# Patient Record
Sex: Male | Born: 1955
Health system: Southern US, Community
[De-identification: ages and names within clinical notes are randomized; demographics above are authoritative.]

## PROBLEM LIST (undated history)

## (undated) DIAGNOSIS — I2510 Atherosclerotic heart disease of native coronary artery without angina pectoris: Secondary | ICD-10-CM

## (undated) DIAGNOSIS — Z9289 Personal history of other medical treatment: Secondary | ICD-10-CM

## (undated) DIAGNOSIS — I255 Ischemic cardiomyopathy: Secondary | ICD-10-CM

## (undated) DIAGNOSIS — I2109 ST elevation (STEMI) myocardial infarction involving other coronary artery of anterior wall: Secondary | ICD-10-CM

## (undated) DIAGNOSIS — F32A Depression, unspecified: Secondary | ICD-10-CM

## (undated) DIAGNOSIS — K219 Gastro-esophageal reflux disease without esophagitis: Secondary | ICD-10-CM

## (undated) DIAGNOSIS — I10 Essential (primary) hypertension: Secondary | ICD-10-CM

## (undated) DIAGNOSIS — E669 Obesity, unspecified: Secondary | ICD-10-CM

## (undated) DIAGNOSIS — F329 Major depressive disorder, single episode, unspecified: Secondary | ICD-10-CM

## (undated) DIAGNOSIS — I5022 Chronic systolic (congestive) heart failure: Secondary | ICD-10-CM

## (undated) DIAGNOSIS — K859 Acute pancreatitis without necrosis or infection, unspecified: Secondary | ICD-10-CM

## (undated) DIAGNOSIS — E119 Type 2 diabetes mellitus without complications: Secondary | ICD-10-CM

## (undated) DIAGNOSIS — E785 Hyperlipidemia, unspecified: Secondary | ICD-10-CM

## (undated) DIAGNOSIS — I219 Acute myocardial infarction, unspecified: Secondary | ICD-10-CM

## (undated) HISTORY — PX: CORONARY ANGIOPLASTY WITH STENT PLACEMENT: SHX49

## (undated) HISTORY — DX: Acute pancreatitis without necrosis or infection, unspecified: K85.90

## (undated) HISTORY — PX: CARDIAC CATHETERIZATION: SHX172

## (undated) HISTORY — PX: CARDIAC VALVE REPLACEMENT: SHX585

## (undated) HISTORY — DX: Atherosclerotic heart disease of native coronary artery without angina pectoris: I25.10

## (undated) HISTORY — PX: ORCHIECTOMY: SHX2116

## (undated) HISTORY — DX: Obesity, unspecified: E66.9

## (undated) HISTORY — DX: Chronic systolic (congestive) heart failure: I50.22

## (undated) HISTORY — DX: Personal history of other medical treatment: Z92.89

## (undated) HISTORY — PX: LAPAROSCOPIC CHOLECYSTECTOMY: SUR755

## (undated) HISTORY — PX: COLONOSCOPY: SHX174

## (undated) HISTORY — DX: Gastro-esophageal reflux disease without esophagitis: K21.9

## (undated) HISTORY — DX: Hyperlipidemia, unspecified: E78.5

## (undated) HISTORY — DX: Ischemic cardiomyopathy: I25.5

---

## 2004-08-19 ENCOUNTER — Ambulatory Visit: Payer: Self-pay | Admitting: Internal Medicine

## 2004-08-26 ENCOUNTER — Ambulatory Visit: Payer: Self-pay | Admitting: Internal Medicine

## 2004-10-07 ENCOUNTER — Ambulatory Visit: Payer: Self-pay | Admitting: Internal Medicine

## 2004-11-07 ENCOUNTER — Ambulatory Visit: Payer: Self-pay | Admitting: Internal Medicine

## 2004-11-15 ENCOUNTER — Ambulatory Visit: Payer: Self-pay | Admitting: Internal Medicine

## 2008-07-31 DIAGNOSIS — K859 Acute pancreatitis without necrosis or infection, unspecified: Secondary | ICD-10-CM

## 2008-07-31 HISTORY — DX: Acute pancreatitis without necrosis or infection, unspecified: K85.90

## 2008-11-28 DIAGNOSIS — I2109 ST elevation (STEMI) myocardial infarction involving other coronary artery of anterior wall: Secondary | ICD-10-CM

## 2008-11-28 HISTORY — DX: ST elevation (STEMI) myocardial infarction involving other coronary artery of anterior wall: I21.09

## 2009-06-08 ENCOUNTER — Encounter: Admission: RE | Admit: 2009-06-08 | Discharge: 2009-06-08 | Payer: Self-pay | Admitting: Gastroenterology

## 2009-06-20 ENCOUNTER — Inpatient Hospital Stay (HOSPITAL_COMMUNITY): Admission: EM | Admit: 2009-06-20 | Discharge: 2009-06-23 | Payer: Self-pay | Admitting: Emergency Medicine

## 2009-07-13 ENCOUNTER — Ambulatory Visit (HOSPITAL_COMMUNITY): Admission: RE | Admit: 2009-07-13 | Discharge: 2009-07-13 | Payer: Self-pay | Admitting: Gastroenterology

## 2009-07-21 ENCOUNTER — Encounter (INDEPENDENT_AMBULATORY_CARE_PROVIDER_SITE_OTHER): Payer: Self-pay | Admitting: *Deleted

## 2009-07-22 ENCOUNTER — Ambulatory Visit (HOSPITAL_COMMUNITY): Admission: RE | Admit: 2009-07-22 | Discharge: 2009-07-23 | Payer: Self-pay | Admitting: Surgery

## 2009-07-22 ENCOUNTER — Encounter (INDEPENDENT_AMBULATORY_CARE_PROVIDER_SITE_OTHER): Payer: Self-pay | Admitting: Surgery

## 2009-07-23 ENCOUNTER — Emergency Department (HOSPITAL_COMMUNITY): Admission: EM | Admit: 2009-07-23 | Discharge: 2009-07-23 | Payer: Self-pay | Admitting: Emergency Medicine

## 2010-03-14 ENCOUNTER — Telehealth: Payer: Self-pay | Admitting: Internal Medicine

## 2010-08-30 NOTE — Progress Notes (Signed)
Summary: Schedule Colonoscopy  Phone Note Outgoing Call Call back at [facility] Phone [phone]   Call placed by: Harlow Mares CMA [facility]),  March 14, 2010 11:41 AM Call placed to: Patient Summary of Call: Patients number is disconnected, we will mail them a letter to remind them they are due for their procedure and they need to call back and schedule.   Initial call taken by: Harlow Mares CMA [facility]),  March 14, 2010 11:41 AM

## 2010-10-31 LAB — DIFFERENTIAL
Basophils Relative: 1 % (ref 0–1)
Eosinophils Absolute: 0 10*3/uL (ref 0.0–0.7)
Eosinophils Relative: 0 % (ref 0–5)
Monocytes Absolute: 1.1 10*3/uL — ABNORMAL HIGH (ref 0.1–1.0)
Monocytes Relative: 7 % (ref 3–12)
Neutrophils Relative %: 80 % — ABNORMAL HIGH (ref 43–77)

## 2010-10-31 LAB — COMPREHENSIVE METABOLIC PANEL
AST: 25 U/L (ref 0–37)
Albumin: 3.9 g/dL (ref 3.5–5.2)
Chloride: 105 mEq/L (ref 96–112)
Creatinine, Ser: 0.86 mg/dL (ref 0.4–1.5)
GFR calc Af Amer: >60 mL/min (ref >60)
Potassium: 4.2 mEq/L (ref 3.5–5.1)
Total Bilirubin: 0.7 mg/dL (ref 0.3–1.2)
Total Protein: 7.1 g/dL (ref 6.0–8.3)

## 2010-10-31 LAB — URINALYSIS, ROUTINE W REFLEX MICROSCOPIC
Glucose, UA: NEGATIVE mg/dL
Protein, ur: NEGATIVE mg/dL
pH: 6 (ref 5.0–8.0)

## 2010-10-31 LAB — CBC
HCT: 42.3 % (ref 39.0–52.0)
Hemoglobin: 14.1 g/dL (ref 13.0–17.0)
MCHC: 33.4 g/dL (ref 30.0–36.0)
MCV: 90.1 fL (ref 78.0–100.0)
RBC: 4.69 MIL/uL (ref 4.22–5.81)

## 2010-10-31 LAB — BASIC METABOLIC PANEL
Chloride: 107 mEq/L (ref 96–112)
GFR calc Af Amer: >60 mL/min (ref >60)
Potassium: 5 mEq/L (ref 3.5–5.1)

## 2010-11-01 LAB — COMPREHENSIVE METABOLIC PANEL
Albumin: 3.6 g/dL (ref 3.5–5.2)
Alkaline Phosphatase: 76 U/L (ref 39–117)
BUN: 12 mg/dL (ref 6–23)
CO2: 26 mEq/L (ref 19–32)
Chloride: 107 mEq/L (ref 96–112)
GFR calc Af Amer: >60 mL/min (ref >60)
GFR calc non Af Amer: >60 mL/min (ref >60)
Glucose, Bld: 123 mg/dL — ABNORMAL HIGH (ref 70–99)
Potassium: 3.7 mEq/L (ref 3.5–5.1)
Total Bilirubin: 0.8 mg/dL (ref 0.3–1.2)

## 2010-11-01 LAB — DIFFERENTIAL
Basophils Absolute: 0.1 10*3/uL (ref 0.0–0.1)
Basophils Relative: 1 % (ref 0–1)
Monocytes Absolute: 0.5 10*3/uL (ref 0.1–1.0)
Neutro Abs: 2.9 10*3/uL (ref 1.7–7.7)
Neutrophils Relative %: 53 % (ref 43–77)

## 2010-11-01 LAB — TYPE AND SCREEN: ABO/RH(D): O POS

## 2010-11-01 LAB — CBC
MCHC: 33.9 g/dL (ref 30.0–36.0)
MCV: 89.1 fL (ref 78.0–100.0)
Platelets: 195 10*3/uL (ref 150–400)
RDW: 13.5 % (ref 11.5–15.5)

## 2010-11-02 LAB — COMPREHENSIVE METABOLIC PANEL
ALT: 21 U/L (ref 0–53)
AST: 17 U/L (ref 0–37)
AST: 17 U/L (ref 0–37)
AST: 33 U/L (ref 0–37)
BUN: 4 mg/dL — ABNORMAL LOW (ref 6–23)
BUN: 4 mg/dL — ABNORMAL LOW (ref 6–23)
CO2: 24 mEq/L (ref 19–32)
CO2: 25 mEq/L (ref 19–32)
CO2: 26 mEq/L (ref 19–32)
CO2: 26 mEq/L (ref 19–32)
Calcium: 8 mg/dL — ABNORMAL LOW (ref 8.4–10.5)
Calcium: 8.1 mg/dL — ABNORMAL LOW (ref 8.4–10.5)
Calcium: 8.4 mg/dL (ref 8.4–10.5)
Calcium: 8.5 mg/dL (ref 8.4–10.5)
Chloride: 102 mEq/L (ref 96–112)
Chloride: 108 mEq/L (ref 96–112)
Chloride: 111 mEq/L (ref 96–112)
Creatinine, Ser: 0.73 mg/dL (ref 0.4–1.5)
Creatinine, Ser: 0.78 mg/dL (ref 0.4–1.5)
Creatinine, Ser: 0.81 mg/dL (ref 0.4–1.5)
Creatinine, Ser: 0.88 mg/dL (ref 0.4–1.5)
GFR calc Af Amer: >60 mL/min (ref >60)
GFR calc Af Amer: >60 mL/min (ref >60)
GFR calc Af Amer: >60 mL/min (ref >60)
GFR calc non Af Amer: >60 mL/min (ref >60)
GFR calc non Af Amer: >60 mL/min (ref >60)
GFR calc non Af Amer: >60 mL/min (ref >60)
GFR calc non Af Amer: >60 mL/min (ref >60)
Glucose, Bld: 104 mg/dL — ABNORMAL HIGH (ref 70–99)
Glucose, Bld: 135 mg/dL — ABNORMAL HIGH (ref 70–99)
Glucose, Bld: 91 mg/dL (ref 70–99)
Sodium: 141 mEq/L (ref 135–145)
Total Bilirubin: 0.9 mg/dL (ref 0.3–1.2)
Total Bilirubin: 1.2 mg/dL (ref 0.3–1.2)
Total Bilirubin: 1.2 mg/dL (ref 0.3–1.2)

## 2010-11-02 LAB — CBC
Hemoglobin: 12.6 g/dL — ABNORMAL LOW (ref 13.0–17.0)
MCHC: 33.7 g/dL (ref 30.0–36.0)
MCHC: 34.4 g/dL (ref 30.0–36.0)
MCV: 90.3 fL (ref 78.0–100.0)
MCV: 90.8 fL (ref 78.0–100.0)
RBC: 4.07 MIL/uL — ABNORMAL LOW (ref 4.22–5.81)
RBC: 4.75 MIL/uL (ref 4.22–5.81)
WBC: 8.9 10*3/uL (ref 4.0–10.5)

## 2010-11-02 LAB — LIPID PANEL
Cholesterol: 113 mg/dL (ref 0–200)
HDL: 38 mg/dL — ABNORMAL LOW (ref >39)
LDL Cholesterol: 66 mg/dL (ref 0–99)
Triglycerides: 47 mg/dL (ref <150)

## 2010-11-02 LAB — LIPASE, BLOOD
Lipase: 59 U/L (ref 11–59)
Lipase: 614 U/L — ABNORMAL HIGH (ref 11–59)

## 2010-11-02 LAB — DIFFERENTIAL
Lymphocytes Relative: 8 % — ABNORMAL LOW (ref 12–46)
Monocytes Absolute: 0.4 10*3/uL (ref 0.1–1.0)
Monocytes Relative: 3 % (ref 3–12)
Neutro Abs: 11 10*3/uL — ABNORMAL HIGH (ref 1.7–7.7)
Neutrophils Relative %: 88 % — ABNORMAL HIGH (ref 43–77)

## 2011-03-16 IMAGING — RF DG ERCP WO/W SPHINCTEROTOMY
2 series · 6 of 6 positions shown · non-contrast
Comparison: CT abdomen of 06/20/2009 and ultrasound of the abdomen
of 06/23/2009

CLINICAL DATA: Possible common bile duct calculi by CT

ERCP with sphincterotomy
Fluoroscopy time of 7.1 minutes
TECHNIQUE: Multiple spot images obtained with the fluoroscopic
device and submitted for interpretation post-procedure.  ERCP was
performed by Dr. Tho.

[Series 1: cont. · 2 of 2 slices shown (1 of 2)]
[im 1/2]
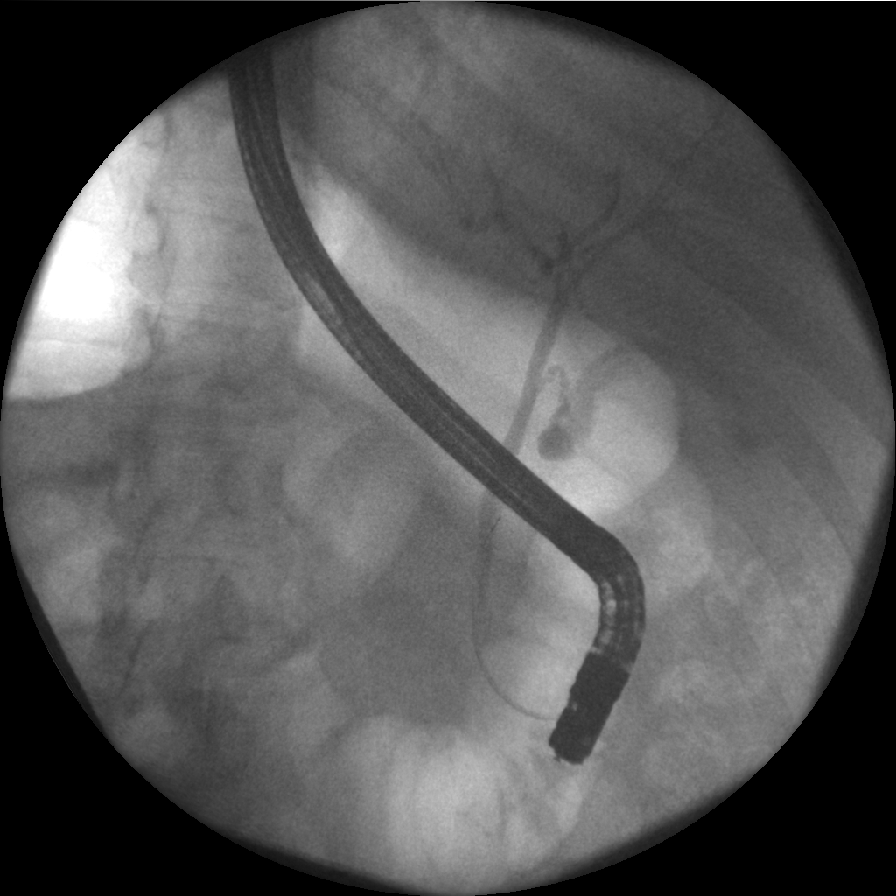
[im 2/2]
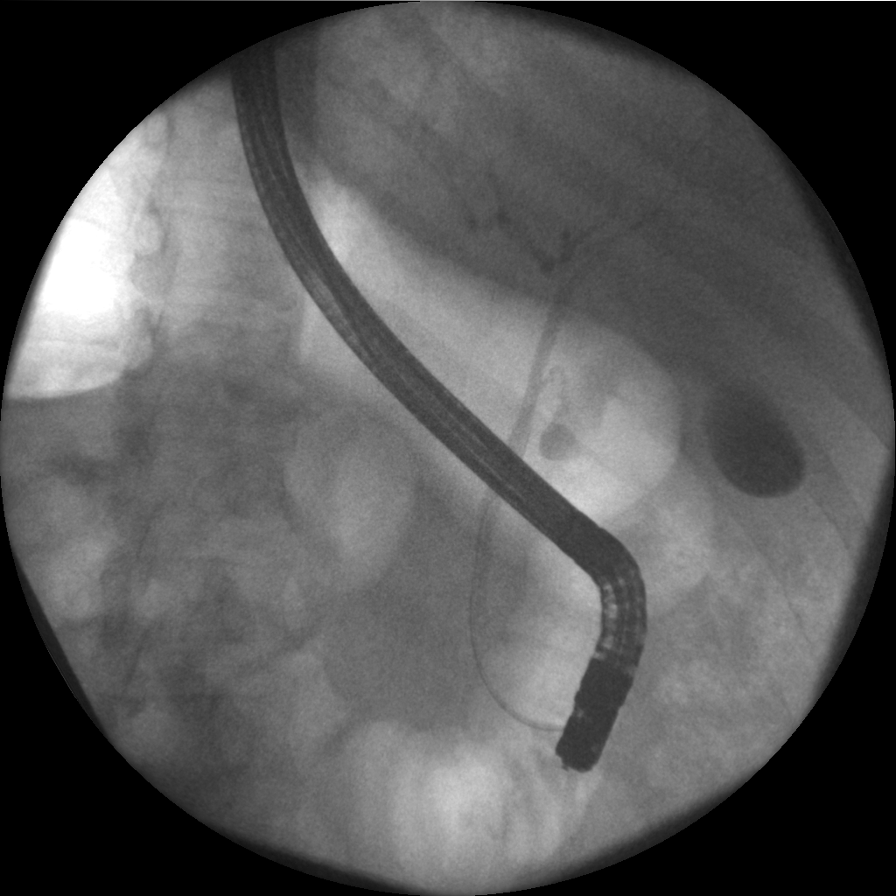

[Series 2: cont. · 4 of 4 slices shown (2 of 2)]
[im 1/4]
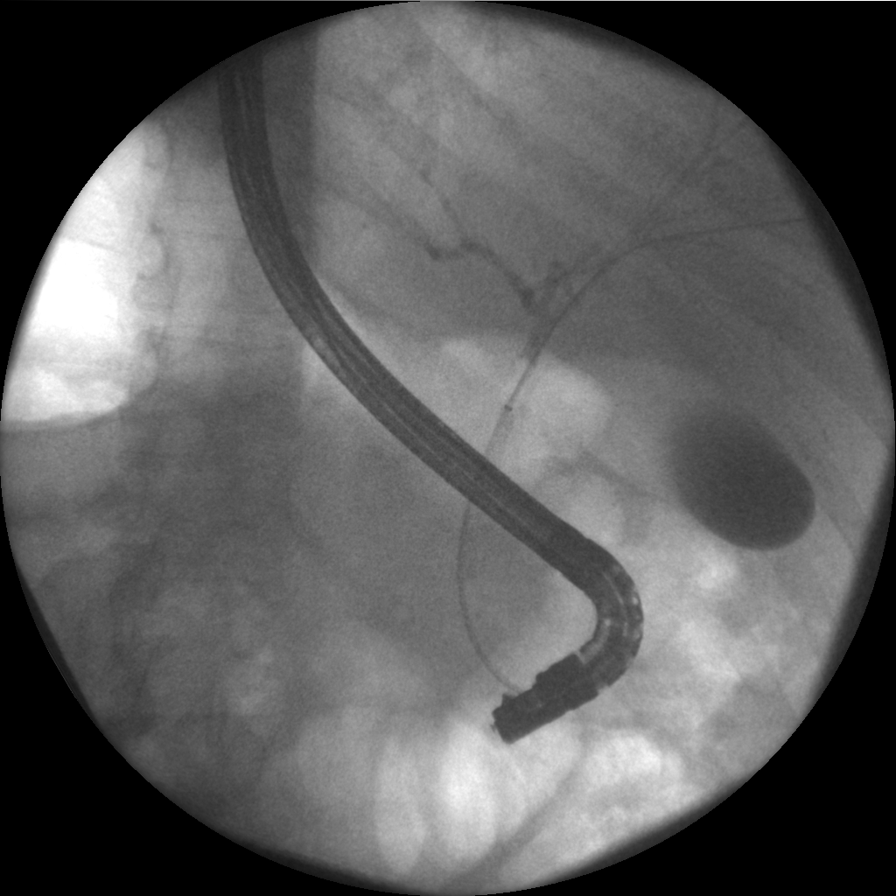
[im 2/4]
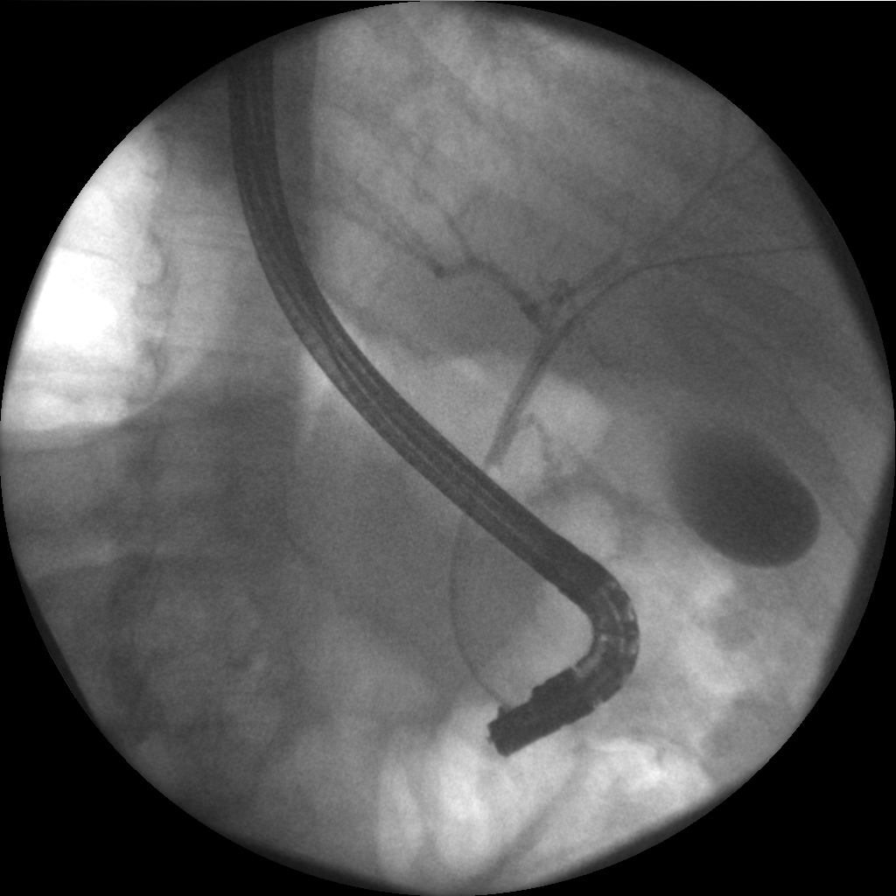
[im 3/4]
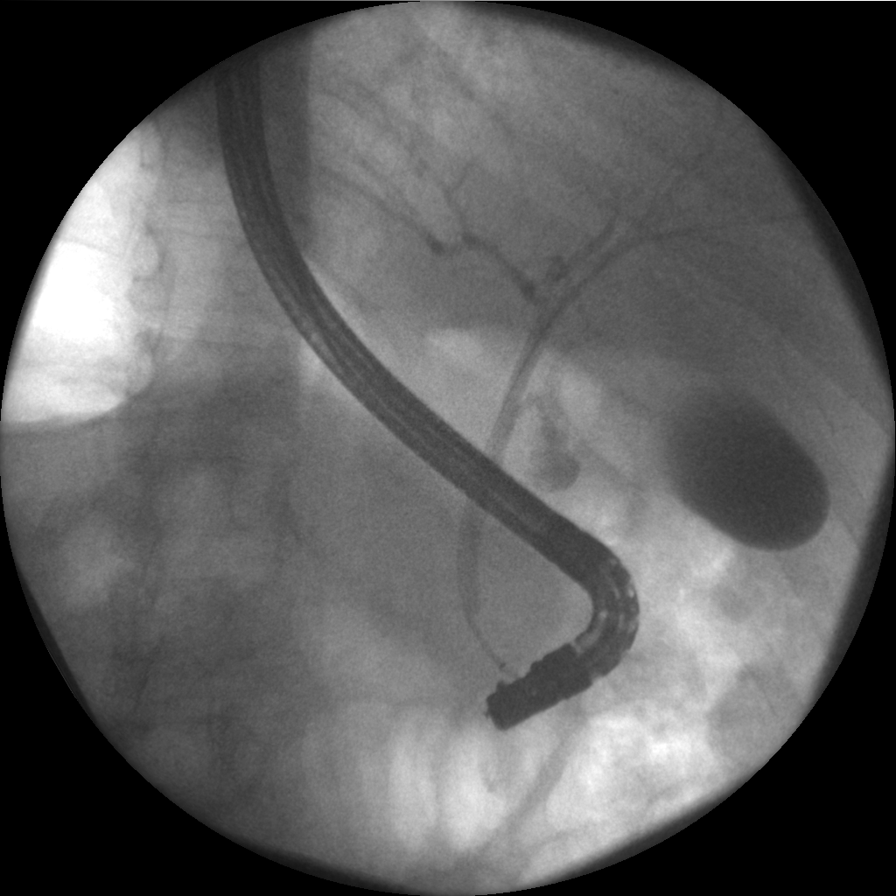
[im 4/4]
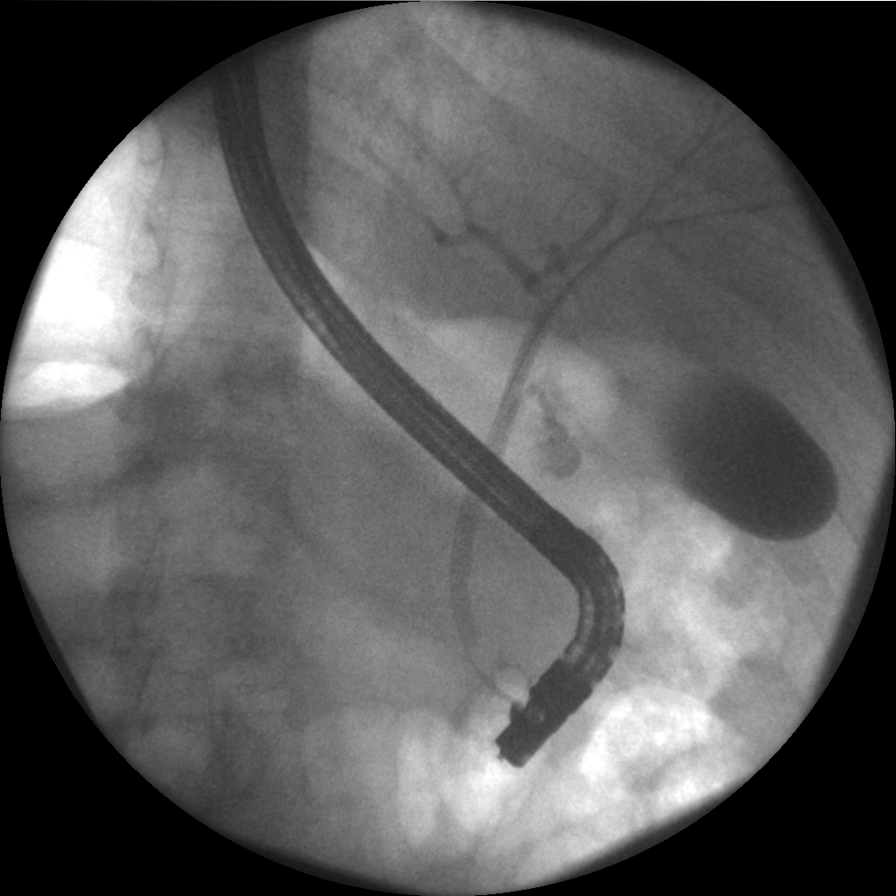

[6 of 6 positions shown; findings below may reference images not displayed]

FINDINGS: ERCP was performed and the common bile duct was
cannulated.  Contrast was injected.  No filling defect was noted.
Sphincterotomy was performed with balloon pull-through.
IMPRESSION: ERCP with sphincterotomy performed.  No definite calculi.

These images were submitted for radiologic interpretation only.
Please see the procedural report for the amount of contrast and the
fluoroscopy time utilized.

## 2012-11-08 DIAGNOSIS — N529 Male erectile dysfunction, unspecified: Secondary | ICD-10-CM | POA: Insufficient documentation

## 2012-11-08 DIAGNOSIS — R635 Abnormal weight gain: Secondary | ICD-10-CM | POA: Insufficient documentation

## 2013-01-01 ENCOUNTER — Encounter: Payer: Self-pay | Admitting: *Deleted

## 2013-01-01 ENCOUNTER — Ambulatory Visit (INDEPENDENT_AMBULATORY_CARE_PROVIDER_SITE_OTHER): Payer: BC Managed Care – PPO | Admitting: Cardiology

## 2013-01-01 ENCOUNTER — Encounter: Payer: Self-pay | Admitting: Cardiology

## 2013-01-01 VITALS — BP 120/80 | HR 69 | Wt 275.0 lb

## 2013-01-01 DIAGNOSIS — K859 Acute pancreatitis without necrosis or infection, unspecified: Secondary | ICD-10-CM | POA: Insufficient documentation

## 2013-01-01 DIAGNOSIS — E785 Hyperlipidemia, unspecified: Secondary | ICD-10-CM | POA: Insufficient documentation

## 2013-01-01 DIAGNOSIS — I2510 Atherosclerotic heart disease of native coronary artery without angina pectoris: Secondary | ICD-10-CM

## 2013-01-01 DIAGNOSIS — I208 Other forms of angina pectoris: Secondary | ICD-10-CM | POA: Insufficient documentation

## 2013-01-01 DIAGNOSIS — E669 Obesity, unspecified: Secondary | ICD-10-CM | POA: Insufficient documentation

## 2013-01-01 DIAGNOSIS — R079 Chest pain, unspecified: Secondary | ICD-10-CM | POA: Insufficient documentation

## 2013-01-01 MED ORDER — ROSUVASTATIN CALCIUM 40 MG PO TABS: 40.0000 mg | ORAL_TABLET | Freq: Every day | ORAL | Status: DC

## 2013-01-01 NOTE — Progress Notes (Signed)
  HPI: 57 year-old male for evaluation of coronary artery disease. Previously followed in [facility]. 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 He had a drug-eluting stent to his LAD in May of 2010. He apparently had nausea and vomiting and did not keep his Plavix down. He had stent thrombosis requiring a second intervention. Cardiac catheterization in September of 2010 showed a patent LAD stent. He had an ostial 40% LAD, a 70% small diagonal and an 80% small ramus. Ejection fraction 55%. Treated medically. Echocardiogram in May of 2010 showed an ejection fraction of 50% and trace tricuspid regurgitation. Patient now presents to establish. He denies dyspnea on exertion, orthopnea, PND, pedal edema, palpitations, syncope or chest pain.  Current Outpatient Prescriptions  Medication Sig Dispense Refill  . aspirin 81 MG tablet Take 81 mg by mouth daily.      . busPIRone (BUSPAR) 15 MG tablet Take 1 tablet by mouth daily.      . citalopram (CELEXA) 40 MG tablet Take 1 tablet by mouth daily. \      . CRESTOR 10 MG tablet Take 1 tablet by mouth daily.      Marland Kitchen EFFIENT 10 MG TABS Take 1 tablet by mouth daily.      . ramipril (ALTACE) 5 MG capsule Take 2 capsules by mouth daily.       No current facility-administered medications for this visit.    No Known Allergies  Past Medical History  Diagnosis Date  . CAD (coronary artery disease)   . Hyperlipidemia   . Obese   . Pancreatitis     Past Surgical History  Procedure Laterality Date  . Orchiectomy    . Cholecystectomy      History   Social History  . Marital Status: Married    Spouse Name: N/A    Number of Children: 2  . Years of Education: N/A   Occupational History  . Not on file.   Social History Main Topics  . Smoking status: Former Games developer  . Smokeless tobacco: Not on file  . Alcohol Use: Yes     Comment: Occasional  . Drug Use: No  . Sexually Active: Not on file   Other  Topics Concern  . Not on file   Social History Narrative  . No narrative on file    Family History  Problem Relation Age of Onset  . CAD Father     MI at age 11  . CAD Brother     MI at age 60    ROS: no fevers or chills, productive cough, hemoptysis, dysphasia, odynophagia, melena, hematochezia, dysuria, hematuria, rash, seizure activity, orthopnea, PND, pedal edema, claudication. Remaining systems are negative.  Physical Exam:   Blood pressure 120/80, pulse 69, weight 275 lb (124.739 kg).  General:  Well developed/well nourished in NAD Skin warm/dry Patient not depressed No peripheral clubbing Back-normal HEENT-normal/normal eyelids Neck supple/normal carotid upstroke bilaterally; no bruits; no JVD; no thyromegaly chest - CTA/ normal expansion CV - RRR/normal S1 and S2; no murmurs, rubs or gallops;  PMI nondisplaced Abdomen -NT/ND, no HSM, no mass, + bowel sounds, no bruit 2+ femoral pulses, no bruits Ext-no edema, chords, 2+ DP Neuro-grossly nonfocal  ECG sinus rhythm at a rate of 69. Prior anterior and inferior infarct.

## 2013-01-01 NOTE — Assessment & Plan Note (Addendum)
Continue aspirin and statin. It has been 4 years since his last stent was placed. Discontinue effient. Schedule Myoview for risk stratification. Continue ACE inhibitor. Check potassium and renal function.

## 2013-01-01 NOTE — Patient Instructions (Addendum)
Your physician wants you to follow-up in: ONE YEAR WITH DR Shelda Pal will receive a reminder letter in the mail two months in advance. If you don't receive a letter, please call our office to schedule the follow-up appointment.   STOP EFFIENT  INCREASE CRESTOR TO 40 MG ONCE DAILY  Your physician recommends that you return for lab work in: 6 WEEKS PRIOR TO EATING  Your physician has requested that you have en exercise stress myoview. For further information please visit [email or web address] Please follow instruction sheet, as given.

## 2013-01-01 NOTE — Assessment & Plan Note (Signed)
Change Crestor to 40 mg daily. Check lipids and liver in 6 weeks.

## 2013-01-04 DIAGNOSIS — F4329 Adjustment disorder with other symptoms: Secondary | ICD-10-CM | POA: Insufficient documentation

## 2013-02-10 ENCOUNTER — Other Ambulatory Visit: Payer: BC Managed Care – PPO

## 2013-02-10 ENCOUNTER — Encounter (HOSPITAL_COMMUNITY): Payer: BC Managed Care – PPO

## 2013-02-17 ENCOUNTER — Ambulatory Visit: Payer: BC Managed Care – PPO | Admitting: Nurse Practitioner

## 2013-02-17 ENCOUNTER — Telehealth: Payer: Self-pay | Admitting: *Deleted

## 2013-02-17 ENCOUNTER — Ambulatory Visit (INDEPENDENT_AMBULATORY_CARE_PROVIDER_SITE_OTHER): Payer: BC Managed Care – PPO | Admitting: Nurse Practitioner

## 2013-02-17 ENCOUNTER — Encounter: Payer: Self-pay | Admitting: Nurse Practitioner

## 2013-02-17 VITALS — BP 100/68 | HR 68 | Ht 75.0 in | Wt 274.1 lb

## 2013-02-17 DIAGNOSIS — I2510 Atherosclerotic heart disease of native coronary artery without angina pectoris: Secondary | ICD-10-CM

## 2013-02-17 MED ORDER — CARVEDILOL 3.125 MG PO TABS: 3.1250 mg | ORAL_TABLET | Freq: Two times a day (BID) | ORAL | Status: DC

## 2013-02-17 MED ORDER — FUROSEMIDE 20 MG PO TABS: 20.0000 mg | ORAL_TABLET | Freq: Every day | ORAL | Status: DC | PRN

## 2013-02-17 NOTE — Telephone Encounter (Signed)
S/w pt, Pt is coming in at 1:00 pm 7/21

## 2013-02-17 NOTE — Telephone Encounter (Signed)
S/w pt our computers were down and pt was coming in for an ov to discuss myoview, could not pull up test the pt will reschedule. After pulled up records did not see a myoview will send to scheduling to call to r/s. Pt aware and verbally understood

## 2013-02-17 NOTE — Progress Notes (Addendum)
Martin Frank Date of Birth: 25-Oct-1955 Medical Record #[id number]  History of Present Illness: Martin Frank is seen back today for a post hospital visit. Seen for Dr. [name]. Has known CAD  With past anterior MI complicated by VT. Had DES to the LAD in May of 2010.  Was not able to keep his Plavix down due to N/V and had stent thrombosis requiring a second intervention. Last cath in 2010. EF has been normal at 55%. Managed medically since that time.   Seen here last month to establish and was referred for a Myoview. Since his last stent had been placed 4 years prior, his Effient was stopped. His Myoview cancelled due to an admission in [place].   Comes in today. Here alone. He was admitted in [place] on July 8th with chest pain. Had positive enzymes. Was cathed. Had an echo as well. Was told that he did not need stents and that his pumping function was reduced. Was placed on aldactone and had his Plavix restarted. We do not have records. He was kept there for 2 days then released. He has not had any further chest pain. He is a little short of breath.  He is walking daily without issue. Back at work. He notes a dry hacky cough that is new since this last admission. He is on ACE. No swelling. No PND. Not dizzy or lightheaded.   Current Outpatient Prescriptions  Medication Sig Dispense Refill  . aspirin 81 MG tablet Take 81 mg by mouth daily.      . citalopram (CELEXA) 40 MG tablet Take 1 tablet by mouth daily. \      . clopidogrel (PLAVIX) 75 MG tablet Take 75 mg by mouth daily.       . famotidine (PEPCID) 20 MG tablet Take 20 mg by mouth daily.      Marland Kitchen NITROSTAT 0.4 MG SL tablet Place 0.4 mg under the tongue every 5 (five) minutes as needed for chest pain (may repeat X 3 than call 911).       . ramipril (ALTACE) 5 MG capsule Take 2 capsules by mouth daily.      . rosuvastatin (CRESTOR) 40 MG tablet Take 1 tablet (40 mg total) by mouth daily.  30 tablet  12  . spironolactone (ALDACTONE) 25 MG  tablet Take 12.5 mg by mouth daily.       . carvedilol (COREG) 3.125 MG tablet Take 1 tablet (3.125 mg total) by mouth 2 (two) times daily.  60 tablet  3  . furosemide (LASIX) 20 MG tablet Take 1 tablet (20 mg total) by mouth daily as needed. To take prn weight gain of 2 to 3 pounds overnight.  90 tablet  3   No current facility-administered medications for this visit.    No Known Allergies  Past Medical History  Diagnosis Date  . CAD (coronary artery disease)   . Hyperlipidemia   . Obese   . Pancreatitis     Past Surgical History  Procedure Laterality Date  . Orchiectomy    . Cholecystectomy      History  Smoking status  . Former Smoker  Smokeless tobacco  . Not on file    History  Alcohol Use  . Yes    Comment: Occasional    Family History  Problem Relation Age of Onset  . CAD Father     MI at age 72  . CAD Brother     MI at age 63  Review of Systems: The review of systems is per the HPI.  All other systems were reviewed and are negative.  Physical Exam: BP 100/68  Pulse 68  Ht 6\' 3"  (1.905 m)  Wt 274 lb 1.9 oz (124.34 kg)  BMI 34.26 kg/m2 BP by me is 140/100.  Patient is very pleasant and in no acute distress. He is obese. Skin is warm and dry. Color is normal.  HEENT is unremarkable. Normocephalic/atraumatic. PERRL. Sclera are nonicteric. Neck is supple. No masses. No JVD. Lungs are clear. Cardiac exam shows a regular rate and rhythm.No S3 noted.  Abdomen is soft. Extremities are without edema. Gait and ROM are intact. No gross neurologic deficits noted.  LABORATORY DATA: Lab Results  Component Value Date   WBC 16.7* 07/23/2009   HGB 14.1 07/23/2009   HCT 42.3 07/23/2009   PLT 204 07/23/2009   GLUCOSE 110* 07/23/2009   CHOL  Value: 113        ATP III CLASSIFICATION:  <200     mg/dL   Desirable  161-096  mg/dL   Borderline High  >=045    mg/dL   High        40/98/1191   TRIG 47 06/22/2009   HDL 38* 06/22/2009   LDLCALC  Value: 66        Total  Cholesterol/HDL:CHD Risk Coronary Heart Disease Risk Table                     Men   Women  1/2 Average Risk   3.4   3.3  Average Risk       5.0   4.4  2 X Average Risk   9.6   7.1  3 X Average Risk  23.4   11.0        Use the calculated Patient Ratio above and the CHD Risk Table to determine the patient's CHD Risk.        ATP III CLASSIFICATION (LDL):  <100     mg/dL   Optimal  478-295  mg/dL   Near or Above                    Optimal  130-159  mg/dL   Borderline  621-308  mg/dL   High  >657     mg/dL   Very High 84/69/6295   ALT 40 07/23/2009   AST 25 07/23/2009   NA 138 07/23/2009   K 4.2 07/23/2009   CL 105 07/23/2009   CREATININE 0.86 07/23/2009   BUN 11 07/23/2009   CO2 27 07/23/2009     Assessment / Plan: 1. CAD - remote anterior MI with VT -past DES to the LAD - now with recent MI - no reported intervention but with reported LV dysfunction - we do not have any records for review. He has signed a release today. I am starting Coreg 3.125 mg BID. Check BNP and BMET today as well. Will send in a prescription for Lasix 20 mg to use prn weight gain, swelling, etc. I will see him back in 2 weeks. We discussed salt restriction and daily weights - which he is doing.  Probably will need uptitration of his medicines over the next 3 months with plans for a repeat echo and possible ICD referral.   2. HTN - BP is up by my report. I am starting Coreg today.   3. HLD - on statin therapy.   4. Obesity   I will see him  in 2 weeks.   Patient is agreeable to this plan and will call if any problems develop in the interim.   Rosalio Macadamia, RN, ANP-C [facility] [address] [place], [place]  [address]   Addendum: February 19, 2013 Records from [facility] have been received today. Patient had a small NSTEMI with peak troponin of 2.62 likely due to small vessel disease vs spasm. Prior LAD stent was patent and mild to moderate nonobstructive disease otherwise. EF was 35 to 45% per  cath. I do not see an echo report. Aladactone was added during that admission. Beta blocker was not started due to bradycardia - which he did not exhibit at the time of my OV.   I have called for the echo report - we did receive this - it was a technically difficult study - EF was 35 to 40%.

## 2013-02-17 NOTE — Patient Instructions (Addendum)
Stay on your current medicines with the exceptions as below. The plan for you is to get you on target doses of your medicines and repeat an echo in 3 months with possible plan for an ICD.   We need to check labs today   We will get your records and I will share that with Dr. [name]  We are adding Coreg 3.125 mg two times a day  I have sent in a prescription for Lasix 20 mg to just take as needed - weight gain of 3 pounds overnight, swelling etc  Minimize the salt  I will see you in 2 weeks.   Call the [facility] office at [phone] if you have any questions, problems or concerns.

## 2013-02-18 ENCOUNTER — Telehealth: Payer: Self-pay | Admitting: *Deleted

## 2013-02-18 ENCOUNTER — Other Ambulatory Visit (INDEPENDENT_AMBULATORY_CARE_PROVIDER_SITE_OTHER): Payer: BC Managed Care – PPO

## 2013-02-18 ENCOUNTER — Other Ambulatory Visit: Payer: Self-pay | Admitting: *Deleted

## 2013-02-18 DIAGNOSIS — E785 Hyperlipidemia, unspecified: Secondary | ICD-10-CM

## 2013-02-18 DIAGNOSIS — I25810 Atherosclerosis of coronary artery bypass graft(s) without angina pectoris: Secondary | ICD-10-CM

## 2013-02-18 DIAGNOSIS — R0602 Shortness of breath: Secondary | ICD-10-CM

## 2013-02-18 LAB — BASIC METABOLIC PANEL
BUN: 14 mg/dL (ref 6–23)
CO2: 25 mEq/L (ref 19–32)
Calcium: 9.5 mg/dL (ref 8.4–10.5)
Chloride: 105 mEq/L (ref 96–112)
Creatinine, Ser: 1 mg/dL (ref 0.4–1.5)
GFR: 84.02 mL/min (ref >60.00)
Glucose, Bld: 105 mg/dL — ABNORMAL HIGH (ref 70–99)
Potassium: 4.6 mEq/L (ref 3.5–5.1)
Sodium: 137 mEq/L (ref 135–145)

## 2013-02-18 LAB — HEPATIC FUNCTION PANEL
ALT: 38 U/L (ref 0–53)
AST: 26 U/L (ref 0–37)
Albumin: 4.2 g/dL (ref 3.5–5.2)
Alkaline Phosphatase: 71 U/L (ref 39–117)
Bilirubin, Direct: 0 mg/dL (ref 0.0–0.3)
Total Bilirubin: 0.5 mg/dL (ref 0.3–1.2)
Total Protein: 7.4 g/dL (ref 6.0–8.3)

## 2013-02-18 LAB — LIPID PANEL
Cholesterol: 144 mg/dL (ref 0–200)
HDL: 49.1 mg/dL (ref >39.00)
LDL Cholesterol: 75 mg/dL (ref 0–99)
Total CHOL/HDL Ratio: 3
Triglycerides: 98 mg/dL (ref 0.0–149.0)
VLDL: 19.6 mg/dL (ref 0.0–40.0)

## 2013-02-18 LAB — BRAIN NATRIURETIC PEPTIDE: Pro B Natriuretic peptide (BNP): 13 pg/mL (ref 0.0–100.0)

## 2013-02-18 NOTE — Telephone Encounter (Signed)
S/w pt will come in today for lab work the  pt was suppose to get labs drawn yesterday at [facility] because computer were down didn't have insurance card will come in today

## 2013-02-19 ENCOUNTER — Telehealth: Payer: Self-pay | Admitting: Nurse Practitioner

## 2013-02-19 NOTE — Telephone Encounter (Signed)
Records Received From [facility] gave to [employer]  02/19/13/KM

## 2013-03-10 ENCOUNTER — Ambulatory Visit (INDEPENDENT_AMBULATORY_CARE_PROVIDER_SITE_OTHER): Payer: BC Managed Care – PPO | Admitting: Nurse Practitioner

## 2013-03-10 ENCOUNTER — Encounter: Payer: Self-pay | Admitting: Nurse Practitioner

## 2013-03-10 VITALS — BP 110/72 | HR 68 | Ht 75.0 in | Wt 274.0 lb

## 2013-03-10 DIAGNOSIS — I2510 Atherosclerotic heart disease of native coronary artery without angina pectoris: Secondary | ICD-10-CM

## 2013-03-10 LAB — BASIC METABOLIC PANEL
BUN: 14 mg/dL (ref 6–23)
CO2: 21 mEq/L (ref 19–32)
Calcium: 9.2 mg/dL (ref 8.4–10.5)
Chloride: 105 mEq/L (ref 96–112)
Creatinine, Ser: 0.9 mg/dL (ref 0.4–1.5)
GFR: 88.14 mL/min (ref >60.00)
Glucose, Bld: 115 mg/dL — ABNORMAL HIGH (ref 70–99)
Potassium: 4.1 mEq/L (ref 3.5–5.1)
Sodium: 136 mEq/L (ref 135–145)

## 2013-03-10 MED ORDER — LOSARTAN POTASSIUM 50 MG PO TABS: 50.0000 mg | ORAL_TABLET | Freq: Every day | ORAL | Status: DC

## 2013-03-10 NOTE — Patient Instructions (Addendum)
Continue with your current medicines except STOP the ramipril  Start Losartan 50 mg a day (in the place of the ramipril) - this is at the drug store  I will see you in 2 weeks  We will check lab today  Weigh yourself each morning and record.  Take your Lasix for weight gain of 3 pounds in 24 hours.   Limit sodium intake. Goal is to have less than 2000 mg (2gm) of salt per day.  Call the [facility] office at [phone] if you have any questions, problems or concerns.

## 2013-03-10 NOTE — Progress Notes (Signed)
Martin Frank Date of Birth: 1956/03/25 Medical Record #[id number]  History of Present Illness: Martin Frank is seen back today for a 3 week check. Seen for Dr. [name]. Has known CAD with past anterior MI complicated by VT. Had DES to the LAD in May of 2010. Was not able to keep his Plavix down initially due to N/V and had stent thrombosis requiring a second intervention. Last cath in 2010. Ef has been normal at 55%. Managed medically since that time.   Seen back in June to reestablish care. Myoview arranged. Effient was stopped. Then went on to have NSTEMI while in [place]. Peak troponin of 2.62. His cath showed his prior LAD stent to be widely patent with mild to moderate nonobstructive disease otherwise. It was felt he had either a coronary vasospasm event versus potential small vessel disease. Medical management was recommended and he was placed back on Plavix. EF was noted to be 35 to 40%. He was also started on low dose aldactone.   I saw him 3 weeks ago. We added Coreg and prn Lasix.   Comes back today. He is here alone. Doing ok. Still with some cough and a little short of breath. He describes his cough as a dry, hacky, "irritate you to death cough".  Weight is unchanged. Not using any Lasix. No chest pain. Back at the gym 4 to 5 x a week without any issue.    Current Outpatient Prescriptions  Medication Sig Dispense Refill  . aspirin 81 MG tablet Take 81 mg by mouth daily.      . carvedilol (COREG) 3.125 MG tablet Take 1 tablet (3.125 mg total) by mouth 2 (two) times daily.  60 tablet  3  . citalopram (CELEXA) 40 MG tablet Take 1 tablet by mouth daily. \      . clopidogrel (PLAVIX) 75 MG tablet Take 75 mg by mouth daily.       . famotidine (PEPCID) 20 MG tablet Take 20 mg by mouth daily.      . furosemide (LASIX) 20 MG tablet Take 1 tablet (20 mg total) by mouth daily as needed. To take prn weight gain of 2 to 3 pounds overnight.  90 tablet  3  . NITROSTAT 0.4 MG SL tablet Place 0.4  mg under the tongue every 5 (five) minutes as needed for chest pain (may repeat X 3 than call 911).       . rosuvastatin (CRESTOR) 40 MG tablet Take 1 tablet (40 mg total) by mouth daily.  30 tablet  12  . spironolactone (ALDACTONE) 25 MG tablet Take 12.5 mg by mouth daily.       Marland Kitchen losartan (COZAAR) 50 MG tablet Take 1 tablet (50 mg total) by mouth daily.  30 tablet  3   No current facility-administered medications for this visit.    No Known Allergies  Past Medical History  Diagnosis Date  . CAD (coronary artery disease)   . Hyperlipidemia   . Obese   . Pancreatitis     Past Surgical History  Procedure Laterality Date  . Orchiectomy    . Cholecystectomy      History  Smoking status  . Former Smoker  Smokeless tobacco  . Not on file    History  Alcohol Use  . Yes    Comment: Occasional    Family History  Problem Relation Age of Onset  . CAD Father     MI at age 60  . CAD Brother  MI at age 25    Review of Systems: The review of systems is per the HPI.  All other systems were reviewed and are negative.  Physical Exam: BP 110/72  Pulse 68  Ht 6\' 3"  (1.905 m)  Wt 274 lb (124.286 kg)  BMI 34.25 kg/m2 Patient is very pleasant and in no acute distress. Skin is warm and dry. Color is normal.  HEENT is unremarkable. Normocephalic/atraumatic. PERRL. Sclera are nonicteric. Neck is supple. No masses. No JVD. Lungs are clear. Cardiac exam shows a regular rate and rhythm. Abdomen is soft. Extremities are without edema. Gait and ROM are intact. No gross neurologic deficits noted.  LABORATORY DATA: BMET is pending  Lab Results  Component Value Date   WBC 16.7* 07/23/2009   HGB 14.1 07/23/2009   HCT 42.3 07/23/2009   PLT 204 07/23/2009   GLUCOSE 105* 02/18/2013   CHOL 144 02/18/2013   TRIG 98.0 02/18/2013   HDL 49.10 02/18/2013   LDLCALC 75 02/18/2013   ALT 38 02/18/2013   AST 26 02/18/2013   NA 137 02/18/2013   K 4.6 02/18/2013   CL 105 02/18/2013   CREATININE 1.0  02/18/2013   BUN 14 02/18/2013   CO2 25 02/18/2013     Assessment / Plan:  1. CAD with remote anterior MI with VT and past DES to the LAD - recent NSTEMI with associated LV dysfunction - his stent was patent at time of cath - this was felt to be coronary vasospasm versus small vessel disease - no chest pain reported.   2. HTN - BP looks ok. We still have room to titrate medicines  3. LV dysfunction - EF of 35 to 40% - possible ACE cough - will change to Losartan 50 mg. See him back in 2 weeks. Hope to increase Coreg on return and our goal will be to work towards target doses and repeat his echo in about 2 months.   Patient is agreeable to this plan and will call if any problems develop in the interim.   Rosalio Macadamia, RN, ANP-C [facility] [address] [place], [place]  [address]   2. LV dysfunction - EF 35 to 40% per echo at [facility] -

## 2013-03-21 ENCOUNTER — Ambulatory Visit (INDEPENDENT_AMBULATORY_CARE_PROVIDER_SITE_OTHER): Payer: BC Managed Care – PPO | Admitting: Nurse Practitioner

## 2013-03-21 ENCOUNTER — Encounter: Payer: Self-pay | Admitting: Nurse Practitioner

## 2013-03-21 VITALS — BP 110/70 | HR 60 | Ht 75.0 in | Wt 274.8 lb

## 2013-03-21 DIAGNOSIS — I519 Heart disease, unspecified: Secondary | ICD-10-CM

## 2013-03-21 DIAGNOSIS — I2510 Atherosclerotic heart disease of native coronary artery without angina pectoris: Secondary | ICD-10-CM

## 2013-03-21 NOTE — Patient Instructions (Addendum)
Stay on your current medicines for now  See Dr. [name] in 1 month (try for an early Monday am appointment)  Call the [facility] office at [phone] if you have any questions, problems or concerns.

## 2013-03-21 NOTE — Progress Notes (Signed)
Martin Frank Date of Birth: Jul 16, 1956 Medical Record #[id number]  History of Present Illness: Martin Frank is seen back today for a 2 week check. Seen for Dr. [name]. Has known CAD with past anterior MI complicated by VT. Had DES to the LAD in May of 2010. Was not able to keep his Plavix down initially due to N/V and had stent thrombosis requiring a second intervention. Last cath in 2010. EF has been normal at 55%. Managed medically since that time.   Seen back in June to reestablish care. Myoview arranged. Effient was stopped. Then went on to have NSTEMI while in [place]. Peak troponin of 2.62. His cath showed his prior LAD stent to be widely patent with mild to moderate nonobstructive disease otherwise. It was felt he had either a coronary vasospasm event versus potential small vessel disease. Medical management was recommended and he was placed back on Plavix. EF was noted to be 35 to 40%. He was also started on low dose aldactone.   In the interim we have been titrating his medicines up.  Probably had an ACE cough - switched over to ARB.   Comes back today. Here alone. Seems to be doing ok. Did travel over the past weekend - weight was up 7 pounds when he got back - he used lasix x 1 and lost 5 pounds. They did eat out every meal.  Has used the Lasix one other time since here. Not short of breath. Feels fatigued. One day thought "he was going to get dizzy". The heat seems to bother him. No chest pain reported. No swelling. No PND/orthopnea. His cough DID NOT resolve with changing to ARB.  Current Outpatient Prescriptions  Medication Sig Dispense Refill  . aspirin 81 MG tablet Take 81 mg by mouth daily.      . carvedilol (COREG) 3.125 MG tablet Take 1 tablet (3.125 mg total) by mouth 2 (two) times daily.  60 tablet  3  . citalopram (CELEXA) 40 MG tablet Take 1 tablet by mouth daily. \      . clopidogrel (PLAVIX) 75 MG tablet Take 75 mg by mouth daily.       . famotidine (PEPCID) 20 MG  tablet Take 20 mg by mouth daily.      . furosemide (LASIX) 20 MG tablet Take 1 tablet (20 mg total) by mouth daily as needed. To take prn weight gain of 2 to 3 pounds overnight.  90 tablet  3  . losartan (COZAAR) 50 MG tablet Take 1 tablet (50 mg total) by mouth daily.  30 tablet  3  . NITROSTAT 0.4 MG SL tablet Place 0.4 mg under the tongue every 5 (five) minutes as needed for chest pain (may repeat X 3 than call 911).       . rosuvastatin (CRESTOR) 40 MG tablet Take 1 tablet (40 mg total) by mouth daily.  30 tablet  12  . spironolactone (ALDACTONE) 25 MG tablet Take 12.5 mg by mouth daily.        No current facility-administered medications for this visit.    No Known Allergies  Past Medical History  Diagnosis Date  . CAD (coronary artery disease)     remote anterior MI with VT and DES to the LAD in May of 2010; NSTEMI July 2014 in [place] with cath showing stent patency, EF of 35 to 40% and possible vasospasm versus potential small vessel disease.   Marland Kitchen Hyperlipidemia   . Obese   . Pancreatitis   .  LV dysfunction     Past Surgical History  Procedure Laterality Date  . Orchiectomy    . Cholecystectomy      History  Smoking status  . Former Smoker  Smokeless tobacco  . Not on file    History  Alcohol Use  . Yes    Comment: Occasional    Family History  Problem Relation Age of Onset  . CAD Father     MI at age 54  . CAD Brother     MI at age 66    Review of Systems: The review of systems is per the HPI.  All other systems were reviewed and are negative.  Physical Exam: BP 110/70  Pulse 60  Ht 6\' 3"  (1.905 m)  Wt 274 lb 12.8 oz (124.648 kg)  BMI 34.35 kg/m2 Patient is very pleasant and in no acute distress. Weight is unchanged. Skin is warm and dry. Color is normal.  HEENT is unremarkable. Normocephalic/atraumatic. PERRL. Sclera are nonicteric. Neck is supple. No masses. No JVD. Lungs are clear. Cardiac exam shows a regular rate and rhythm. Abdomen is soft.  Extremities are without edema. Gait and ROM are intact. No gross neurologic deficits noted.  LABORATORY DATA:  Lab Results  Component Value Date   WBC 16.7* 07/23/2009   HGB 14.1 07/23/2009   HCT 42.3 07/23/2009   PLT 204 07/23/2009   GLUCOSE 115* 03/10/2013   CHOL 144 02/18/2013   TRIG 98.0 02/18/2013   HDL 49.10 02/18/2013   LDLCALC 75 02/18/2013   ALT 38 02/18/2013   AST 26 02/18/2013   NA 136 03/10/2013   K 4.1 03/10/2013   CL 105 03/10/2013   CREATININE 0.9 03/10/2013   BUN 14 03/10/2013   CO2 21 03/10/2013     Assessment / Plan:  1. CAD with remote anterior MI with VT and past DES to the LAD - recent NSTEMI with associated LV dysfunction - stent was patent on recent cath - this was felt to be coronary vasospasm versus small vessel disease - no recurrent chest pain reported.   2. HTN - BP is ok.   3. LV dysfunction - on a good group of medicines. I do not think we can titrate further at this time - without causing symptoms. Will see him back in a month. Continue with our current plan.   Patient is agreeable to this plan and will call if any problems develop in the interim.   Rosalio Macadamia, RN, ANP-C [facility] [address] [place], [place]  [address]

## 2013-04-06 DIAGNOSIS — I259 Chronic ischemic heart disease, unspecified: Secondary | ICD-10-CM | POA: Insufficient documentation

## 2013-04-06 HISTORY — DX: Chronic ischemic heart disease, unspecified: I25.9

## 2013-05-07 ENCOUNTER — Encounter: Payer: Self-pay | Admitting: Cardiology

## 2013-05-07 ENCOUNTER — Encounter (INDEPENDENT_AMBULATORY_CARE_PROVIDER_SITE_OTHER): Payer: Self-pay

## 2013-05-07 ENCOUNTER — Ambulatory Visit (INDEPENDENT_AMBULATORY_CARE_PROVIDER_SITE_OTHER): Payer: BC Managed Care – PPO | Admitting: Cardiology

## 2013-05-07 VITALS — BP 116/82 | HR 65 | Ht 75.0 in | Wt 278.8 lb

## 2013-05-07 DIAGNOSIS — I2510 Atherosclerotic heart disease of native coronary artery without angina pectoris: Secondary | ICD-10-CM

## 2013-05-07 DIAGNOSIS — I255 Ischemic cardiomyopathy: Secondary | ICD-10-CM | POA: Insufficient documentation

## 2013-05-07 DIAGNOSIS — E785 Hyperlipidemia, unspecified: Secondary | ICD-10-CM

## 2013-05-07 DIAGNOSIS — I2589 Other forms of chronic ischemic heart disease: Secondary | ICD-10-CM

## 2013-05-07 HISTORY — DX: Ischemic cardiomyopathy: I25.5

## 2013-05-07 MED ORDER — CARVEDILOL 6.25 MG PO TABS: 6.2500 mg | ORAL_TABLET | Freq: Two times a day (BID) | ORAL | Status: DC

## 2013-05-07 NOTE — Progress Notes (Signed)
HPI: FU coronary artery disease. Previously followed in [facility]. 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 He had a drug-eluting stent to his LAD in May of 2010. He apparently had nausea and vomiting and did not keep his Plavix down. He had stent thrombosis requiring a second intervention. Cardiac catheterization in September of 2010 showed a patent LAD stent. He had an ostial 40% LAD, a 70% small diagonal and an 80% small ramus. Ejection fraction 55%. Treated medically. Echocardiogram in May of 2010 showed an ejection fraction of 50% and trace tricuspid regurgitation. Patient had a small non-ST elevation myocardial infarction in July of 2014 in [place]. Peak troponin 2.62. Echo showed ejection fraction 35-40%. Cath revealed patent LAD stent with mild to moderate nonobstructive disease otherwise. Ejection fraction 35-40%. Since he was last seen, the patient has dyspnea with more extreme activities but not with routine activities. It is relieved with rest. It is not associated with chest pain. There is no orthopnea, PND or pedal edema. There is no syncope or palpitations. There is no exertional chest pain.    Current Outpatient Prescriptions  Medication Sig Dispense Refill  . aspirin 81 MG tablet Take 81 mg by mouth daily.      . carvedilol (COREG) 3.125 MG tablet Take 1 tablet (3.125 mg total) by mouth 2 (two) times daily.  60 tablet  3  . citalopram (CELEXA) 40 MG tablet Take 1 tablet by mouth daily. \      . clopidogrel (PLAVIX) 75 MG tablet Take 75 mg by mouth daily.       . famotidine (PEPCID) 20 MG tablet Take 20 mg by mouth daily.      . furosemide (LASIX) 20 MG tablet Take 1 tablet (20 mg total) by mouth daily as needed. To take prn weight gain of 2 to 3 pounds overnight.  90 tablet  3  . losartan (COZAAR) 50 MG tablet Take 1 tablet (50 mg total) by mouth daily.  30 tablet  3  . NITROSTAT 0.4 MG SL tablet Place 0.4 mg under the  tongue every 5 (five) minutes as needed for chest pain (may repeat X 3 than call 911).       . rosuvastatin (CRESTOR) 40 MG tablet Take 1 tablet (40 mg total) by mouth daily.  30 tablet  12  . spironolactone (ALDACTONE) 25 MG tablet Take 12.5 mg by mouth daily.        No current facility-administered medications for this visit.     Past Medical History  Diagnosis Date  . CAD (coronary artery disease)     remote anterior MI with VT and DES to the LAD in May of 2010; NSTEMI July 2014 in [place] with cath showing stent patency, EF of 35 to 40% and possible vasospasm versus potential small vessel disease.   Marland Kitchen Hyperlipidemia   . Obese   . Pancreatitis   . LV dysfunction     Past Surgical History  Procedure Laterality Date  . Orchiectomy    . Cholecystectomy      History   Social History  . Marital Status: Married    Spouse Name: N/A    Number of Children: 2  . Years of Education: N/A   Occupational History  . Not on file.   Social History Main Topics  . Smoking status: Former Games developer  . Smokeless tobacco: Not on file  . Alcohol Use: Yes  Comment: Occasional  . Drug Use: No  . Sexual Activity: Not Currently   Other Topics Concern  . Not on file   Social History Narrative  . No narrative on file    ROS: no fevers or chills, productive cough, hemoptysis, dysphasia, odynophagia, melena, hematochezia, dysuria, hematuria, rash, seizure activity, orthopnea, PND, pedal edema, claudication. Remaining systems are negative.  Physical Exam: Well-developed well-nourished in no acute distress.  Skin is warm and dry.  HEENT is normal.  Neck is supple.  Chest is clear to auscultation with normal expansion.  Cardiovascular exam is regular rate and rhythm.  Abdominal exam nontender or distended. No masses palpated. Extremities show no edema. neuro grossly intact  ECG sinus rhythm at a rate of 65. Prior anterior and inferior infarct.

## 2013-05-07 NOTE — Assessment & Plan Note (Signed)
Continue statin. 

## 2013-05-07 NOTE — Assessment & Plan Note (Signed)
Continue aspirin, Plavix and statin. 

## 2013-05-07 NOTE — Assessment & Plan Note (Signed)
Ejection fraction 35-40%. Plan continue ARB. Increase carvedilol to 6.25 mg by mouth twice a day. Will repeat echocardiogram in the future. He does not meet criteria for ICD at this point.

## 2013-05-07 NOTE — Patient Instructions (Signed)
Your physician wants you to follow-up in: 6 MONTHS WITH DR CRENSHAW You will receive a reminder letter in the mail two months in advance. If you don't receive a letter, please call our office to schedule the follow-up appointment.   INCREASE CARVEDILOL TO 6.25 MG TWICE DAILY 

## 2013-07-21 ENCOUNTER — Other Ambulatory Visit: Payer: Self-pay | Admitting: Nurse Practitioner

## 2013-07-31 DIAGNOSIS — Z9289 Personal history of other medical treatment: Secondary | ICD-10-CM

## 2013-07-31 HISTORY — DX: Personal history of other medical treatment: Z92.89

## 2013-11-13 ENCOUNTER — Ambulatory Visit: Payer: BC Managed Care – PPO | Admitting: Cardiology

## 2013-11-25 ENCOUNTER — Ambulatory Visit: Payer: BC Managed Care – PPO | Admitting: Physician Assistant

## 2013-12-08 ENCOUNTER — Encounter: Payer: Self-pay | Admitting: Physician Assistant

## 2013-12-08 ENCOUNTER — Ambulatory Visit (INDEPENDENT_AMBULATORY_CARE_PROVIDER_SITE_OTHER): Payer: BC Managed Care – PPO | Admitting: Physician Assistant

## 2013-12-08 VITALS — BP 110/82 | HR 65 | Ht 74.0 in | Wt 287.0 lb

## 2013-12-08 DIAGNOSIS — R0602 Shortness of breath: Secondary | ICD-10-CM

## 2013-12-08 DIAGNOSIS — I2510 Atherosclerotic heart disease of native coronary artery without angina pectoris: Secondary | ICD-10-CM

## 2013-12-08 DIAGNOSIS — I5022 Chronic systolic (congestive) heart failure: Secondary | ICD-10-CM

## 2013-12-08 DIAGNOSIS — I2589 Other forms of chronic ischemic heart disease: Secondary | ICD-10-CM

## 2013-12-08 DIAGNOSIS — I255 Ischemic cardiomyopathy: Secondary | ICD-10-CM

## 2013-12-08 DIAGNOSIS — E785 Hyperlipidemia, unspecified: Secondary | ICD-10-CM

## 2013-12-08 LAB — BASIC METABOLIC PANEL
BUN: 12 mg/dL (ref 6–23)
CALCIUM: 9.2 mg/dL (ref 8.4–10.5)
CO2: 26 mEq/L (ref 19–32)
CREATININE: 0.9 mg/dL (ref 0.4–1.5)
Chloride: 106 mEq/L (ref 96–112)
GFR: 98.73 mL/min (ref >60.00)
GLUCOSE: 109 mg/dL — AB (ref 70–99)
Potassium: 4.3 mEq/L (ref 3.5–5.1)
Sodium: 138 mEq/L (ref 135–145)

## 2013-12-08 LAB — CBC WITH DIFFERENTIAL/PLATELET
Basophils Absolute: 0.1 10*3/uL (ref 0.0–0.1)
Basophils Relative: 1 % (ref 0.0–3.0)
Eosinophils Absolute: 0.1 10*3/uL (ref 0.0–0.7)
Eosinophils Relative: 1.8 % (ref 0.0–5.0)
HEMATOCRIT: 45.9 % (ref 39.0–52.0)
Hemoglobin: 15.6 g/dL (ref 13.0–17.0)
LYMPHS ABS: 2.3 10*3/uL (ref 0.7–4.0)
LYMPHS PCT: 31.7 % (ref 12.0–46.0)
MCHC: 34.1 g/dL (ref 30.0–36.0)
MCV: 91.6 fl (ref 78.0–100.0)
Monocytes Absolute: 0.7 10*3/uL (ref 0.1–1.0)
Monocytes Relative: 9.3 % (ref 3.0–12.0)
Neutro Abs: 4.1 10*3/uL (ref 1.4–7.7)
Neutrophils Relative %: 56.2 % (ref 43.0–77.0)
Platelets: 197 10*3/uL (ref 150.0–400.0)
RBC: 5.02 Mil/uL (ref 4.22–5.81)
RDW: 13.2 % (ref 11.5–15.5)
WBC: 7.3 10*3/uL (ref 4.0–10.5)

## 2013-12-08 LAB — BRAIN NATRIURETIC PEPTIDE: Pro B Natriuretic peptide (BNP): 40 pg/mL (ref 0.0–100.0)

## 2013-12-08 LAB — TSH: TSH: 1.21 u[IU]/mL (ref 0.35–4.50)

## 2013-12-08 MED ORDER — FUROSEMIDE 20 MG PO TABS: 20.0000 mg | ORAL_TABLET | ORAL | Status: DC

## 2013-12-08 NOTE — Patient Instructions (Addendum)
LAB WORK TODAY; BMET, BNP, CBC W/DIFF, TSH  REPEAT BMET IN 1 WEEK DUE TO MED CHANGES  Your physician has requested that you have en exercise stress myoview. For further information please visit [email or web address] Please follow instruction sheet, as given.  Your physician has requested that you have an echocardiogram. Echocardiography is a painless test that uses sound waves to create images of your heart. It provides your doctor with information about the size and shape of your heart and how well your heart's chambers and valves are working. This procedure takes approximately one hour. There are no restrictions for this procedure.  Your physician recommends that you schedule a follow-up appointment in 2-3 WEEKS WITH DR. CRENSHAW OR SCOTT WEAVER, PAC SAME DAY DR. [name] IS IN THE OFFICE   CHANGE LASIX TO 20 MG ON MON, WED and FRI's ; may take extra 20 mg as needed for swelling and weight gain of 3 lb's x 1 day

## 2013-12-08 NOTE — Progress Notes (Signed)
[address] [place], [place]  [address] Phone: [phone] Fax:  [phone]  Date:  12/08/2013   ID:  Martin Malesimothy S Stejskal, DOB [birth year], MRN [id number]  PCP:  Aura DialsBOUSKA,DAVID E, MD  Cardiologist:  Dr. Olga MillersBrian Crenshaw      History of Present Illness: Martin Frank is a 58 y.o. male with a hx of CAD s/p prior anterior MI c/b VTAch and treated with DES to LAD in 11/2008 followed by stent thrombosis and second intervention (N&V - could not keep Plavix down), ischemic cardiomyopathy, HL.    Previously followed in [facility].  Cardiac catheterization in September of 2010 showed a patent LAD stent. He had an ostial 40% LAD, a 70% small diagonal and an 80% small ramus. Ejection fraction 55%. Treated medically. Echocardiogram in May of 2010 showed an ejection fraction of 50% and trace tricuspid regurgitation.   Patient had a small non-ST elevation myocardial [place] Peak troponin 2.62. Echo (01/2013) showed ejection fraction 35-40%, mild LAE, mild TR. Cath revealed patent LAD stent with mild to moderate nonobstructive disease otherwise. Ejection fraction 35-40%.   Last seen by Dr. Olga MillersBrian Crenshaw 04/2013.  Over the last 2 weeks, he has noted 6-7 lb weight gain and increased DOE.  He is NYHA Class 2-2b.  He notes 2-3 pillow orthopnea. Denies PND.  He has had some mild ankle edema.  No increased abdominal girth.  He denies chest pain.  However, he has noted some L arm numbness.  There has been some ? Discomfort as well.  This is not exertional.  He denies syncope.  He denies jaw discomfort.      Recent Labs: 02/18/2013: ALT 38; HDL Cholesterol 49.10; LDL (calc) 75; Pro B Natriuretic peptide (BNP) 13.0  03/10/2013: Creatinine 0.9; Potassium 4.1   Wt Readings from Last 3 Encounters:  12/08/13 287 lb (130.182 kg)  05/07/13 278 lb 12.8 oz (126.463 kg)  03/21/13 274 lb 12.8 oz (124.648 kg)     Past Medical History  Diagnosis Date  . CAD (coronary artery  disease)     remote anterior MI with VT and DES to the LAD in May of 2010; [place] with cath showing stent patency, EF of 35 to 40% and possible vasospasm versus potential small vessel disease.   Marland Kitchen. Hyperlipidemia   . Obese   . Pancreatitis   . LV dysfunction     Current Outpatient Prescriptions  Medication Sig Dispense Refill  . aspirin 81 MG tablet Take 81 mg by mouth daily.      . carvedilol (COREG) 6.25 MG tablet Take 1 tablet (6.25 mg total) by mouth 2 (two) times daily.  60 tablet  12  . citalopram (CELEXA) 40 MG tablet Take 1 tablet by mouth daily. \      . clopidogrel (PLAVIX) 75 MG tablet Take 75 mg by mouth daily.       . famotidine (PEPCID) 20 MG tablet Take 20 mg by mouth daily.      . furosemide (LASIX) 20 MG tablet Take 1 tablet (20 mg total) by mouth daily as needed. To take prn weight gain of 2 to 3 pounds overnight.  90 tablet  3  . losartan (COZAAR) 50 MG tablet TAKE 1 TABLET (50 MG TOTAL) BY MOUTH DAILY.  30 tablet  3  . NITROSTAT 0.4 MG SL tablet Place 0.4 mg under the tongue every 5 (five) minutes as needed for chest pain (  may repeat X 3 than call 911).       . rosuvastatin (CRESTOR) 40 MG tablet Take 1 tablet (40 mg total) by mouth daily.  30 tablet  12  . spironolactone (ALDACTONE) 25 MG tablet Take 12.5 mg by mouth daily.        No current facility-administered medications for this visit.    Allergies:   Review of patient's allergies indicates no known allergies.   Social History:  The patient  reports that he has quit smoking. He does not have any smokeless tobacco history on file. He reports that he drinks alcohol. He reports that he does not use illicit drugs.   Family History:  The patient's family history includes CAD in his brother and father.   ROS:  Please see the history of present illness.   He has had a NP cough. No wheezing.   All other systems reviewed and negative.   PHYSICAL EXAM: VS:  BP 110/82  Pulse 65  Ht 6\' 2"  (1.88 m)   Wt 287 lb (130.182 kg)  BMI 36.83 kg/m2 Well nourished, well developed, in no acute distress HEENT: normal Neck: no JVD Cardiac:  normal S1, S2; RRR; no murmur Lungs:  clear to auscultation bilaterally, no wheezing, rhonchi or rales Abd: soft, nontender, no hepatomegaly Ext: no edema Skin: warm and dry Neuro:  CNs 2-12 intact, no focal abnormalities noted  EKG:  NSR, HR 65, inferior Q waves, PVCs (couplet), anteroseptal Q waves, no significant change when compared to prior to     ASSESSMENT AND PLAN:  1. Shortness of breath: Etiology not entirely clear. The patient has had a sudden weight gain and has symptoms consistent with volume excess. However, on exam he does not look particularly volume overloaded. For now, I have asked him to increase his Lasix to 20 mg on Mondays, Wednesdays, Fridays. He may take an extra Lasix 20 mg if his weight increases 3 pounds in a 24-hour period. I will obtain a basic metabolic panel, BNP, CBC and TSH. In light of his ischemic cardiomyopathy, I will also obtain an echocardiogram to reassess his LV function. I will also have him undergo an ETT-Myoview to assess for ischemia. He will be brought back in close followup 2. Chronic systolic heart failure:  Obtain BNP and adjust Lasix as noted. 3. CAD (coronary artery disease):  He has had some L arm symptoms.  He had L arm symptoms in the past as his anginal equivalent.  They are not clearly exertional symptoms.  Proceed with Myoview as noted. Continue aspirin, Plavix, beta blocker, statin. 4. Cardiomyopathy, ischemic: Continue beta blocker, ARB, spironolactone.  Obtain followup echocardiogram as noted. 5. Hyperlipidemia: Continue statin. 6. Disposition: Follow up with Dr. [name] or me in 2 weeks.  Signed, Tereso Newcomer, PA-C  12/08/2013 10:27 AM

## 2013-12-09 ENCOUNTER — Telehealth: Payer: Self-pay | Admitting: *Deleted

## 2013-12-09 NOTE — Telephone Encounter (Signed)
pt notified about lab results and has bmet scheduled for 5/22.Marland Kitchenpt verbalized understanding

## 2013-12-15 ENCOUNTER — Other Ambulatory Visit: Payer: Self-pay | Admitting: Nurse Practitioner

## 2013-12-19 ENCOUNTER — Telehealth: Payer: Self-pay | Admitting: *Deleted

## 2013-12-19 ENCOUNTER — Other Ambulatory Visit (INDEPENDENT_AMBULATORY_CARE_PROVIDER_SITE_OTHER): Payer: BC Managed Care – PPO

## 2013-12-19 DIAGNOSIS — I2589 Other forms of chronic ischemic heart disease: Secondary | ICD-10-CM

## 2013-12-19 DIAGNOSIS — I255 Ischemic cardiomyopathy: Secondary | ICD-10-CM

## 2013-12-19 LAB — BASIC METABOLIC PANEL
BUN: 13 mg/dL (ref 6–23)
CALCIUM: 9.4 mg/dL (ref 8.4–10.5)
CO2: 28 mEq/L (ref 19–32)
CREATININE: 1 mg/dL (ref 0.4–1.5)
Chloride: 105 mEq/L (ref 96–112)
GFR: 83.77 mL/min (ref >60.00)
Glucose, Bld: 113 mg/dL — ABNORMAL HIGH (ref 70–99)
Potassium: 4.6 mEq/L (ref 3.5–5.1)
Sodium: 140 mEq/L (ref 135–145)

## 2013-12-19 NOTE — Telephone Encounter (Signed)
lmom labs look good, no changes to be made

## 2013-12-24 ENCOUNTER — Other Ambulatory Visit: Payer: Self-pay

## 2013-12-24 ENCOUNTER — Encounter: Payer: Self-pay | Admitting: Physician Assistant

## 2013-12-24 ENCOUNTER — Ambulatory Visit (HOSPITAL_COMMUNITY): Payer: BC Managed Care – PPO | Attending: Cardiovascular Disease | Admitting: Radiology

## 2013-12-24 DIAGNOSIS — I255 Ischemic cardiomyopathy: Secondary | ICD-10-CM

## 2013-12-24 DIAGNOSIS — I2589 Other forms of chronic ischemic heart disease: Secondary | ICD-10-CM | POA: Insufficient documentation

## 2013-12-24 DIAGNOSIS — R0602 Shortness of breath: Secondary | ICD-10-CM

## 2013-12-24 DIAGNOSIS — I2510 Atherosclerotic heart disease of native coronary artery without angina pectoris: Secondary | ICD-10-CM

## 2013-12-24 NOTE — Progress Notes (Signed)
Echocardiogram performed.  

## 2013-12-25 ENCOUNTER — Ambulatory Visit (HOSPITAL_COMMUNITY): Payer: BC Managed Care – PPO | Attending: Cardiology | Admitting: Radiology

## 2013-12-25 ENCOUNTER — Telehealth: Payer: Self-pay | Admitting: *Deleted

## 2013-12-25 VITALS — BP 124/88 | HR 55 | Ht 74.0 in | Wt 282.0 lb

## 2013-12-25 DIAGNOSIS — R079 Chest pain, unspecified: Secondary | ICD-10-CM

## 2013-12-25 DIAGNOSIS — R0602 Shortness of breath: Secondary | ICD-10-CM | POA: Insufficient documentation

## 2013-12-25 DIAGNOSIS — I2510 Atherosclerotic heart disease of native coronary artery without angina pectoris: Secondary | ICD-10-CM

## 2013-12-25 MED ORDER — TECHNETIUM TC 99M SESTAMIBI GENERIC - CARDIOLITE
10.0000 | Freq: Once | INTRAVENOUS | Status: AC | PRN
  Administered 2013-12-25: 10 via INTRAVENOUS

## 2013-12-25 MED ORDER — REGADENOSON 0.4 MG/5ML IV SOLN
0.4000 mg | Freq: Once | INTRAVENOUS | Status: AC
  Administered 2013-12-25: 0.4 mg via INTRAVENOUS

## 2013-12-25 MED ORDER — TECHNETIUM TC 99M SESTAMIBI GENERIC - CARDIOLITE
30.0000 | Freq: Once | INTRAVENOUS | Status: AC | PRN
  Administered 2013-12-25: 30 via INTRAVENOUS

## 2013-12-25 NOTE — Progress Notes (Signed)
[facility] SITE 3 NUCLEAR MED [address] [place], [place] [address] [phone]    Cardiology Nuclear Med Study  CLESSON LAWVER is a 58 y.o. male     MRN : [id number]     DOB: September 16, 1955  Procedure Date: 12/25/2013  Nuclear Med Background Indication for Stress Test:  Evaluation for Ischemia and Stent Patency History:  CAD, MI 2014, Cath (EF 55%), Stent (LAD), Echo 2014 EF 35-40%, MIP ~8 yrs ago HP (normal per pt.) Cardiac Risk Factors: Family History - CAD, History of Smoking and Lipids  Symptoms:  SOB   Nuclear Pre-Procedure Caffeine/Decaff Intake:  None> 12 hrs NPO After: 8:00pm   Lungs:  clear O2 Sat: 94% on room air. IV 0.9% NS with Angio Cath:  22g  IV Site: L Antecubital, tolerated well IV Started by:  Irean Hong, RN  Chest Size (in):  52 Cup Size: n/a  Height: 6\' 2"  (1.88 m)  Weight:  282 lb (127.914 kg)  BMI:  Body mass index is 36.19 kg/(m^2). Tech Comments:  Patient took am medications except held Coreg x 24 hrs. Irean Hong, RN.    Nuclear Med Study 1 or 2 day study: 1 day  Stress Test Type:  Treadmill/Lexiscan  Reading MD: N/A  Order Authorizing Provider:  Olga Millers,  MD, and Tereso Newcomer, PAC  Resting Radionuclide: Technetium 13m Sestamibi  Resting Radionuclide Dose: 11.0 mCi   Stress Radionuclide:  Technetium 53m Sestamibi  Stress Radionuclide Dose: 33.0 mCi           Stress Protocol Rest HR: 55 Stress HR: 103  Rest BP: 124/88 Stress BP: 124/84  Exercise Time (min): n/a METS: n/a           Dose of Adenosine (mg):  n/a Dose of Lexiscan: 0.4 mg  Dose of Atropine (mg): n/a Dose of Dobutamine: n/a mcg/kg/min (at max HR)  Stress Test Technologist: Nelson Chimes, BS-ES  Nuclear Technologist:  Domenic Polite, CNMT     Rest Procedure:  Myocardial perfusion imaging was performed at rest 45 minutes following the intravenous administration of Technetium 43m Sestamibi. Rest ECG: NSR with anterior infarct  Stress Procedure:  The  patient received IV Lexiscan 0.4 mg over 15-seconds with concurrent low level exercise and then Technetium 14m Sestamibi was injected at 30-seconds while the patient continued walking one more minute.  Quantitative spect images were obtained after a 45-minute delay.  Attempted to walk patient on Bruce Protocol but he became SOB and did not think he could make it to his target.  He still had quite a way to go.  Switched to Low Level Lexiscan.  During the infusion the patient was SOB.  This resolved in recovery.  Stress ECG: No significant change from baseline ECG  QPS Raw Data Images:  Mild diaphragmatic attenuation.  Normal left ventricular size. Stress Images:  There is a medium to large defect in the mid and apical anteroseptum, apical anterior wall, apex and apical inferior wall. Rest Images:  Comparison with the stress images reveals no significant change. Subtraction (SDS):  There is a fixed defect that is most consistent with a previous infarction. Transient Ischemic Dilatation (Normal <1.22):  0.95 Lung/Heart Ratio (Normal <0.45):  0.32  Quantitative Gated Spect Images QGS EDV:  140 ml QGS ESV:  70 ml  Impression Exercise Capacity:  Lexiscan with low level exercise. BP Response:  BP dropped slightly after patient was switched to low level lexiscan Clinical Symptoms:  There is dyspnea. ECG Impression:  NSR with anterior infarct and no ST changes during low level Lexiscan Comparison with Prior Nuclear Study: No images to compare  Overall Impression:  Intermediate risk stress nuclear study moderate to large fixed defect involving the mid and apical anteroseptum, apical anterior wall, apex and apical inferior wall with no evidence of ischemia.  This is consistent with scar from prior anterior wall MI..  LV Ejection Fraction: 50%.  LV Wall Motion: Overall low NL LV Function with distal anteroseptal hypokinesis  Signed: Armanda Magicraci Turner, MD [facility]

## 2013-12-25 NOTE — Telephone Encounter (Signed)
Follow up     Returning Martin Frank's call for test results

## 2013-12-25 NOTE — Telephone Encounter (Signed)
lmptcb for echo results 

## 2013-12-26 ENCOUNTER — Telehealth: Payer: Self-pay | Admitting: *Deleted

## 2013-12-26 ENCOUNTER — Encounter: Payer: Self-pay | Admitting: Physician Assistant

## 2013-12-26 NOTE — Telephone Encounter (Signed)
pt notified about echo results EF 45-50% improved from 01/2013 echo. Pt advised to continue current. Pt verbalized understanding to results.

## 2013-12-26 NOTE — Telephone Encounter (Signed)
Patient is returning your call. Please call back.  °

## 2013-12-26 NOTE — Telephone Encounter (Signed)
pt notified about myoview results with verbal understanding to results

## 2013-12-26 NOTE — Telephone Encounter (Signed)
lmptcb for myoview results 

## 2013-12-29 ENCOUNTER — Encounter: Payer: Self-pay | Admitting: Physician Assistant

## 2013-12-29 ENCOUNTER — Ambulatory Visit (INDEPENDENT_AMBULATORY_CARE_PROVIDER_SITE_OTHER): Payer: BC Managed Care – PPO | Admitting: Physician Assistant

## 2013-12-29 VITALS — BP 140/80 | HR 56 | Ht 74.0 in | Wt 287.0 lb

## 2013-12-29 DIAGNOSIS — I2589 Other forms of chronic ischemic heart disease: Secondary | ICD-10-CM

## 2013-12-29 DIAGNOSIS — I255 Ischemic cardiomyopathy: Secondary | ICD-10-CM

## 2013-12-29 DIAGNOSIS — I2510 Atherosclerotic heart disease of native coronary artery without angina pectoris: Secondary | ICD-10-CM

## 2013-12-29 DIAGNOSIS — I5022 Chronic systolic (congestive) heart failure: Secondary | ICD-10-CM | POA: Insufficient documentation

## 2013-12-29 DIAGNOSIS — R079 Chest pain, unspecified: Secondary | ICD-10-CM

## 2013-12-29 DIAGNOSIS — E785 Hyperlipidemia, unspecified: Secondary | ICD-10-CM

## 2013-12-29 NOTE — Patient Instructions (Signed)
I am glad you are feeling better. Weigh daily and take an extra Lasix if your weight increases 2-3 lbs in one day. Your physician recommends that you schedule a follow-up appointment in:  6 months with Dr. Olga MillersBrian Crenshaw.

## 2013-12-29 NOTE — Progress Notes (Signed)
Cardiology Office Note   Date:  12/29/2013   ID:  Martin Frank, DOB Jul 20, 1956, MRN [id number]  PCP:  Aura DialsBOUSKA,DAVID E, MD  Cardiologist:  Dr. Olga MillersBrian Crenshaw      History of Present Illness: Martin Frank is a 58 y.o. male with a hx of CAD s/p prior anterior MI c/b VTach and treated with DES to LAD in 11/2008 followed by stent thrombosis and second intervention (N&V - could not keep Plavix down), ischemic cardiomyopathy, HL.    Previously followed in [facility].  Cardiac catheterization in September of 2010 showed a patent LAD stent. He had an ostial 40% LAD, a 70% small diagonal and an 80% small ramus. Ejection fraction 55%. Treated medically. Echocardiogram in May of 2010 showed an ejection fraction of 50% and trace tricuspid regurgitation.   Patient had a small non-ST elevation myocardial infarction in July of [place]. Peak troponin 2.62. Echo (01/2013) showed ejection fraction 35-40%, mild LAE, mild TR. Cath revealed patent LAD stent with mild to moderate nonobstructive disease otherwise. Ejection fraction 35-40%.   I saw him 12/08/13 with increased dyspnea with exertion, 6-7 pound weight gain and 2-3 pillow orthopnea. I adjusted his Lasix.  Echocardiogram demonstrated slightly improved ejection fraction (45-50%) with apical hypokinesis. Myoview demonstrated anteroseptal, anterior, apical and apical inferior scar, no ischemia, EF 50%.  He returns for follow up.  He is feeling better.  Denies any significant dyspnea.  He is NYHA class 2.  Denies chest pain, orthopnea, PND, edema, syncope.     Studies:  - Myoview (12/27/11): Mid and apical anteroseptal, apical anterior, apex and apical inferior consistent with scar from previous anterior MI, no ischemia, EF 50%; intermediate risk  - Echo (12/25/11): EF 45-50%, apical HK   Recent Labs: 02/18/2013: ALT 38; HDL Cholesterol by NMR 49.10; LDL (calc) 75  12/08/2013: Hemoglobin 15.6; Pro B Natriuretic peptide (BNP) 40.0; TSH  1.21  12/19/2013: Creatinine 1.0; Potassium 4.6   Wt Readings from Last 3 Encounters:  12/29/13 287 lb (130.182 kg)  12/25/13 282 lb (127.914 kg)  12/08/13 287 lb (130.182 kg)     Past Medical History  Diagnosis Date  . CAD (coronary artery disease)     remote anterior MI with VT and DES to the LAD in May of 2010; [place] with cath showing stent patency, EF of 35 to 40% and possible vasospasm versus potential small vessel disease.   Marland Kitchen. Hyperlipidemia   . Obese   . Pancreatitis   . Ischemic cardiomyopathy     a.Echo (11/2013):  EF 45-50%, apical HK  . Chronic systolic heart failure   . Hx of cardiovascular stress test 2015    Lexiscan Myoview (11/2013):  Anteroseptal, apical anterior, apex, apical inferior scar; no ischemia, EF 50%; Intermediate Risk    Current Outpatient Prescriptions  Medication Sig Dispense Refill  . aspirin 81 MG tablet Take 81 mg by mouth daily.      . carvedilol (COREG) 6.25 MG tablet Take 1 tablet (6.25 mg total) by mouth 2 (two) times daily.  60 tablet  12  . citalopram (CELEXA) 40 MG tablet Take 1 tablet by mouth daily. \      . clopidogrel (PLAVIX) 75 MG tablet Take 75 mg by mouth daily.       . famotidine (PEPCID) 20 MG tablet Take 20 mg by mouth daily.      . furosemide (LASIX) 20 MG tablet Take 1 tablet (20 mg total) by mouth  as directed. TAKE ON MON, WED and FRI's ; can take extra prn weight gain of 2 to 3 pounds overnight.      . losartan (COZAAR) 50 MG tablet TAKE 1 TABLET BY MOUTH ONCE DAILY  30 tablet  3  . NITROSTAT 0.4 MG SL tablet Place 0.4 mg under the tongue every 5 (five) minutes as needed for chest pain (may repeat X 3 than call 911).       . rosuvastatin (CRESTOR) 40 MG tablet Take 1 tablet (40 mg total) by mouth daily.  30 tablet  12  . spironolactone (ALDACTONE) 25 MG tablet Take 12.5 mg by mouth daily.        No current facility-administered medications for this visit.    Allergies:   Review of patient's allergies  indicates no known allergies.   Social History:  The patient  reports that he has quit smoking. He does not have any smokeless tobacco history on file. He reports that he drinks alcohol. He reports that he does not use illicit drugs.   Family History:  The patient's family history includes CAD in his brother and father.   ROS:  Please see the history of present illness.    All other systems reviewed and negative.   PHYSICAL EXAM: VS:  BP 140/80  Pulse 56  Ht 6\' 2"  (1.88 m)  Wt 287 lb (130.182 kg)  BMI 36.83 kg/m2 Well nourished, well developed, in no acute distress HEENT: normal Neck: no JVD Cardiac:  normal S1, S2; RRR; no murmur Lungs:  clear to auscultation bilaterally, no wheezing, rhonchi or rales Abd: soft, nontender, no hepatomegaly Ext: no edema Skin: warm and dry Neuro:  CNs 2-12 intact, no focal abnormalities noted  EKG:  Sinus brady, Hr 56, inf and AS Q waves, no change from prior ECG   ASSESSMENT AND PLAN:  1. Chronic systolic heart failure:  Volume stable.  Continue current Rx.   2. CAD (coronary artery disease):  No angina.  Recent myoview with scar from prior anterior MI but no ischemia.  EF is improved.   Continue aspirin, Plavix, beta blocker, statin. 3. Cardiomyopathy, ischemic:  EF improved on recent echo.  Continue beta blocker, ARB, spironolactone.    4. Hyperlipidemia:  Continue statin. 5. Disposition: Follow up with Dr. [name] 6 mos.  Signed, Tereso Newcomer, PA-C  12/29/2013 9:02 AM

## 2014-02-02 ENCOUNTER — Other Ambulatory Visit: Payer: Self-pay | Admitting: Cardiology

## 2014-02-05 DIAGNOSIS — IMO0001 Reserved for inherently not codable concepts without codable children: Secondary | ICD-10-CM | POA: Insufficient documentation

## 2014-04-09 ENCOUNTER — Other Ambulatory Visit: Payer: Self-pay | Admitting: Nurse Practitioner

## 2014-05-18 ENCOUNTER — Other Ambulatory Visit: Payer: Self-pay | Admitting: Nurse Practitioner

## 2014-05-21 ENCOUNTER — Other Ambulatory Visit: Payer: Self-pay

## 2014-05-21 MED ORDER — CLOPIDOGREL BISULFATE 75 MG PO TABS: 75.0000 mg | ORAL_TABLET | Freq: Every day | ORAL | Status: DC

## 2014-06-18 ENCOUNTER — Other Ambulatory Visit: Payer: Self-pay | Admitting: Cardiology

## 2014-07-15 ENCOUNTER — Ambulatory Visit (INDEPENDENT_AMBULATORY_CARE_PROVIDER_SITE_OTHER): Payer: BC Managed Care – PPO | Admitting: Cardiology

## 2014-07-15 ENCOUNTER — Encounter: Payer: Self-pay | Admitting: Cardiology

## 2014-07-15 DIAGNOSIS — I2510 Atherosclerotic heart disease of native coronary artery without angina pectoris: Secondary | ICD-10-CM

## 2014-07-15 DIAGNOSIS — I2583 Coronary atherosclerosis due to lipid rich plaque: Secondary | ICD-10-CM

## 2014-07-15 DIAGNOSIS — E785 Hyperlipidemia, unspecified: Secondary | ICD-10-CM

## 2014-07-15 DIAGNOSIS — I255 Ischemic cardiomyopathy: Secondary | ICD-10-CM

## 2014-07-15 LAB — LIPID PANEL
Cholesterol: 140 mg/dL (ref 0–200)
HDL: 52 mg/dL (ref >39)
LDL CALC: 71 mg/dL (ref 0–99)
Total CHOL/HDL Ratio: 2.7 Ratio
Triglycerides: 85 mg/dL (ref <150)
VLDL: 17 mg/dL (ref 0–40)

## 2014-07-15 LAB — HEPATIC FUNCTION PANEL
ALT: 44 U/L (ref 0–53)
AST: 42 U/L — ABNORMAL HIGH (ref 0–37)
Albumin: 4.1 g/dL (ref 3.5–5.2)
Alkaline Phosphatase: 75 U/L (ref 39–117)
BILIRUBIN INDIRECT: 0.5 mg/dL (ref 0.2–1.2)
Bilirubin, Direct: 0.2 mg/dL (ref 0.0–0.3)
Total Bilirubin: 0.7 mg/dL (ref 0.2–1.2)
Total Protein: 6.7 g/dL (ref 6.0–8.3)

## 2014-07-15 LAB — BASIC METABOLIC PANEL WITH GFR
BUN: 12 mg/dL (ref 6–23)
CO2: 29 meq/L (ref 19–32)
CREATININE: 0.85 mg/dL (ref 0.50–1.35)
Calcium: 9.6 mg/dL (ref 8.4–10.5)
Chloride: 104 mEq/L (ref 96–112)
GFR, Est African American: >89 mL/min
GFR, Est Non African American: >89 mL/min
Glucose, Bld: 100 mg/dL — ABNORMAL HIGH (ref 70–99)
Potassium: 4.9 mEq/L (ref 3.5–5.3)
Sodium: 140 mEq/L (ref 135–145)

## 2014-07-15 NOTE — Assessment & Plan Note (Signed)
Continue aspirin and statin. Discontinue Plavix. 

## 2014-07-15 NOTE — Progress Notes (Signed)
HPI: FU coronary artery disease. Previously followed in [facility]. 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 He had a drug-eluting stent to his LAD in May of 2010. He apparently had nausea and vomiting and did not keep his Plavix down. He had stent thrombosis requiring a second intervention. Patient had a small non-ST elevation myocardial infarction in July of 2014 in [place]. Peak troponin 2.62. Cath revealed patent LAD stent with mild to moderate nonobstructive disease otherwise. Ejection fraction 35-40%. Echocardiogram May 2015 showed an ejection fraction of 45-50%. Nuclear study May 2015 showed an ejection fraction of 50%. There is a fixed defect in the anteroseptal region, apex and apical inferior wall. No ischemia. Since he was last seen, the patient has dyspnea with more extreme activities but not with routine activities. It is relieved with rest. It is not associated with chest pain. There is no orthopnea, PND or pedal edema. There is no syncope or palpitations. There is no exertional chest pain.  Current Outpatient Prescriptions  Medication Sig Dispense Refill  . aspirin 81 MG tablet Take 81 mg by mouth daily.    . carvedilol (COREG) 6.25 MG tablet TAKE 1 TABLET (6.25 MG TOTAL) BY MOUTH 2 (TWO) TIMES DAILY. 60 tablet 1  . citalopram (CELEXA) 40 MG tablet Take 1 tablet by mouth daily. \    . clopidogrel (PLAVIX) 75 MG tablet Take 1 tablet (75 mg total) by mouth daily. 30 tablet 6  . CRESTOR 40 MG tablet TAKE 1 TABLET (40 MG TOTAL) BY MOUTH DAILY. 30 tablet 8  . famotidine (PEPCID) 20 MG tablet Take 20 mg by mouth daily.    . furosemide (LASIX) 20 MG tablet Take 1 tablet (20 mg total) by mouth as directed. TAKE ON MON, WED and FRI's ; can take extra prn weight gain of 2 to 3 pounds overnight.    . losartan (COZAAR) 50 MG tablet TAKE 1 TABLET BY MOUTH ONCE DAILY 30 tablet 3  . NITROSTAT 0.4 MG SL tablet Place 0.4 mg under the tongue  every 5 (five) minutes as needed for chest pain (may repeat X 3 than call 911).     Marland Kitchen spironolactone (ALDACTONE) 25 MG tablet Take 12.5 mg by mouth daily.      No current facility-administered medications for this visit.     Past Medical History  Diagnosis Date  . CAD (coronary artery disease)     remote anterior MI with VT and DES to the LAD in May of 2010; NSTEMI July 2014 in [place] with cath showing stent patency, EF of 35 to 40% and possible vasospasm versus potential small vessel disease.   Marland Kitchen Hyperlipidemia   . Obese   . Pancreatitis   . Ischemic cardiomyopathy     a.Echo (11/2013):  EF 45-50%, apical HK  . Chronic systolic heart failure   . Hx of cardiovascular stress test 2015    Lexiscan Myoview (11/2013):  Anteroseptal, apical anterior, apex, apical inferior scar; no ischemia, EF 50%; Intermediate Risk    Past Surgical History  Procedure Laterality Date  . Orchiectomy    . Cholecystectomy      History   Social History  . Marital Status: Married    Spouse Name: N/A    Number of Children: 2  . Years of Education: N/A   Occupational History  . Not on file.   Social History Main Topics  . Smoking status: Former Games developer  . Smokeless  tobacco: Not on file  . Alcohol Use: Yes     Comment: Occasional  . Drug Use: No  . Sexual Activity: Not Currently   Other Topics Concern  . Not on file   Social History Narrative    ROS: no fevers or chills, productive cough, hemoptysis, dysphasia, odynophagia, melena, hematochezia, dysuria, hematuria, rash, seizure activity, orthopnea, PND, pedal edema, claudication. Remaining systems are negative.  Physical Exam: Well-developed well-nourished in no acute distress.  Skin is warm and dry.  HEENT is normal.  Neck is supple.  Chest is clear to auscultation with normal expansion.  Cardiovascular exam is regular rate and rhythm.  Abdominal exam nontender or distended. No masses palpated. Extremities show no edema. neuro  grossly intact  ECG sinus bradycardia at a rate of 50. Low voltage. Prior inferior and anterior infarct.

## 2014-07-15 NOTE — Assessment & Plan Note (Addendum)
Continue statin. Check lipids and liver. 

## 2014-07-15 NOTE — Assessment & Plan Note (Signed)
Continue beta blocker and ARB. 

## 2014-07-15 NOTE — Assessment & Plan Note (Signed)
Patient is euvolemic on examination. Continue present dose of diuretics. Check potassium and renal function. 

## 2014-07-15 NOTE — Patient Instructions (Signed)
Your physician wants you to follow-up in: ONE YEAR WITH DR CRENSHAW You will receive a reminder letter in the mail two months in advance. If you don't receive a letter, please call our office to schedule the follow-up appointment.   STOP PLAVIX 

## 2014-07-23 ENCOUNTER — Encounter: Payer: Self-pay | Admitting: *Deleted

## 2014-09-14 ENCOUNTER — Other Ambulatory Visit: Payer: Self-pay | Admitting: Physician Assistant

## 2014-09-14 ENCOUNTER — Other Ambulatory Visit: Payer: Self-pay | Admitting: Cardiology

## 2014-09-14 NOTE — Telephone Encounter (Signed)
NEEDS FOLLOW UP APPT FOR REFILLS 

## 2014-11-05 ENCOUNTER — Other Ambulatory Visit: Payer: Self-pay | Admitting: Cardiology

## 2014-11-05 NOTE — Telephone Encounter (Signed)
Lewayne Bunting, MD at 07/15/2014 10:58 AM losartan (COZAAR) 50 MG tabletTAKE 1 TABLET BY MOUTH ONCE DAILY

## 2015-01-25 ENCOUNTER — Other Ambulatory Visit: Payer: Self-pay | Admitting: Adult Health

## 2015-01-27 NOTE — Telephone Encounter (Signed)
Per note 12.16.15

## 2015-03-15 ENCOUNTER — Other Ambulatory Visit: Payer: Self-pay | Admitting: Cardiology

## 2015-03-29 DIAGNOSIS — D72829 Elevated white blood cell count, unspecified: Secondary | ICD-10-CM | POA: Insufficient documentation

## 2015-04-15 ENCOUNTER — Ambulatory Visit (INDEPENDENT_AMBULATORY_CARE_PROVIDER_SITE_OTHER): Payer: BLUE CROSS/BLUE SHIELD | Admitting: Physician Assistant

## 2015-04-15 ENCOUNTER — Encounter: Payer: Self-pay | Admitting: Physician Assistant

## 2015-04-15 VITALS — BP 120/70 | HR 60 | Ht 74.5 in | Wt 278.0 lb

## 2015-04-15 DIAGNOSIS — E669 Obesity, unspecified: Secondary | ICD-10-CM | POA: Diagnosis not present

## 2015-04-15 DIAGNOSIS — I10 Essential (primary) hypertension: Secondary | ICD-10-CM | POA: Insufficient documentation

## 2015-04-15 DIAGNOSIS — I255 Ischemic cardiomyopathy: Secondary | ICD-10-CM | POA: Diagnosis not present

## 2015-04-15 DIAGNOSIS — E785 Hyperlipidemia, unspecified: Secondary | ICD-10-CM

## 2015-04-15 DIAGNOSIS — I2510 Atherosclerotic heart disease of native coronary artery without angina pectoris: Secondary | ICD-10-CM | POA: Diagnosis not present

## 2015-04-15 DIAGNOSIS — E119 Type 2 diabetes mellitus without complications: Secondary | ICD-10-CM

## 2015-04-15 DIAGNOSIS — E1169 Type 2 diabetes mellitus with other specified complication: Secondary | ICD-10-CM | POA: Insufficient documentation

## 2015-04-15 MED ORDER — CLOPIDOGREL BISULFATE 75 MG PO TABS: 75.0000 mg | ORAL_TABLET | Freq: Every day | ORAL | Status: DC

## 2015-04-15 NOTE — Patient Instructions (Signed)
Your physician recommends that you schedule a follow-up appointment in: 3 MONTHS WITH DR CRENSHAW IN [place]  START PLAVIX 75 MG ONCE DAILY

## 2015-04-15 NOTE — Progress Notes (Signed)
Patient ID: Martin Frank, male   DOB: 1956/05/20, 59 y.o.   MRN: [id number]    Date:  04/15/2015   ID:  Martin Frank, DOB Jun 14, 1956, MRN [id number]  PCP:  [name], MD  Primary Cardiologist:  [name]   Chief complaint: Cardiac evaluation   History of Present Illness: Martin Frank is a 59 y.o. male with history of coronary artery disease, hyperlipidemia, pancreatitis, ischemic cardiomyopathy, chronic systolic heart failure.  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 He had a drug-eluting stent to his LAD in May of 2010. He apparently had nausea and vomiting and did not keep his Plavix down. He had stent thrombosis requiring a second intervention. Patient had a small non-ST elevation myocardial infarction in July of 2014 in [place]. Peak troponin 2.62. Cath revealed patent LAD stent with mild to moderate nonobstructive disease otherwise. Ejection fraction 35-40%. Echocardiogram May 2015 showed an ejection fraction of 45-50%. Nuclear study May 2015 showed an ejection fraction of 50%. There is a fixed defect in the anteroseptal region, apex and apical inferior wall. No ischemia. Lipid panel 07/15/2014: LDL 71, triglycerides 85, HDL 52.    Patient presents for cardiac evaluation.  He reports having a heart attack 2 weeks ago and being seen in [facility] regional.  Troponin peaked at 10.  Cardiac catheterization(see report below) revealed patent LAD stent site. Mild to moderate disease. FFR performed to PDA. Stable disease of ostium of Ramus. No target for PTCI.  His Cozaar was changed to lisinopril 5 mg.  He reports feeling fine but is a little tired but otherwise denies nausea, vomiting, fever, chest pain, shortness of breath, orthopnea, dizziness, PND, cough, congestion, abdominal pain, hematochezia, melena, lower extremity edema, claudication.  They checked his A1c was 6.7. He was started on metformin.  Cardiac Cath 03/29/2015 in [facility] 03/29/2015 Cardiac Diagnostic Report Demographics Patient Martin Frank Date of [birth year] Height Name Birth Patient [id number] Age 55 year(s) Weight Number Visit [id number] Gender Male BSA Number Accession [id number] HP Race Caucasian BMI Number Room 454 Date of Study 03/29/2015 Number Referring Harriett Rush Diagnostic Holley Raring, Interventional Physician Lupita Shutter, NP Physician MD Physician Procedure Procedure Type Diagnostic procedure: Complications: No Complications. Conclusions Diagnostic Procedure Summary 1. Moderate R-PDA lesion 2. Small Ramus 70-80% ostium lesion - unchanged from prior cath report 3. Prior LAD stent widely patent 4. Normal LV function Diagnostic Procedure Recommendations FFR to R-PDA Signatures Electronically signed by Holley Raring, MD(Diagnostic Physician) on 03/29/2015 11:00 Angiographic findings Cardiac Arteries and Lesion Findings LMCA: Normal. LAD: Lesion on Mid LAD: 40% stenosis. LCx: Lesion on Prox CX: 30% stenosis. RCA: Lesion on R PDA: Mid subsection.60% stenosis. FFR +----+-----------------+------------+ !FFR !Stage/Medication !Dosage(mcg) ! +----+-----------------+------------+ +-----+----------+----+ !0.94 !Adenosine !140 ! +-----+----------+----+ Ramus: Lesion on Ramus: Ostial.70% stenosis. Comments:small size artery Procedure Data Procedure Date Date: 03/29/2015 Start: 09:40 Contrast Material - Omnipaque122 ml Fluoroscopy Time: Diagnostic: 4:02 minutes. Total: 4:02 minutes. Medical History History of Disease +---------+----+--------+ !Diagnosis!Date!Comments! +---------+----+--------+ !CAD ! ! ! +---------+----+--------+ Allergies - NKA allergy. Risk Factors The patient risk factors include:Last creatinine 0.7 mg/dl. Admission Data Admission Date: 03/28/2015 Coronary [place] Dominance: Right VA LV function assessed VO:[id number]. Ejection Fraction - Method: LV gram. EF%: 55. Hemodynamics Condition: Rest Heart Rate: 24 bpm Pressures +-----+-------------+ !Site !Pressure !  +-----+-------------+ !AO !102/54 (76) ! +-----+-------------+ !AO !102/64 (81) ! +-----+-------------+ !AO !246/186 (203)! +-----+-------------+ !LV !198/86 ,21 ! +-----+-------------+  Wt Readings from Last 3 Encounters:  04/15/15 278 lb (126.1  kg)  07/15/14 277 lb (125.646 kg)  12/29/13 287 lb (130.182 kg)     Past Medical History  Diagnosis Date  . CAD (coronary artery disease)     remote anterior MI with VT and DES to the LAD in May of 2010; NSTEMI July 2014 in [place] with cath showing stent patency, EF of 35 to 40% and possible vasospasm versus potential small vessel disease.   Marland Kitchen Hyperlipidemia   . Obese   . Pancreatitis   . Ischemic cardiomyopathy     a.Echo (11/2013):  EF 45-50%, apical HK  . Chronic systolic heart failure   . Hx of cardiovascular stress test 2015    Lexiscan Myoview (11/2013):  Anteroseptal, apical anterior, apex, apical inferior scar; no ischemia, EF 50%; Intermediate Risk    Current Outpatient Prescriptions  Medication Sig Dispense Refill  . aspirin 81 MG tablet Take 81 mg by mouth daily.    . busPIRone (BUSPAR) 15 MG tablet Take 7.5 mg by mouth daily.    . carvedilol (COREG) 6.25 MG tablet TAKE 1 TABLET (6.25 MG) BY MOUTH 2 TIMES A DAY 60 tablet 6  . citalopram (CELEXA) 40 MG tablet Take 1 tablet by mouth daily. \    . CRESTOR 40 MG tablet TAKE 1 TABLET BY MOUTH DAILY 30 tablet 6  . famotidine (PEPCID) 20 MG tablet Take 20 mg by mouth daily.    . furosemide (LASIX) 20 MG tablet Take 1 tablet (20 mg total) by mouth as directed. TAKE ON MON, WED and FRI's ; can take extra prn weight gain of 2 to 3 pounds overnight.    Marland Kitchen lisinopril (PRINIVIL,ZESTRIL) 10 MG tablet Take 5 mg by mouth daily.    . metFORMIN (GLUCOPHAGE) 500 MG tablet Take 500 mg by mouth 2 (two) times daily.    . mometasone (ELOCON) 0.1 % cream as needed.    Marland Kitchen NITROSTAT 0.4 MG SL tablet Place 0.4 mg under the tongue every 5 (five) minutes as needed for chest pain (may repeat X 3 than call 911).      . clopidogrel (PLAVIX) 75 MG tablet Take 1 tablet (75 mg total) by mouth daily. 90 tablet 3   No current facility-administered medications for this visit.    Allergies:   No Known Allergies  Social History:  The patient  reports that he has quit smoking. He does not have any smokeless tobacco history on file. He reports that he drinks alcohol. He reports that he does not use illicit drugs.   Family history:   Family History  Problem Relation Age of Onset  . CAD Father     MI at age 71  . CAD Brother     MI at age 9    ROS:  Please see the history of present illness.  All other systems reviewed and negative.   PHYSICAL EXAM: VS:  BP 120/70 mmHg  Pulse 60  Ht 6' 2.5" (1.892 m)  Wt 278 lb (126.1 kg)  BMI 35.23 kg/m2 Well nourished, well developed, in no acute distress HEENT: Pupils are equal round react to light accommodation extraocular movements are intact.  Neck: no JVDNo cervical lymphadenopathy. Cardiac: Regular rate and rhythm without murmurs rubs or gallops. Lungs:  clear to auscultation bilaterally, no wheezing, rhonchi or rales Abd: soft, nontender, positive bowel sounds all quadrants, no hepatosplenomegaly Ext: no lower extremity edema.  2+ radial and dorsalis pedis pulses. Skin: warm and dry Neuro:  Grossly normal  EKG:  Normal sinus rhythm rate  60 bpm left axis deviation septal Q waves  ASSESSMENT AND PLAN:  Problem List Items Addressed This Visit    Obese   Relevant Medications   metFORMIN (GLUCOPHAGE) 500 MG tablet   Hyperlipidemia   Relevant Medications   lisinopril (PRINIVIL,ZESTRIL) 10 MG tablet   Essential hypertension   Relevant Medications   lisinopril (PRINIVIL,ZESTRIL) 10 MG tablet   Diabetes mellitus type 2 in obese   Relevant Medications   lisinopril (PRINIVIL,ZESTRIL) 10 MG tablet   metFORMIN (GLUCOPHAGE) 500 MG tablet   Cardiomyopathy, ischemic   Relevant Medications   lisinopril (PRINIVIL,ZESTRIL) 10 MG tablet   CAD (coronary  artery disease) - Primary   Relevant Medications   lisinopril (PRINIVIL,ZESTRIL) 10 MG tablet   Other Relevant Orders   EKG 12-Lead     Coronary artery disease He is on aspirin and Coreg currently, restart Plavix. Interesting he had such a significant troponin elevation without any culprit lesion. No chest pain since.  Recommended he increase his activity level slowly every week. Also recommended holding off on Viagra for the next month.  Obesity He said he is already being referred to a dietitian. I gave him contact information for our dietitian should he want to come to [facility]  Hyperlipidemia Continue Crestor. Last lipid panel was excellent see above  Essential hypertension Blood pressure well-controlled.  Ischemic myopathy Normal LV function by cardiac cath August 2016. Patient appears euvolemic. He continues to take Lasix as needed but is no longer taking spironolactone.  Diabetes mellitus Diagnosis. Started on metformin. Will follow up with PCP

## 2015-05-12 ENCOUNTER — Other Ambulatory Visit: Payer: Self-pay

## 2015-05-12 MED ORDER — NITROGLYCERIN 0.4 MG SL SUBL: 0.4000 mg | SUBLINGUAL_TABLET | SUBLINGUAL | Status: DC | PRN

## 2015-05-12 NOTE — Telephone Encounter (Signed)
Per Deliah Goody RN verbal ok to refill nitro

## 2015-07-06 ENCOUNTER — Encounter: Payer: Self-pay | Admitting: Internal Medicine

## 2015-07-12 NOTE — Progress Notes (Signed)
HPI: FU coronary artery disease. Previously followed in [facility]. 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 He had a drug-eluting stent to his LAD in May of 2010. He apparently had nausea and vomiting and did not keep his Plavix down. He had stent thrombosis requiring a second intervention. Patient had a small non-ST elevation myocardial infarction in July of 2014 in [place]. Peak troponin 2.62. Cath revealed patent LAD stent with mild to moderate nonobstructive disease otherwise. Ejection fraction 35-40%. Echocardiogram May 2015 showed an ejection fraction of 45-50%. Patient apparently had a myocardial infarction in August 2016. He had catheterization at [facility] regional which showed moderate PDA disease, small ramus with 70-80% ostial lesion and patent LAD stent. There was normal LV function. Since he was last seen, the patient denies any dyspnea on exertion, orthopnea, PND, pedal edema, palpitations, syncope or chest pain.   Current Outpatient Prescriptions  Medication Sig Dispense Refill  . aspirin 81 MG tablet Take 81 mg by mouth daily.    . busPIRone (BUSPAR) 15 MG tablet Take 7.5 mg by mouth daily.    . carvedilol (COREG) 6.25 MG tablet TAKE 1 TABLET (6.25 MG) BY MOUTH 2 TIMES A DAY 60 tablet 6  . citalopram (CELEXA) 40 MG tablet Take 1 tablet by mouth daily. \    . clopidogrel (PLAVIX) 75 MG tablet Take 1 tablet (75 mg total) by mouth daily. 90 tablet 3  . CRESTOR 40 MG tablet TAKE 1 TABLET BY MOUTH DAILY 30 tablet 6  . famotidine (PEPCID) 20 MG tablet Take 20 mg by mouth daily.    . furosemide (LASIX) 20 MG tablet Take 1 tablet (20 mg total) by mouth as directed. TAKE ON MON, WED and FRI's ; can take extra prn weight gain of 2 to 3 pounds overnight.    Marland Kitchen lisinopril (PRINIVIL,ZESTRIL) 10 MG tablet Take 5 mg by mouth daily.    . metFORMIN (GLUCOPHAGE) 500 MG tablet Take 500 mg by mouth 2 (two) times daily.    . nitroGLYCERIN  (NITROSTAT) 0.4 MG SL tablet Place 1 tablet (0.4 mg total) under the tongue every 5 (five) minutes as needed for chest pain (may repeat X 3 than call 911). 25 tablet 3   No current facility-administered medications for this visit.     Past Medical History  Diagnosis Date  . CAD (coronary artery disease)     remote anterior MI with VT and DES to the LAD in May of 2010; NSTEMI July 2014 in [place] with cath showing stent patency, EF of 35 to 40% and possible vasospasm versus potential small vessel disease.   Marland Kitchen Hyperlipidemia   . Obese   . Pancreatitis   . Ischemic cardiomyopathy     a.Echo (11/2013):  EF 45-50%, apical HK  . Chronic systolic heart failure (HCC)   . Hx of cardiovascular stress test 2015    Lexiscan Myoview (11/2013):  Anteroseptal, apical anterior, apex, apical inferior scar; no ischemia, EF 50%; Intermediate Risk    Past Surgical History  Procedure Laterality Date  . Orchiectomy    . Cholecystectomy      Social History   Social History  . Marital Status: Married    Spouse Name: N/A  . Number of Children: 2  . Years of Education: N/A   Occupational History  . Not on file.   Social History Main Topics  . Smoking status: Former Games developer  . Smokeless tobacco: Not  on file  . Alcohol Use: Yes     Comment: Occasional  . Drug Use: No  . Sexual Activity: Not Currently   Other Topics Concern  . Not on file   Social History Narrative    ROS: no fevers or chills, productive cough, hemoptysis, dysphasia, odynophagia, melena, hematochezia, dysuria, hematuria, rash, seizure activity, orthopnea, PND, pedal edema, claudication. Remaining systems are negative.  Physical Exam: Well-developed well-nourished in no acute distress.  Skin is warm and dry.  HEENT is normal.  Neck is supple.  Chest is clear to auscultation with normal expansion.  Cardiovascular exam is regular rate and rhythm.  Abdominal exam nontender or distended. No masses palpated. Extremities show  no edema. neuro grossly intact

## 2015-07-14 ENCOUNTER — Ambulatory Visit (INDEPENDENT_AMBULATORY_CARE_PROVIDER_SITE_OTHER): Payer: BLUE CROSS/BLUE SHIELD | Admitting: Cardiology

## 2015-07-14 ENCOUNTER — Encounter: Payer: Self-pay | Admitting: Cardiology

## 2015-07-14 VITALS — BP 107/74 | HR 67 | Ht 72.25 in | Wt 276.8 lb

## 2015-07-14 DIAGNOSIS — I2510 Atherosclerotic heart disease of native coronary artery without angina pectoris: Secondary | ICD-10-CM | POA: Diagnosis not present

## 2015-07-14 DIAGNOSIS — I10 Essential (primary) hypertension: Secondary | ICD-10-CM

## 2015-07-14 DIAGNOSIS — I255 Ischemic cardiomyopathy: Secondary | ICD-10-CM

## 2015-07-14 DIAGNOSIS — I2583 Coronary atherosclerosis due to lipid rich plaque: Principal | ICD-10-CM

## 2015-07-14 DIAGNOSIS — I5022 Chronic systolic (congestive) heart failure: Secondary | ICD-10-CM

## 2015-07-14 DIAGNOSIS — E785 Hyperlipidemia, unspecified: Secondary | ICD-10-CM

## 2015-07-14 NOTE — Assessment & Plan Note (Signed)
Continue statin. 

## 2015-07-14 NOTE — Assessment & Plan Note (Signed)
Continue aspirin, Plavix and statin. 

## 2015-07-14 NOTE — Patient Instructions (Signed)
Dr Crenshaw has made no changes today in your current medications or treatment plan.  Your physician recommends that you schedule a follow-up appointment in 6 months. You will receive a reminder letter in the mail two months in advance. If you don't receive a letter, please call our office to schedule the follow-up appointment.  If you need a refill on your cardiac medications before your next appointment, please call your pharmacy. 

## 2015-07-14 NOTE — Assessment & Plan Note (Signed)
Euvolemic on examination.Continue present dose of Lasix. 

## 2015-07-14 NOTE — Assessment & Plan Note (Signed)
Blood pressure controlled. Continue present medications. 

## 2015-07-14 NOTE — Assessment & Plan Note (Signed)
Continue beta blocker and ACE inhibitor. LV function normal on most recent catheterization.

## 2015-08-09 MED FILL — FUROSEMIDE 20 MG TABLET: 20 | 90 days supply | Qty: 90 | Fill #2

## 2015-08-25 MED FILL — ROSUVASTATIN CALCIUM 40 MG: 40 | 30 days supply | Qty: 30 | Fill #4

## 2015-08-25 MED FILL — CITALOPRAM HBR 40 MG TABLET: 40 | 30 days supply | Qty: 30 | Fill #4

## 2015-08-25 MED FILL — CLOPIDOGREL 75 MG TABLET: 75 | 90 days supply | Qty: 90 | Fill #1

## 2015-09-15 ENCOUNTER — Encounter: Payer: Self-pay | Admitting: Family Medicine

## 2015-09-20 MED FILL — CARVEDILOL 6.25 MG TABLET: 6.25 | 30 days supply | Qty: 60 | Fill #4

## 2015-10-05 MED FILL — metFORMIN HCL 500 MG TABS: 500 | 45 days supply | Qty: 90 | Fill #1

## 2015-10-05 MED FILL — CITALOPRAM HBR 40 MG TABLET: 40 | 30 days supply | Qty: 30 | Fill #5

## 2015-10-05 MED FILL — LISINOPRIL 10 MG TABLET: 10 | 90 days supply | Qty: 45 | Fill #1

## 2015-10-05 MED FILL — ROSUVASTATIN CALCIUM 40 MG: 40 | 30 days supply | Qty: 30 | Fill #5

## 2015-11-15 MED FILL — CARVEDILOL 6.25 MG TABLET: 6.25 | 30 days supply | Qty: 60 | Fill #5

## 2015-11-18 MED FILL — CITALOPRAM HBR 40 MG TABLET: 40 | 30 days supply | Qty: 30 | Fill #0

## 2015-11-18 MED FILL — busPIRone HCL 15 MG TABS: 15 | 30 days supply | Qty: 60 | Fill #0

## 2015-11-29 MED FILL — ROSUVASTATIN CALCIUM 40 MG: 40 | 30 days supply | Qty: 30 | Fill #6

## 2015-12-17 MED FILL — metFORMIN HCL 500 MG TABS: 500 | 90 days supply | Qty: 180 | Fill #0

## 2015-12-28 MED FILL — CLOPIDOGREL 75 MG TABLET: 75 | 90 days supply | Qty: 90 | Fill #2

## 2015-12-28 MED FILL — CITALOPRAM HBR 40 MG TABLET: 40 | 30 days supply | Qty: 30 | Fill #1

## 2015-12-28 MED FILL — CARVEDILOL 6.25 MG TABLET: 6.25 | 30 days supply | Qty: 60 | Fill #6

## 2016-01-17 ENCOUNTER — Other Ambulatory Visit: Payer: Self-pay | Admitting: Cardiology

## 2016-01-17 MED FILL — ROSUVASTATIN CALCIUM 40 MG: 40 | 30 days supply | Qty: 30 | Fill #0

## 2016-01-17 NOTE — Telephone Encounter (Signed)
Rx(s) sent to pharmacy electronically.  

## 2016-02-04 MED FILL — CITALOPRAM HBR 40 MG TABLET: 40 | 30 days supply | Qty: 30 | Fill #2

## 2016-02-09 ENCOUNTER — Other Ambulatory Visit: Payer: Self-pay | Admitting: Cardiology

## 2016-02-09 ENCOUNTER — Other Ambulatory Visit: Payer: Self-pay | Admitting: Adult Health

## 2016-02-09 MED FILL — LISINOPRIL 10 MG TABLET: 10 | 90 days supply | Qty: 45 | Fill #2

## 2016-02-09 MED FILL — FUROSEMIDE 20 MG TABLET: 20 | 90 days supply | Qty: 90 | Fill #0

## 2016-02-09 MED FILL — CARVEDILOL 6.25 MG TABLET: 6.25 | 30 days supply | Qty: 60 | Fill #0

## 2016-03-13 MED FILL — ROSUVASTATIN CALCIUM 40 MG: 40 | 30 days supply | Qty: 30 | Fill #1

## 2016-03-20 MED FILL — CITALOPRAM HBR 40 MG TABLET: 40 | 30 days supply | Qty: 30 | Fill #3

## 2016-03-20 MED FILL — CARVEDILOL 6.25 MG TABLET: 6.25 | 30 days supply | Qty: 60 | Fill #1

## 2016-04-14 MED FILL — metFORMIN HCL 500 MG TABS: 500 | 90 days supply | Qty: 180 | Fill #1

## 2016-05-02 MED FILL — CITALOPRAM HBR 40 MG TABLET: 40 | 30 days supply | Qty: 30 | Fill #0

## 2016-05-22 ENCOUNTER — Other Ambulatory Visit: Payer: Self-pay

## 2016-05-22 MED ORDER — CLOPIDOGREL BISULFATE 75 MG PO TABS: 75.0000 mg | ORAL_TABLET | Freq: Every day | ORAL | 0 refills | Status: DC

## 2016-05-22 MED FILL — CLOPIDOGREL 75 MG TABLET: 75 | 30 days supply | Qty: 30 | Fill #0

## 2016-05-22 MED FILL — CARVEDILOL 6.25 MG TABLET: 6.25 | 30 days supply | Qty: 60 | Fill #2

## 2016-06-12 MED FILL — ROSUVASTATIN CALCIUM 40 MG: 40 | 30 days supply | Qty: 30 | Fill #2

## 2016-07-04 MED FILL — CITALOPRAM HBR 40 MG TABLET: 40 | 30 days supply | Qty: 30 | Fill #1

## 2016-07-17 MED FILL — CLOPIDOGREL 75 MG TABLET: 75 | 30 days supply | Qty: 30 | Fill #1

## 2016-07-17 MED FILL — CARVEDILOL 6.25 MG TABLET: 6.25 | 30 days supply | Qty: 60 | Fill #3

## 2016-08-14 MED FILL — CITALOPRAM HBR 40 MG TABLET: 40 | 30 days supply | Qty: 30 | Fill #2

## 2016-08-30 ENCOUNTER — Other Ambulatory Visit: Payer: Self-pay | Admitting: Cardiology

## 2016-08-30 MED FILL — CLOPIDOGREL 75 MG TABLET: 75 | 30 days supply | Qty: 30 | Fill #2

## 2016-08-30 MED FILL — ROSUVASTATIN CALCIUM 40 MG: 40 | 30 days supply | Qty: 30 | Fill #0

## 2016-09-13 MED FILL — FUROSEMIDE 20 MG TABLET: 20 | 90 days supply | Qty: 90 | Fill #1

## 2016-09-13 MED FILL — CARVEDILOL 6.25 MG TABLET: 6.25 | 30 days supply | Qty: 60 | Fill #4

## 2016-09-20 ENCOUNTER — Encounter: Payer: Self-pay | Admitting: Cardiology

## 2016-09-25 MED FILL — metFORMIN HCL 500 MG TABS: 500 | 90 days supply | Qty: 180 | Fill #0

## 2016-09-27 NOTE — Progress Notes (Deleted)
HPI: FU coronary artery disease. Previously followed in [facility]. 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 He had a drug-eluting stent to his LAD in May of 2010. He apparently had nausea and vomiting and did not keep his Plavix down. He had stent thrombosis requiring a second intervention. Patient had a small non-ST elevation myocardial infarction in July of 2014 in [place]. Peak troponin 2.62. Cath revealed patent LAD stent with mild to moderate nonobstructive disease otherwise. Ejection fraction 35-40%. Echocardiogram May 2015 showed an ejection fraction of 45-50%. Patient apparently had a myocardial infarction in August 2016. He had catheterization at [facility] regional which showed moderate PDA disease, small ramus with 70-80% ostial lesion and patent LAD stent. There was normal LV function. Since he was last seen,   Current Outpatient Prescriptions  Medication Sig Dispense Refill  . aspirin 81 MG tablet Take 81 mg by mouth daily.    . busPIRone (BUSPAR) 15 MG tablet Take 7.5 mg by mouth daily.    . carvedilol (COREG) 6.25 MG tablet TAKE 1 TABLET (6.25 MG) BY MOUTH 2 TIMES A DAY 60 tablet 6  . citalopram (CELEXA) 40 MG tablet Take 1 tablet by mouth daily. \    . clopidogrel (PLAVIX) 75 MG tablet Take 1 tablet (75 mg total) by mouth daily. Please call and make an appointment for December 90 tablet 0  . famotidine (PEPCID) 20 MG tablet Take 20 mg by mouth daily.    . furosemide (LASIX) 20 MG tablet Take 1 tablet (20 mg total) by mouth as directed. TAKE ON MON, WED and FRI's ; can take extra prn weight gain of 2 to 3 pounds overnight.    . furosemide (LASIX) 20 MG tablet TAKE 1 TABLET BY MOUTH ONCE DAILY AS NEEDED FOR WEIGHT GAIN OF 2-3 POUNDS OVERNIGHT 90 tablet 4  . lisinopril (PRINIVIL,ZESTRIL) 10 MG tablet Take 5 mg by mouth daily.    . metFORMIN (GLUCOPHAGE) 500 MG tablet Take 500 mg by mouth 2 (two) times daily.    .  nitroGLYCERIN (NITROSTAT) 0.4 MG SL tablet Place 1 tablet (0.4 mg total) under the tongue every 5 (five) minutes as needed for chest pain (may repeat X 3 than call 911). 25 tablet 3  . rosuvastatin (CRESTOR) 40 MG tablet TAKE 1 TABLET BY MOUTH DAILY 30 tablet 2   No current facility-administered medications for this visit.      Past Medical History:  Diagnosis Date  . 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   . Chronic systolic heart failure (HCC)   . Hx of cardiovascular stress test 2015   Lexiscan Myoview (11/2013):  Anteroseptal, apical anterior, apex, apical inferior scar; no ischemia, EF 50%; Intermediate Risk  . Hyperlipidemia   . Ischemic cardiomyopathy    a.Echo (11/2013):  EF 45-50%, apical HK  . Obese   . Pancreatitis     Past Surgical History:  Procedure Laterality Date  . CHOLECYSTECTOMY    . ORCHIECTOMY      Social History   Social History  . Marital status: Married    Spouse name: N/A  . Number of children: 2  . Years of education: N/A   Occupational History  . Not on file.  Social History Main Topics  . Smoking status: Former Games developer  . Smokeless tobacco: Never Used  . Alcohol use Yes     Comment: Occasional  . Drug use: No  . Sexual activity: Not Currently   Other Topics Concern  . Not on file   Social History Narrative  . No narrative on file    Family History  Problem Relation Age of Onset  . CAD Father     MI at age 53  . CAD Brother     MI at age 32    ROS: no fevers or chills, productive cough, hemoptysis, dysphasia, odynophagia, melena, hematochezia, dysuria, hematuria, rash, seizure activity, orthopnea, PND, pedal edema, claudication. Remaining systems are negative.  Physical Exam: Well-developed well-nourished in no acute distress.  Skin is  warm and dry.  HEENT is normal.  Neck is supple.  Chest is clear to auscultation with normal expansion.  Cardiovascular exam is regular rate and rhythm.  Abdominal exam nontender or distended. No masses palpated. Extremities show no edema. neuro grossly intact  ECG- personally reviewed  A/P  1  Olga Millers, MD

## 2016-09-29 ENCOUNTER — Ambulatory Visit: Payer: BLUE CROSS/BLUE SHIELD | Admitting: Cardiology

## 2016-10-09 ENCOUNTER — Encounter: Payer: Self-pay | Admitting: Physician Assistant

## 2016-10-09 ENCOUNTER — Ambulatory Visit (INDEPENDENT_AMBULATORY_CARE_PROVIDER_SITE_OTHER): Payer: BLUE CROSS/BLUE SHIELD | Admitting: Physician Assistant

## 2016-10-09 VITALS — BP 134/87 | HR 56 | Ht 74.5 in | Wt 261.2 lb

## 2016-10-09 DIAGNOSIS — Z79899 Other long term (current) drug therapy: Secondary | ICD-10-CM

## 2016-10-09 DIAGNOSIS — E785 Hyperlipidemia, unspecified: Secondary | ICD-10-CM | POA: Diagnosis not present

## 2016-10-09 DIAGNOSIS — I2510 Atherosclerotic heart disease of native coronary artery without angina pectoris: Secondary | ICD-10-CM

## 2016-10-09 DIAGNOSIS — I2583 Coronary atherosclerosis due to lipid rich plaque: Secondary | ICD-10-CM | POA: Diagnosis not present

## 2016-10-09 DIAGNOSIS — R0602 Shortness of breath: Secondary | ICD-10-CM | POA: Diagnosis not present

## 2016-10-09 MED ORDER — FUROSEMIDE 20 MG PO TABS: 20.0000 mg | ORAL_TABLET | ORAL | Status: DC | PRN

## 2016-10-09 MED ORDER — ASPIRIN EC 81 MG PO TBEC: 81.0000 mg | DELAYED_RELEASE_TABLET | Freq: Every day | ORAL | Status: DC

## 2016-10-09 MED ORDER — LISINOPRIL 2.5 MG PO TABS: 2.5000 mg | ORAL_TABLET | Freq: Every day | ORAL | 3 refills | Status: DC

## 2016-10-09 MED FILL — LISINOPRIL 2.5 MG TABLET: 2.5 | 90 days supply | Qty: 90 | Fill #0

## 2016-10-09 NOTE — Addendum Note (Signed)
Addended byLisabeth Devoid, Karmine Kauer on: 10/09/2016 10:49 PM   Modules accepted: Orders

## 2016-10-09 NOTE — Patient Instructions (Signed)
Medication Instructions:  RESTART lisinopril 2.5mg  (1 tablet) once daily.  Labwork: Return for blood work in 1 WEEK (BMET)  Follow-Up: Your physician wants you to follow-up in: 6 MONTHS with Dr. [name]. You will receive a reminder letter in the mail two months in advance. If you don't receive a letter, please call our office to schedule the follow-up appointment.   Any Other Special Instructions Will Be Listed Below (If Applicable).     If you need a refill on your cardiac medications before your next appointment, please call your pharmacy.

## 2016-10-09 NOTE — Progress Notes (Addendum)
Cardiology Office Note    Date:  10/09/2016   ID:  Martin Frank, DOB [birth year], MRN [id number]  PCP:  [name], MD  Cardiologist:  Dr. [name]  Chief Complaint  Patient presents with  . Follow-up    seen for Dr. [name], h/o CAD  . Edema    in right lower leg    History of Present Illness:  Martin Frank is a 61 y.o. male with PMH of pancreatitis, HLD, ICM and CAD. Based on previous record, he had an anterior MI complicated by ventricular tachycardia in May 2010. He had a drug-eluting stent placed in LAD. He apparently had nausea and vomiting and did not keep his Plavix down, he suffered stent thrombosis requiring a second intervention. He also had NSTEMI in July 2014 in [place]. Peak troponin was 2.62. Cath revealed patent LAD stent with mild to moderate nonobstructive disease otherwise. EF 35-40%. Echocardiogram obtained in May 2015 showed ejection fraction of 45-50%.  He apparently suffered another myocardial infarction in August 2016. Cardiac catheterization at [facility] showed moderate PDA disease, small ramus with 70-80% ostial lesion, patent LAD stent. There was normal LV function. His last office visit was on 07/14/2015, unfortunately he was lost to follow-up since.  He presents today for follow-up. He has not had any issue since the previous visit. He says his normal angina is crushing chest pressure, he has not really experienced this symptom during the last year. He did stop taking his 5 mg lisinopril after he ran out of his medication in October. His blood pressure today is 134/87. EKG continued to show poor progression of R waves in the anterior lead consistent with prior history of anterior MI. Otherwise he is doing quite well. He is only taking lasix as needed. He says he take one dose of Lasix every 1-2 weeks if his weight increase by > 2 lbs overnight. He'll occasionally noticed ankle edema. Today on physical exam, he appears to be euvolemic  without any lower extremity edema. His lung is clear. I will restart 2.5 mg lisinopril as his blood pressure is only mildly elevated. He will need a basic metabolic panel in one week to assess electrolyte. He can follow-up with Dr. [name] in 6 months. I have not see any lab work for his cholesterol or liver function test since 2015, he says his primary care physician is checking them.   Past Medical History:  Diagnosis Date  . 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   . Chronic systolic heart failure (HCC)   . Hx of cardiovascular stress test 2015   Lexiscan Myoview (11/2013):  Anteroseptal, apical anterior, apex, apical inferior scar; no ischemia, EF 50%; Intermediate Risk  . Hyperlipidemia   . Ischemic cardiomyopathy    a.Echo (11/2013):  EF 45-50%, apical HK  . Obese   . Pancreatitis     Past Surgical History:  Procedure Laterality Date  . CHOLECYSTECTOMY    . ORCHIECTOMY      Current Medications: Outpatient Medications Prior to Visit  Medication Sig Dispense Refill  . carvedilol (COREG) 6.25 MG tablet TAKE 1 TABLET (6.25 MG) BY MOUTH 2 TIMES A DAY 60 tablet 6  . citalopram (CELEXA) 40 MG tablet Take 1 tablet by mouth daily. \    .  clopidogrel (PLAVIX) 75 MG tablet Take 1 tablet (75 mg total) by mouth daily. Please call and make an appointment for December 90 tablet 0  . famotidine (PEPCID) 20 MG tablet Take 20 mg by mouth daily.    . nitroGLYCERIN (NITROSTAT) 0.4 MG SL tablet Place 1 tablet (0.4 mg total) under the tongue every 5 (five) minutes as needed for chest pain (may repeat X 3 than call 911). 25 tablet 3  . rosuvastatin (CRESTOR) 40 MG tablet TAKE 1 TABLET BY MOUTH DAILY 30 tablet 2  . furosemide (LASIX) 20 MG tablet Take 1 tablet (20 mg total) by mouth as directed. TAKE ON MON, WED  and FRI's ; can take extra prn weight gain of 2 to 3 pounds overnight.    . metFORMIN (GLUCOPHAGE) 500 MG tablet Take 500 mg by mouth 2 (two) times daily.    Marland Kitchen aspirin 81 MG tablet Take 81 mg by mouth daily.    . busPIRone (BUSPAR) 15 MG tablet Take 7.5 mg by mouth daily.    . furosemide (LASIX) 20 MG tablet TAKE 1 TABLET BY MOUTH ONCE DAILY AS NEEDED FOR WEIGHT GAIN OF 2-3 POUNDS OVERNIGHT (Patient not taking: Reported on 10/09/2016) 90 tablet 4  . lisinopril (PRINIVIL,ZESTRIL) 10 MG tablet Take 5 mg by mouth daily.     No facility-administered medications prior to visit.      Allergies:   Patient has no known allergies.   Social History   Social History  . Marital status: Married    Spouse name: N/A  . Number of children: 2  . Years of education: N/A   Social History Main Topics  . Smoking status: Former Games developer  . Smokeless tobacco: Never Used  . Alcohol use Yes     Comment: Occasional  . Drug use: No  . Sexual activity: Not Currently   Other Topics Concern  . None   Social History Narrative  . None     Family History:  The patient's family history includes CAD in his brother and father.   ROS:   Please see the history of present illness.    ROS All other systems reviewed and are negative.   PHYSICAL EXAM:   VS:  BP 134/87   Pulse (!) 56   Ht 6' 2.5" (1.892 m)   Wt 261 lb 3.2 oz (118.5 kg)   BMI 33.09 kg/m    GEN: Well nourished, well developed, in no acute distress  HEENT: normal  Neck: no JVD, carotid bruits, or masses Cardiac: RRR; no murmurs, rubs, or gallops,no edema  Respiratory:  clear to auscultation bilaterally, normal work of breathing GI: soft, nontender, nondistended, + BS MS: no deformity or atrophy  Skin: warm and dry, no rash Neuro:  Alert and Oriented x 3, Strength and sensation are intact Psych: euthymic mood, full affect  Wt Readings from Last 3 Encounters:  10/09/16 261 lb 3.2 oz (118.5 kg)  07/14/15 276 lb 12.8 oz (125.6 kg)    04/15/15 278 lb (126.1 kg)      Studies/Labs Reviewed:   EKG:  EKG is ordered today.  The ekg ordered today demonstrates Normal sinus rhythm with poor R-wave progression in anterior lead.  Recent Labs: No results found for requested labs within last 8760 hours.   Lipid Panel    Component Value Date/Time   CHOL 140 07/15/2014 1203   TRIG 85 07/15/2014 1203   HDL 52 07/15/2014 1203   CHOLHDL 2.7 07/15/2014 1203   VLDL 17  07/15/2014 1203   LDLCALC 71 07/15/2014 1203    Additional studies/ records that were reviewed today include:  \Cath 2014   Echo 12/24/2013 LV EF: 45% -  50%  Study Conclusions  - Left ventricle: The cavity size was normal. Systolic function was mildly reduced. The estimated ejection fraction was in the range of 45% to 50%. Hypokinesis of the apical myocardium. Indeterminate mean left atrial pressure.   [facility] 12/25/2013 Overall Impression:  Intermediate risk stress nuclear study moderate to large fixed defect involving the mid and apical anteroseptum, apical anterior wall, apex and apical inferior wall with no evidence of ischemia.  This is consistent with scar from prior anterior wall MI..  LV Ejection Fraction: 50%.  LV Wall Motion: Overall low NL LV Function with distal anteroseptal hypokinesis  ASSESSMENT:    1. Coronary artery disease due to lipid rich plaque   2. Medication management   3. Shortness of breath   4. Hyperlipidemia, unspecified hyperlipidemia type      PLAN:  In order of problems listed above:  1. CAD: Status post stent to LAD in 2010, repeat cath both in 2014 and 2016 showed nonobstructive disease. He has not had any recurrent angina since the last visit. He seems to be doing quite well from cardiology perspective. He had a previous history of ischemic cardiomyopathy, previous echocardiogram in 2015 showed borderline low EF, however cardiac cath in 2016 showed normal EF. He has not taken any lisinopril since October  after he wound his medication, his blood pressure is only mildly elevated today, I will restart lisinopril at 2.5 mg daily. He will need a basic metabolic panel in one week. We'll continue on aspirin and Plavix, although aspirin is currently not listed as his home medication, I have confirmed with the patient that he is indeed taking it. I will add aspirin back onto his medication list.  2. Hyperlipidemia: On Crestor 40 mg daily. I do not see any lab since 2015, he says he's been followed by his primary care physician. I will defer yearly monitoring of the lipid lab and liver function test to his family physician.    Medication Adjustments/Labs and Tests Ordered: Current medicines are reviewed at length with the patient today.  Concerns regarding medicines are outlined above.  Medication changes, Labs and Tests ordered today are listed in the Patient Instructions below. Patient Instructions  Medication Instructions:  RESTART lisinopril 2.5mg  (1 tablet) once daily.  Labwork: Return for blood work in 1 WEEK (BMET)  Follow-Up: Your physician wants you to follow-up in: 6 MONTHS with Dr. [name]. You will receive a reminder letter in the mail two months in advance. If you don't receive a letter, please call our office to schedule the follow-up appointment.   Any Other Special Instructions Will Be Listed Below (If Applicable).     If you need a refill on your cardiac medications before your next appointment, please call your pharmacy.      Ramond Dial, [place]  10/09/2016 8:59 AM    [facility] [address] [place], [place], [place]  [address] Phone: [phone]; Fax: [phone]

## 2016-10-10 MED FILL — CITALOPRAM HBR 40 MG TABLET: 40 | 30 days supply | Qty: 30 | Fill #3

## 2016-10-23 LAB — BASIC METABOLIC PANEL
BUN: 10 mg/dL (ref 7–25)
CALCIUM: 9.6 mg/dL (ref 8.6–10.3)
CO2: 28 mmol/L (ref 20–31)
CREATININE: 0.89 mg/dL (ref 0.70–1.33)
Chloride: 105 mmol/L (ref 98–110)
Glucose, Bld: 133 mg/dL — ABNORMAL HIGH (ref 65–99)
Potassium: 4.7 mmol/L (ref 3.5–5.3)
Sodium: 139 mmol/L (ref 135–146)

## 2016-10-23 MED FILL — ROSUVASTATIN CALCIUM 40 MG: 40 | 30 days supply | Qty: 30 | Fill #1

## 2016-11-13 ENCOUNTER — Other Ambulatory Visit: Payer: Self-pay | Admitting: Cardiology

## 2016-11-13 MED FILL — CARVEDILOL 6.25 MG TABLET: 6.25 | 30 days supply | Qty: 60 | Fill #5

## 2016-11-14 MED FILL — CLOPIDOGREL 75 MG TABLET: 75 | 90 days supply | Qty: 90 | Fill #0

## 2016-11-14 NOTE — Telephone Encounter (Signed)
Rx(s) sent to pharmacy electronically.  

## 2016-11-30 MED FILL — CITALOPRAM HBR 40 MG TABLET: 40 | 30 days supply | Qty: 30 | Fill #4

## 2017-01-04 MED FILL — CITALOPRAM HBR 40 MG TABLET: 40 | 30 days supply | Qty: 30 | Fill #5

## 2017-01-04 MED FILL — CARVEDILOL 6.25 MG TAB: 6.25 | 30 days supply | Qty: 60 | Fill #6

## 2017-01-04 MED FILL — ROSUVASTATIN CALCIUM 40 MG: 40 | 30 days supply | Qty: 30 | Fill #2

## 2017-02-02 ENCOUNTER — Other Ambulatory Visit: Payer: Self-pay | Admitting: Cardiology

## 2017-02-02 MED FILL — CARVEDILOL 6.25 MG TAB: 6.25 | 30 days supply | Qty: 60 | Fill #0

## 2017-02-02 MED FILL — metFORMIN HCL 500 MG TABS: 500 | 90 days supply | Qty: 180 | Fill #1

## 2017-02-02 MED FILL — NITROGLYCERIN 0.4 MG TAB SL: 0.4 | 7 days supply | Qty: 25 | Fill #0

## 2017-02-20 ENCOUNTER — Other Ambulatory Visit: Payer: Self-pay | Admitting: Cardiology

## 2017-02-20 MED FILL — ROSUVASTATIN CALCIUM 40 MG: 40 | 30 days supply | Qty: 30 | Fill #0

## 2017-02-20 MED FILL — LISINOPRIL 2.5 MG TABLET: 2.5 | 90 days supply | Qty: 90 | Fill #1

## 2017-02-20 MED FILL — CITALOPRAM HBR 40 MG TABLET: 40 | 30 days supply | Qty: 30 | Fill #0

## 2017-02-20 NOTE — Telephone Encounter (Signed)
REFILL 

## 2017-03-26 MED FILL — CARVEDILOL 6.25 MG TABLET: 6.25 | 30 days supply | Qty: 60 | Fill #1

## 2017-04-05 MED FILL — CLOPIDOGREL 75 MG TABLET: 75 | 90 days supply | Qty: 90 | Fill #1

## 2017-04-10 MED FILL — CITALOPRAM HBR 40 MG TABLET: 40 | 30 days supply | Qty: 30 | Fill #1

## 2017-04-10 MED FILL — ROSUVASTATIN CALCIUM 40 MG: 40 | 30 days supply | Qty: 30 | Fill #1

## 2017-04-17 DIAGNOSIS — I2510 Atherosclerotic heart disease of native coronary artery without angina pectoris: Secondary | ICD-10-CM | POA: Insufficient documentation

## 2017-04-17 DIAGNOSIS — F339 Major depressive disorder, recurrent, unspecified: Secondary | ICD-10-CM | POA: Insufficient documentation

## 2017-04-30 ENCOUNTER — Other Ambulatory Visit: Payer: Self-pay | Admitting: Adult Health

## 2017-05-22 MED FILL — CITALOPRAM HBR 40 MG TABLET: 40 | 30 days supply | Qty: 30 | Fill #2

## 2017-05-25 ENCOUNTER — Encounter: Payer: Self-pay | Admitting: *Deleted

## 2017-05-25 ENCOUNTER — Ambulatory Visit: Payer: BLUE CROSS/BLUE SHIELD | Admitting: Physician Assistant

## 2017-05-25 NOTE — Progress Notes (Deleted)
Cardiology Office Note    Date:  05/25/2017   ID:  Martin Malesimothy S Stueve, DOB May 17, 1956, MRN [id number]  PCP:  Tracey HarriesBouska, David, MD  Cardiologist:  ***   No chief complaint on file.   History of Present Illness:  Martin Frank is a 61 y.o. male ***    Past Medical History:  Diagnosis Date  . CAD (coronary artery disease)    remote anterior MI with VT and DES to the LAD in May of 2010; NSTEMI July 2014 in [place] with cath showing stent patency, EF of 35 to 40% and possible vasospasm versus potential small vessel disease.   . Chronic systolic heart failure (HCC)   . Hx of cardiovascular stress test 2015   Lexiscan Myoview (11/2013):  Anteroseptal, apical anterior, apex, apical inferior scar; no ischemia, EF 50%; Intermediate Risk  . Hyperlipidemia   . Ischemic cardiomyopathy    a.Echo (11/2013):  EF 45-50%, apical HK  . Obese   . Pancreatitis     Past Surgical History:  Procedure Laterality Date  . CHOLECYSTECTOMY    . ORCHIECTOMY      Current Medications: Outpatient Medications Prior to Visit  Medication Sig Dispense Refill  . aspirin EC 81 MG tablet Take 1 tablet (81 mg total) by mouth daily.    . carvedilol (COREG) 6.25 MG tablet TAKE 1 TABLET (6.25 MG) BY MOUTH 2 TIMES A DAY 60 tablet 5  . citalopram (CELEXA) 40 MG tablet Take 1 tablet by mouth daily. \    . clopidogrel (PLAVIX) 75 MG tablet Take 1 tablet (75 mg total) by mouth daily. 90 tablet 3  . famotidine (PEPCID) 20 MG tablet Take 20 mg by mouth daily.    . furosemide (LASIX) 20 MG tablet Take 1 tablet (20 mg total) by mouth as needed. 30 tablet   . furosemide (LASIX) 20 MG tablet TAKE 1 TABLET BY MOUTH ONCE DAILY AS NEEDED FOR WEIGHT GAIN OF 2-3 POUNDS OVERNIGHT 90 tablet 4  . lisinopril (PRINIVIL,ZESTRIL) 2.5 MG tablet Take 1 tablet (2.5 mg total) by mouth daily. 90 tablet 3  . metFORMIN (GLUCOPHAGE) 500 MG tablet Take 500 mg by mouth 2 (two) times daily.    . nitroGLYCERIN (NITROSTAT) 0.4 MG SL tablet PLACE 1  TABLET (0.4 MG TOTAL) UNDER THE TONGUE EVERY 5 (FIVE) MINUTES AS NEEDED FOR CHEST PAIN (MAY REPEAT X 3 THAN CALL 911). 25 tablet 3  . rosuvastatin (CRESTOR) 40 MG tablet TAKE 1 TABLET BY MOUTH DAILY 30 tablet 5   No facility-administered medications prior to visit.      Allergies:   Patient has no known allergies.   Social History   Social History  . Marital status: Married    Spouse name: N/A  . Number of children: 2  . Years of education: N/A   Social History Main Topics  . Smoking status: Former Games developermoker  . Smokeless tobacco: Never Used  . Alcohol use Yes     Comment: Occasional  . Drug use: No  . Sexual activity: Not Currently   Other Topics Concern  . Not on file   Social History Narrative  . No narrative on file     Family History:  The patient's ***family history includes CAD in his brother and father.   ROS:   Please see the history of present illness.    ROS All other systems reviewed and are negative.   PHYSICAL EXAM:   VS:  There were no vitals taken for this  visit.   GEN: Well nourished, well developed, in no acute distress HEENT: normal Neck: no JVD, carotid bruits, or masses Cardiac: ***RRR; no murmurs, rubs, or gallops,no edema  Respiratory:  clear to auscultation bilaterally, normal work of breathing GI: soft, nontender, nondistended, + BS MS: no deformity or atrophy Skin: warm and dry, no rash Neuro:  Alert and Oriented x 3, Strength and sensation are intact Psych: euthymic mood, full affect  Wt Readings from Last 3 Encounters:  10/09/16 261 lb 3.2 oz (118.5 kg)  07/14/15 276 lb 12.8 oz (125.6 kg)  04/15/15 278 lb (126.1 kg)      Studies/Labs Reviewed:   EKG:  EKG is*** ordered today.  The ekg ordered today demonstrates ***  Recent Labs: 10/23/2016: BUN 10; Creat 0.89; Potassium 4.7; Sodium 139   Lipid Panel    Component Value Date/Time   CHOL 140 07/15/2014 1203   TRIG 85 07/15/2014 1203   HDL 52 07/15/2014 1203   CHOLHDL 2.7  07/15/2014 1203   VLDL 17 07/15/2014 1203   LDLCALC 71 07/15/2014 1203    Additional studies/ records that were reviewed today include:  ***    ASSESSMENT:    No diagnosis found.   PLAN:  In order of problems listed above:  1. ***    Medication Adjustments/Labs and Tests Ordered: Current medicines are reviewed at length with the patient today.  Concerns regarding medicines are outlined above.  Medication changes, Labs and Tests ordered today are listed in the Patient Instructions below. There are no Patient Instructions on file for this visit.   Ramond Dial, [place]  05/25/2017 8:06 AM    [facility] [address] [place], [place], [place]  [address] Phone: [phone]; Fax: [phone]

## 2017-05-29 MED FILL — ROSUVASTATIN CALCIUM 40 MG: 40 | 30 days supply | Qty: 30 | Fill #2

## 2017-05-29 MED FILL — CARVEDILOL 6.25 MG TABLET: 6.25 | 30 days supply | Qty: 60 | Fill #2

## 2017-07-13 MED FILL — LISINOPRIL 2.5 MG TABLET: 2.5 | 90 days supply | Qty: 90 | Fill #2

## 2017-07-13 MED FILL — CITALOPRAM HBR 40 MG TABLET: 40 | 30 days supply | Qty: 30 | Fill #3

## 2017-07-30 MED FILL — ROSUVASTATIN CALCIUM 40 MG: 40 | 30 days supply | Qty: 30 | Fill #3

## 2017-07-30 MED FILL — CARVEDILOL 6.25 MG TABLET: 6.25 | 30 days supply | Qty: 60 | Fill #3

## 2017-07-30 MED FILL — metFORMIN HCL 500 MG TABS: 500 | 30 days supply | Qty: 60 | Fill #0

## 2017-09-04 MED FILL — CITALOPRAM HBR 40 MG TABLET: 40 | 30 days supply | Qty: 30 | Fill #4

## 2017-09-04 MED FILL — CLOPIDOGREL 75 MG TABLET: 75 | 90 days supply | Qty: 90 | Fill #2

## 2017-09-10 MED FILL — ROSUVASTATIN CALCIUM 40 MG: 40 | 30 days supply | Qty: 30 | Fill #4

## 2017-09-10 MED FILL — CARVEDILOL 6.25 MG TABLET: 6.25 | 30 days supply | Qty: 60 | Fill #4

## 2017-10-17 MED FILL — CITALOPRAM HBR 40 MG TABLET: 40 | 30 days supply | Qty: 30 | Fill #5

## 2017-10-24 MED FILL — busPIRone HCL 15 MG TABS: 15 | 90 days supply | Qty: 180 | Fill #0

## 2017-10-30 MED FILL — ROSUVASTATIN CALCIUM 40 MG: 40 | 30 days supply | Qty: 30 | Fill #5

## 2017-10-30 MED FILL — CARVEDILOL 6.25 MG TAB: 6.25 | 30 days supply | Qty: 60 | Fill #5

## 2017-11-27 ENCOUNTER — Other Ambulatory Visit: Payer: Self-pay | Admitting: Physician Assistant

## 2017-11-27 MED FILL — LISINOPRIL 2.5 MG TABLET: 2.5 | 30 days supply | Qty: 30 | Fill #0

## 2017-11-27 MED FILL — CITALOPRAM HBR 40 MG TABLET: 40 | 90 days supply | Qty: 90 | Fill #0

## 2017-11-27 NOTE — Telephone Encounter (Signed)
Rx(s) sent to pharmacy electronically.  

## 2017-11-28 ENCOUNTER — Telehealth: Payer: Self-pay | Admitting: Cardiology

## 2017-11-28 NOTE — Telephone Encounter (Signed)
Called patient to schedule followup and LVM to call and schedule this appointment.

## 2017-12-05 ENCOUNTER — Encounter (HOSPITAL_COMMUNITY): Payer: Self-pay | Admitting: Emergency Medicine

## 2017-12-05 ENCOUNTER — Other Ambulatory Visit: Payer: Self-pay

## 2017-12-05 ENCOUNTER — Observation Stay (HOSPITAL_COMMUNITY)
Admission: EM | Admit: 2017-12-05 | Discharge: 2017-12-07 | Disposition: A | Discharge status: Home / Self Care | Payer: BLUE CROSS/BLUE SHIELD | Attending: Family Medicine | Admitting: Family Medicine

## 2017-12-05 ENCOUNTER — Emergency Department (HOSPITAL_COMMUNITY): Payer: BLUE CROSS/BLUE SHIELD

## 2017-12-05 DIAGNOSIS — I252 Old myocardial infarction: Secondary | ICD-10-CM | POA: Insufficient documentation

## 2017-12-05 DIAGNOSIS — Z955 Presence of coronary angioplasty implant and graft: Secondary | ICD-10-CM | POA: Insufficient documentation

## 2017-12-05 DIAGNOSIS — E119 Type 2 diabetes mellitus without complications: Secondary | ICD-10-CM | POA: Diagnosis not present

## 2017-12-05 DIAGNOSIS — I2510 Atherosclerotic heart disease of native coronary artery without angina pectoris: Secondary | ICD-10-CM | POA: Diagnosis not present

## 2017-12-05 DIAGNOSIS — Z79899 Other long term (current) drug therapy: Secondary | ICD-10-CM | POA: Diagnosis not present

## 2017-12-05 DIAGNOSIS — E1169 Type 2 diabetes mellitus with other specified complication: Secondary | ICD-10-CM | POA: Diagnosis present

## 2017-12-05 DIAGNOSIS — Z7982 Long term (current) use of aspirin: Secondary | ICD-10-CM | POA: Diagnosis not present

## 2017-12-05 DIAGNOSIS — I5042 Chronic combined systolic (congestive) and diastolic (congestive) heart failure: Secondary | ICD-10-CM | POA: Diagnosis not present

## 2017-12-05 DIAGNOSIS — I208 Other forms of angina pectoris: Secondary | ICD-10-CM

## 2017-12-05 DIAGNOSIS — I110 Hypertensive heart disease with heart failure: Secondary | ICD-10-CM | POA: Diagnosis not present

## 2017-12-05 DIAGNOSIS — R079 Chest pain, unspecified: Principal | ICD-10-CM | POA: Insufficient documentation

## 2017-12-05 DIAGNOSIS — I200 Unstable angina: Secondary | ICD-10-CM | POA: Diagnosis present

## 2017-12-05 DIAGNOSIS — Z6832 Body mass index (BMI) 32.0-32.9, adult: Secondary | ICD-10-CM | POA: Insufficient documentation

## 2017-12-05 DIAGNOSIS — E785 Hyperlipidemia, unspecified: Secondary | ICD-10-CM | POA: Diagnosis present

## 2017-12-05 DIAGNOSIS — I5022 Chronic systolic (congestive) heart failure: Secondary | ICD-10-CM | POA: Diagnosis present

## 2017-12-05 DIAGNOSIS — I10 Essential (primary) hypertension: Secondary | ICD-10-CM | POA: Diagnosis present

## 2017-12-05 DIAGNOSIS — Z87891 Personal history of nicotine dependence: Secondary | ICD-10-CM | POA: Diagnosis not present

## 2017-12-05 DIAGNOSIS — I255 Ischemic cardiomyopathy: Secondary | ICD-10-CM | POA: Diagnosis not present

## 2017-12-05 DIAGNOSIS — E669 Obesity, unspecified: Secondary | ICD-10-CM | POA: Diagnosis not present

## 2017-12-05 HISTORY — DX: Depression, unspecified: F32.A

## 2017-12-05 HISTORY — DX: Acute myocardial infarction, unspecified: I21.9

## 2017-12-05 HISTORY — DX: Type 2 diabetes mellitus without complications: E11.9

## 2017-12-05 HISTORY — DX: Major depressive disorder, single episode, unspecified: F32.9

## 2017-12-05 HISTORY — DX: ST elevation (STEMI) myocardial infarction involving other coronary artery of anterior wall: I21.09

## 2017-12-05 HISTORY — DX: Essential (primary) hypertension: I10

## 2017-12-05 LAB — HEMOGLOBIN A1C
Hgb A1c MFr Bld: 6.4 % — ABNORMAL HIGH (ref 4.8–5.6)
Mean Plasma Glucose: 136.98 mg/dL

## 2017-12-05 LAB — BASIC METABOLIC PANEL
Anion gap: 8 (ref 5–15)
BUN: 7 mg/dL (ref 6–20)
CALCIUM: 9.3 mg/dL (ref 8.9–10.3)
CO2: 26 mmol/L (ref 22–32)
Chloride: 106 mmol/L (ref 101–111)
Creatinine, Ser: 0.82 mg/dL (ref 0.61–1.24)
GFR calc Af Amer: >60 mL/min (ref >60)
GFR calc non Af Amer: >60 mL/min (ref >60)
GLUCOSE: 120 mg/dL — AB (ref 65–99)
Potassium: 4 mmol/L (ref 3.5–5.1)
Sodium: 140 mmol/L (ref 135–145)

## 2017-12-05 LAB — GLUCOSE, CAPILLARY
Glucose-Capillary: 114 mg/dL — ABNORMAL HIGH (ref 65–99)
Glucose-Capillary: 219 mg/dL — ABNORMAL HIGH (ref 65–99)

## 2017-12-05 LAB — CBC
HEMATOCRIT: 43.1 % (ref 39.0–52.0)
HEMOGLOBIN: 14.8 g/dL (ref 13.0–17.0)
MCH: 31.5 pg (ref 26.0–34.0)
MCHC: 34.3 g/dL (ref 30.0–36.0)
MCV: 91.7 fL (ref 78.0–100.0)
Platelets: 177 10*3/uL (ref 150–400)
RBC: 4.7 MIL/uL (ref 4.22–5.81)
RDW: 13.2 % (ref 11.5–15.5)
WBC: 8 10*3/uL (ref 4.0–10.5)

## 2017-12-05 LAB — TROPONIN I: Troponin I: <0.03 ng/mL (ref <0.03)

## 2017-12-05 LAB — I-STAT TROPONIN, ED: TROPONIN I, POC: 0 ng/mL (ref 0.00–0.08)

## 2017-12-05 LAB — SURGICAL PCR SCREEN
MRSA, PCR: NEGATIVE
Staphylococcus aureus: NEGATIVE

## 2017-12-05 LAB — TSH: TSH: 1.81 u[IU]/mL (ref 0.350–4.500)

## 2017-12-05 MED ORDER — CARVEDILOL 6.25 MG PO TABS
6.2500 mg | ORAL_TABLET | Freq: Two times a day (BID) | ORAL | Status: DC
  Administered 2017-12-05 – 2017-12-07 (×3): 6.25 mg via ORAL
  Filled 2017-12-05 (×3): qty 1

## 2017-12-05 MED ORDER — ONDANSETRON HCL 4 MG/2ML IJ SOLN: 4.0000 mg | Freq: Four times a day (QID) | INTRAMUSCULAR | Status: DC | PRN

## 2017-12-05 MED ORDER — SODIUM CHLORIDE 0.9% FLUSH: 3.0000 mL | INTRAVENOUS | Status: DC | PRN

## 2017-12-05 MED ORDER — ROSUVASTATIN CALCIUM 40 MG PO TABS
40.0000 mg | ORAL_TABLET | Freq: Every day | ORAL | Status: DC
  Administered 2017-12-05 – 2017-12-07 (×3): 40 mg via ORAL
  Filled 2017-12-05 (×3): qty 1

## 2017-12-05 MED ORDER — ACETAMINOPHEN 325 MG PO TABS: 650.0000 mg | ORAL_TABLET | ORAL | Status: DC | PRN

## 2017-12-05 MED ORDER — SODIUM CHLORIDE 0.9 % IV SOLN: 250.0000 mL | INTRAVENOUS | Status: DC | PRN

## 2017-12-05 MED ORDER — MORPHINE SULFATE (PF) 4 MG/ML IV SOLN: 2.0000 mg | INTRAVENOUS | Status: DC | PRN

## 2017-12-05 MED ORDER — MUPIROCIN 2 % EX OINT
1.0000 "application " | TOPICAL_OINTMENT | Freq: Two times a day (BID) | CUTANEOUS | Status: DC
  Filled 2017-12-05: qty 22

## 2017-12-05 MED ORDER — FAMOTIDINE 20 MG PO TABS
20.0000 mg | ORAL_TABLET | Freq: Every day | ORAL | Status: DC
  Administered 2017-12-05 – 2017-12-06 (×2): 20 mg via ORAL
  Filled 2017-12-05 (×2): qty 1

## 2017-12-05 MED ORDER — INSULIN ASPART 100 UNIT/ML ~~LOC~~ SOLN
0.0000 [IU] | SUBCUTANEOUS | Status: DC
  Administered 2017-12-05: 5 [IU] via SUBCUTANEOUS

## 2017-12-05 MED ORDER — BUSPIRONE HCL 5 MG PO TABS
7.5000 mg | ORAL_TABLET | Freq: Two times a day (BID) | ORAL | Status: DC
  Administered 2017-12-05 – 2017-12-07 (×4): 7.5 mg via ORAL
  Filled 2017-12-05: qty 2
  Filled 2017-12-05: qty 1
  Filled 2017-12-05: qty 2
  Filled 2017-12-05: qty 1
  Filled 2017-12-05 (×2): qty 2

## 2017-12-05 MED ORDER — CITALOPRAM HYDROBROMIDE 20 MG PO TABS
40.0000 mg | ORAL_TABLET | Freq: Every day | ORAL | Status: DC
  Administered 2017-12-06 – 2017-12-07 (×2): 40 mg via ORAL
  Filled 2017-12-05 (×2): qty 2

## 2017-12-05 MED ORDER — NITROGLYCERIN 0.4 MG SL SUBL: 0.4000 mg | SUBLINGUAL_TABLET | SUBLINGUAL | Status: DC | PRN

## 2017-12-05 MED ORDER — ASPIRIN 81 MG PO CHEW
81.0000 mg | CHEWABLE_TABLET | ORAL | Status: AC
  Administered 2017-12-06: 81 mg via ORAL
  Filled 2017-12-05: qty 1

## 2017-12-05 MED ORDER — SODIUM CHLORIDE 0.9 % IV SOLN: INTRAVENOUS | Status: DC

## 2017-12-05 MED ORDER — LISINOPRIL 5 MG PO TABS
2.5000 mg | ORAL_TABLET | Freq: Every day | ORAL | Status: DC
  Administered 2017-12-06 – 2017-12-07 (×2): 2.5 mg via ORAL
  Filled 2017-12-05 (×2): qty 1

## 2017-12-05 MED ORDER — HEPARIN BOLUS VIA INFUSION
4000.0000 [IU] | Freq: Once | INTRAVENOUS | Status: DC
  Filled 2017-12-05: qty 4000

## 2017-12-05 MED ORDER — HEPARIN (PORCINE) IN NACL 100-0.45 UNIT/ML-% IJ SOLN
1400.0000 [IU]/h | INTRAMUSCULAR | Status: DC
  Filled 2017-12-05: qty 250

## 2017-12-05 MED ORDER — SODIUM CHLORIDE 0.9% FLUSH
3.0000 mL | Freq: Two times a day (BID) | INTRAVENOUS | Status: DC
  Administered 2017-12-05 – 2017-12-06 (×2): 3 mL via INTRAVENOUS

## 2017-12-05 MED ORDER — GI COCKTAIL ~~LOC~~: 30.0000 mL | Freq: Four times a day (QID) | ORAL | Status: DC | PRN

## 2017-12-05 MED ORDER — ASPIRIN EC 325 MG PO TBEC
325.0000 mg | DELAYED_RELEASE_TABLET | Freq: Every day | ORAL | Status: DC
  Filled 2017-12-05: qty 1

## 2017-12-05 NOTE — ED Provider Notes (Addendum)
[facility] EMERGENCY DEPARTMENT Provider Note   CSN: [id number] Arrival date & time: 12/05/17  1420     History   Chief Complaint Chief Complaint  Patient presents with  . Chest Pain    HPI Martin Frank is a 62 y.o. male.  62 year old male with prior history of CAD, hypertension, hyperlipidemia, DM, and MI presents with complaint with chest pain.  Patient reports that he was at work - at about Noon he began to have substernal chest pressure.  Patient reports that this pressure was unusual for him.  The last episode of chest pain that the patient experienced was about 4 years ago when he had an MI.  Today's pain is not resolved.  He took a nitro at about 1215 this afternoon which improved his pain and then was given a second dose of nitro in route from EMS.  He has already taken aspirin.  He denies current chest pain, shortness of breath, nausea, vomiting, diaphoresis, or other other complaint acute complaint.  He reports that his last cath was about 4 years ago when he had an MI.  He denies any recent stress test or other cardiac testing.  The history is provided by the patient and medical records.  Chest Pain   This is a new problem. The current episode started 3 to 5 hours ago. The problem occurs rarely. The problem has been resolved. Associated with: unprovoked. The pain is present in the substernal region. The patient is experiencing no pain. The quality of the pain is described as heavy and pressure-like. The pain does not radiate. Pertinent negatives include no back pain, no fever, no nausea, no near-syncope, no numbness, no shortness of breath, no syncope, no vomiting and no weakness. He has tried nitroglycerin for the symptoms. The treatment provided significant relief.    Past Medical History:  Diagnosis Date  . CAD (coronary artery disease)    remote anterior MI with VT and DES to the LAD in May of 2010; NSTEMI July 2014 in [place] with cath showing stent  patency, EF of 35 to 40% and possible vasospasm versus potential small vessel disease.   . Chronic systolic heart failure (HCC)   . History of cardiac cath    2  . Hx of cardiovascular stress test 2015   Lexiscan Myoview (11/2013):  Anteroseptal, apical anterior, apex, apical inferior scar; no ischemia, EF 50%; Intermediate Risk  . Hyperlipidemia   . Ischemic cardiomyopathy    a.Echo (11/2013):  EF 45-50%, apical HK  . Obese   . Pancreatitis     Patient Active Problem List   Diagnosis Date Noted  . Diabetes mellitus type 2 in obese (HCC) 04/15/2015  . Essential hypertension 04/15/2015  . Chronic systolic heart failure (HCC) 12/29/2013  . Cardiomyopathy, ischemic 05/07/2013  . CAD (coronary artery disease)   . Hyperlipidemia   . Obese   . Pancreatitis   . Chest pain     Past Surgical History:  Procedure Laterality Date  . CHOLECYSTECTOMY    . ORCHIECTOMY          Home Medications    Prior to Admission medications   Medication Sig Start Date End Date Taking? Authorizing Provider  aspirin EC 81 MG tablet Take 1 tablet (81 mg total) by mouth daily. 10/09/16   Azalee Course, PA  carvedilol (COREG) 6.25 MG tablet TAKE 1 TABLET (6.25 MG) BY MOUTH 2 TIMES A DAY 02/02/17   Lewayne Bunting, MD  citalopram (CELEXA)  40 MG tablet Take 1 tablet by mouth daily. \ 12/10/12   [provider]  clopidogrel (PLAVIX) 75 MG tablet Take 1 tablet (75 mg total) by mouth daily. 11/14/16   Lewayne Bunting, MD  famotidine (PEPCID) 20 MG tablet Take 20 mg by mouth daily.    [provider]  furosemide (LASIX) 20 MG tablet Take 1 tablet (20 mg total) by mouth as needed. 10/09/16   Azalee Course, PA  furosemide (LASIX) 20 MG tablet TAKE 1 TABLET BY MOUTH ONCE DAILY AS NEEDED FOR WEIGHT GAIN OF 2-3 POUNDS OVERNIGHT 04/30/17   Jodelle Gross, NP  lisinopril (PRINIVIL,ZESTRIL) 2.5 MG tablet Take 1 tablet (2.5 mg total) by mouth daily. PLEASE CONTACT OFFICE FOR ADDITIONAL REFILLS 11/27/17 12/27/17   Lewayne Bunting, MD  metFORMIN (GLUCOPHAGE) 500 MG tablet Take 500 mg by mouth 2 (two) times daily. 04/06/15 07/14/15  [provider]  nitroGLYCERIN (NITROSTAT) 0.4 MG SL tablet PLACE 1 TABLET (0.4 MG TOTAL) UNDER THE TONGUE EVERY 5 (FIVE) MINUTES AS NEEDED FOR CHEST PAIN (MAY REPEAT X 3 THAN CALL 911). 02/02/17   Lewayne Bunting, MD  rosuvastatin (CRESTOR) 40 MG tablet TAKE 1 TABLET BY MOUTH DAILY 02/20/17   Lewayne Bunting, MD    Family History Family History  Problem Relation Age of Onset  . CAD Father        MI at age 75  . CAD Brother        MI at age 26    Social History Social History   Tobacco Use  . Smoking status: Former Games developer  . Smokeless tobacco: Never Used  Substance Use Topics  . Alcohol use: Yes    Comment: Occasional  . Drug use: No     Allergies   Patient has no known allergies.   Review of Systems Review of Systems  Constitutional: Negative for fever.  Respiratory: Negative for shortness of breath.   Cardiovascular: Positive for chest pain. Negative for syncope and near-syncope.  Gastrointestinal: Negative for nausea and vomiting.  Musculoskeletal: Negative for back pain.  Neurological: Negative for weakness and numbness.  All other systems reviewed and are negative.    Physical Exam Updated Vital Signs BP 127/77   Pulse (!) 55   Resp 13   Ht 6\' 2"  (1.88 m)   Wt 113.4 kg (250 lb)   SpO2 99%   BMI 32.10 kg/m   Physical Exam  Constitutional: He is oriented to person, place, and time. He appears well-developed and well-nourished. No distress.  HENT:  Head: Normocephalic and atraumatic.  Mouth/Throat: Oropharynx is clear and moist.  Eyes: Pupils are equal, round, and reactive to light. Conjunctivae and EOM are normal.  Neck: Normal range of motion. Neck supple.  Cardiovascular: Normal rate, regular rhythm and normal heart sounds.  Pulmonary/Chest: Effort normal and breath sounds normal. No respiratory distress.  Abdominal:  Soft. He exhibits no distension. There is no tenderness.  Musculoskeletal: Normal range of motion. He exhibits no edema or deformity.  Neurological: He is alert and oriented to person, place, and time.  Skin: Skin is warm and dry.  Psychiatric: He has a normal mood and affect.  Nursing note and vitals reviewed.    ED Treatments / Results  Labs (all labs ordered are listed, but only abnormal results are displayed) Labs Reviewed  BASIC METABOLIC PANEL - Abnormal; Notable for the following components:      Result Value   Glucose, Bld 120 (*)  All other components within normal limits  CBC  I-STAT TROPONIN, ED    EKG EKG Interpretation  Date/Time:  Wednesday Dec 05 2017 14:25:56 EDT Ventricular Rate:  54 PR Interval:    QRS Duration: 107 QT Interval:  450 QTC Calculation: 427 R Axis:   -47 Text Interpretation:  Sinus rhythm Inferior infarct, old Anterior infarct, old Confirmed by Kristine Royal [phone]) on 12/05/2017 2:54:44 PM   Radiology Dg Chest 2 View  Result Date: 12/05/2017 CLINICAL DATA:  Intermittent chest pain centrally and radiating to back beginning today. Four previous heart attacks. EXAM: CHEST - 2 VIEW COMPARISON:  03/30/2015 FINDINGS: Lungs are adequately inflated and otherwise clear. Mild stable cardiomegaly. Remainder the exam is unchanged. IMPRESSION: No acute cardiopulmonary disease. Electronically Signed   By: Elberta Fortis M.D.   On: 12/05/2017 15:54    Procedures Procedures (including critical care time)  Medications Ordered in ED Medications - No data to display   Initial Impression / Assessment and Plan / ED Course  I have reviewed the triage vital signs and the nursing notes.  Pertinent labs & imaging results that were available during my care of the patient were reviewed by me and considered in my medical decision making (see chart for details).     MDM  Screen complete  Patient is presenting with an episode of chest pain.  Patient with  prior known history of CAD status post stenting. Initial EKG does not suggest acute STEMI.  Initial troponin is negative.  Patient's chest pain is completely resolved at time of my evaluation.  Patient will be admitted for further work-up and rule out of ACS. Hospitalist service Verline Lema) is aware of case and will evaluate for admission.   Final Clinical Impressions(s) / ED Diagnoses   Final diagnoses:  Chest pain, unspecified type    ED Discharge Orders    None       Wynetta Fines, MD 12/05/17 1600    Wynetta Fines, MD 12/05/17 [phone]

## 2017-12-05 NOTE — Consult Note (Signed)
Cardiology Consultation:   Patient ID: Martin Frank; [id number]; [birth year]   Admit date: 12/05/2017 Date of Consult: 12/05/2017  Primary Care Provider: [name], MD Primary Cardiologist: Dr. [name], remotely  Patient Profile:   Martin Frank is a 62 y.o. male with a hx of pancreatitis, HLD, ICM and CAD.  He is on multiple MIs dating back to 2010 in which he received a drug-eluting stent to his LAD.  He is being seen today for the evaluation of chest pain at the request of Cheron Schaumann, [place] with Internal Medicine.   History of Present Illness:   Martin Frank is a 62 year old male with a past medical history as stated above.  Based on previous records, he had an anterior MI complicated by ventricular tachycardia in May 2010.  He had a drug-eluting stent placed in the LAD.  He apparently had nausea and vomiting and did not keep his Plavix down, he suffered stent thrombosis requiring a second intervention.  He also had a non-STEMI in July 2014 in [place].  Peak troponin was 2.62.  Cath revealed patent LAD stent with mild to moderate nonobstructive disease otherwise.  LVEF 35 to 40%.  Echocardiogram obtained in May 2015 showed improved ejection fraction of 45 to 50%.  He apparently suffered another MI in August 2016.  The cath at that time at [facility] showed moderate PDA disease, small ramus with 70 to 80% ostial lesion, patent LAD stent.  There was normal LV function.  His last office visit was 07/14/2015.  Unfortunately he was lost to follow-up since that  time.  He was last seen in follow-up approximately 1 year ago on 10/09/2016.  At that time he denied recurrence of his chest discomfort.  He reported that he stopped taking his lisinopril 5 mg after he ran out of medication in October 2017.  His pressure was well controlled at 134/87.  EKG was obtained which continue to show poor progression of R waves in the anterior leads consistent with prior history of anterior MI.   Otherwise, he was doing quite well.  He was only taking Lasix as needed and reported only occasional ankle edema.  He was restarted on lisinopril 2.5 mg with recommendations to follow with Dr. [name] in approximately 6 months.  Patient reported that his primary care was checking his lipid panel and liver function tests.  Patient presented to [facility], ED on 12/05/2017 with acute substernal chest pain with radiation to his mid back while driving to work. He took two baaby ASA and one SL NTG with complete relief. Patient reports he felt clammy during this time, and this is similar to his previous pain and episodes in the past. Patient denies shortness of breath, orthopnea, lower extremity edema, palpitations or numbness and tingling in his hands. Given his symptoms, he reported to his PCP where he experienced another episode. EMS was called and he was given one additional SL NTG with relief. He was then transferred to the ED for further evaluation.  He is scheduled to see Dr. [name] in August 2019  Past Medical History:  Diagnosis Date  . 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   . Chronic systolic heart failure (HCC)   . History of cardiac cath  2  . Hx of cardiovascular stress test 2015   Lexiscan Myoview (11/2013):  Anteroseptal, apical anterior, apex, apical inferior scar; no ischemia, EF 50%; Intermediate Risk  . Hyperlipidemia   . Ischemic cardiomyopathy    a.Echo (11/2013):  EF 45-50%, apical HK  . Obese   . Pancreatitis     Past Surgical History:  Procedure Laterality Date  . CHOLECYSTECTOMY    . ORCHIECTOMY       Prior to Admission medications   Medication Sig Start Date End Date Taking? Authorizing Provider  busPIRone (BUSPAR) 15 MG tablet Take 7.5 mg by mouth 2 (two) times daily.  10/24/17  Yes [provider]  carvedilol (COREG) 6.25 MG tablet TAKE 1 TABLET (6.25 MG) BY MOUTH 2 TIMES A DAY 02/02/17  Yes Crenshaw, Madolyn Frieze, MD  citalopram (CELEXA) 40 MG tablet Take 40 mg by mouth daily.  12/10/12  Yes [provider]  famotidine (PEPCID) 20 MG tablet Take 20 mg by mouth at bedtime.    Yes [provider]  lisinopril (PRINIVIL,ZESTRIL) 2.5 MG tablet Take 1 tablet (2.5 mg total) by mouth daily. PLEASE CONTACT OFFICE FOR ADDITIONAL REFILLS Patient taking differently: Take 2.5 mg by mouth daily.  11/27/17 12/27/17 Yes Crenshaw, Madolyn Frieze, MD  nitroGLYCERIN (NITROSTAT) 0.4 MG SL tablet PLACE 1 TABLET (0.4 MG TOTAL) UNDER THE TONGUE EVERY 5 (FIVE) MINUTES AS NEEDED FOR CHEST PAIN (MAY REPEAT X 3 THAN CALL 911). 02/02/17  Yes Lewayne Bunting, MD  rosuvastatin (CRESTOR) 40 MG tablet TAKE 1 TABLET BY MOUTH DAILY 02/20/17  Yes Lewayne Bunting, MD  UNABLE TO FIND CBD oil: Use 1 dropperful sublingually once a day   Yes [provider]  UNABLE TO FIND SuperBe[employer] liquid: Drink 6 ounces by mouth once a day   Yes [provider]    Inpatient Medications: Scheduled Meds: . aspirin EC  325 mg Oral Daily  . busPIRone  7.5 mg Oral BID  . carvedilol  6.25 mg Oral BID WC  . [START ON 12/06/2017] citalopram  40 mg Oral Daily  . famotidine  20 mg Oral QHS  . insulin aspart  0-15 Units Subcutaneous Q4H  . [START ON 12/06/2017] lisinopril  2.5 mg Oral Daily  . rosuvastatin  40 mg Oral Daily   Continuous Infusions:  PRN Meds: acetaminophen, gi cocktail, morphine injection, nitroGLYCERIN, ondansetron (ZOFRAN) IV  Allergies:   No Known Allergies  Social History:   Social History   Socioeconomic History  . Marital status: Married    Spouse name: Not on file  . Number of children: 2  . Years of education: Not on file  . Highest education level: Not on file  Occupational History  . Not on file  Social Needs  . Financial resource strain: Not on file    . Food insecurity:    Worry: Not on file    Inability: Not on file  . Transportation needs:    Medical: Not on file    Non-medical: Not on file  Tobacco Use  . Smoking status: Former Games developer  . Smokeless tobacco: Never Used  Substance and Sexual Activity  . Alcohol use: Yes    Comment: Occasional  . Drug use: No  . Sexual activity: Not Currently  Lifestyle  . Physical activity:    Days per week: Not on file    Minutes per session: Not on file  . Stress: Not on file  Relationships  . Social connections:    Talks on  phone: Not on file    Gets together: Not on file    Attends religious service: Not on file    Active member of club or organization: Not on file    Attends meetings of clubs or organizations: Not on file    Relationship status: Not on file  . Intimate partner violence:    Fear of current or ex partner: Not on file    Emotionally abused: Not on file    Physically abused: Not on file    Forced sexual activity: Not on file  Other Topics Concern  . Not on file  Social History Narrative  . Not on file    Family History:   Family History  Problem Relation Age of Onset  . CAD Father        MI at age 74  . CAD Brother        MI at age 56   Family Status:  Family Status  Relation Name Status  . Father  Deceased  . Mother  Alive  . Brother  (Not Specified)  . MGM  Deceased  . MGF  Deceased  . PGM  Deceased  . PGF  Deceased    ROS:  Please see the history of present illness.  All other ROS reviewed and negative.     Physical Exam/Data:   Vitals:   12/05/17 1500 12/05/17 1530 12/05/17 1600 12/05/17 1630  BP: 135/84 137/80 132/66 129/74  Pulse: (!) 56 (!) 54 (!) 52 (!) 52  Resp: (!) 22 (!) 22 16 (!) 21  SpO2: 96% 98% 95% 94%  Weight:      Height:       No intake or output data in the 24 hours ending 12/05/17 1816 Filed Weights   12/05/17 1424  Weight: 250 lb (113.4 kg)   Body mass index is 32.1 kg/m.   General: Well developed, well  nourished, NAD Skin: Warm, dry, intact  Head: Normocephalic, atraumatic,  clear, moist mucus membranes. Neck: Negative for carotid bruits. No JVD Lungs:Clear to ausculation bilaterally. No wheezes, rales, or rhonchi. Breathing is unlabored. Cardiovascular: RRR with S1 S2. No murmurs, rubs, gallops, or LV heave appreciated. Abdomen: Soft, non-tender, non-distended with normoactive bowel sounds.  No obvious abdominal masses. MSK: Strength and tone appear normal for age. 5/5 in all extremities Extremities: No edema. No clubbing or cyanosis. DP/PT pulses 2+ bilaterally Neuro: Alert and oriented. No focal deficits. No facial asymmetry. MAE spontaneously. Psych: Responds to questions appropriately with normal affect.     EKG:  The EKG was personally reviewed and demonstrates: 12/05/2017 NSR/SB HR 54 Telemetry:  Telemetry was personally reviewed and demonstrates: 12/05/2017 NSR/SB  Relevant CV Studies:  ECHO: 12/24/2013 EF 45-50% - this is improved from 01/2013.  CATH: 01/2013 1. Right coronary artery: Right coronary artery has a 30% to 40% PDA lesion. The lesion is in the mid PDA. The PDA is normal in caliber. There is small PL1 small PL2. 2. Left main: The left main is short. 3. Ramus intermedius: This is a normal size ramus. 4. Left circumflex: The left circumflex is primarily dominated by a very large OM1 which has a 20% lesion. The remainder of the circumflex is very small. 5. Left anterior descending: The ostium of the left anterior descending has about a 30% to 40% lesion. There is a normal diagonal 1 with a 50% ostial lesion. There is a stent that has 10% in-stent restenosis after the diagonal within the left anterior descending itself. There  is a small second diagonal with a 40% lesion. The distal left anterior descending has some diffuse 10% to 20% irregularities.  Myocardial perfusion imaging: 12/26/2013 Stress test results are c/w prior heart attack No ischemia EF 50% - c/w  recent echo Continue with current treatment plan.  Laboratory Data:  Chemistry Recent Labs  Lab 12/05/17 1426  NA 140  K 4.0  CL 106  CO2 26  GLUCOSE 120*  BUN 7  CREATININE 0.82  CALCIUM 9.3  GFRNONAA >60  GFRAA >60  ANIONGAP 8    Total Protein  Date Value Ref Range Status  07/15/2014 6.7 6.0 - 8.3 g/dL Final   Albumin  Date Value Ref Range Status  07/15/2014 4.1 3.5 - 5.2 g/dL Final   AST  Date Value Ref Range Status  07/15/2014 42 (H) 0 - 37 U/L Final   ALT  Date Value Ref Range Status  07/15/2014 44 0 - 53 U/L Final   Alkaline Phosphatase  Date Value Ref Range Status  07/15/2014 75 39 - 117 U/L Final   Total Bilirubin  Date Value Ref Range Status  07/15/2014 0.7 0.2 - 1.2 mg/dL Final   Hematology Recent Labs  Lab 12/05/17 1426  WBC 8.0  RBC 4.70  HGB 14.8  HCT 43.1  MCV 91.7  MCH 31.5  MCHC 34.3  RDW 13.2  PLT 177   Cardiac EnzymesNo results for input(s): TROPONINI in the last 168 hours.  Recent Labs  Lab 12/05/17 1445  TROPIPOC 0.00    BNPNo results for input(s): BNP, PROBNP in the last 168 hours.  DDimer No results for input(s): DDIMER in the last 168 hours. TSH:  Lab Results  Component Value Date   TSH 1.21 12/08/2013   Lipids: Lab Results  Component Value Date   CHOL 140 07/15/2014   HDL 52 07/15/2014   LDLCALC 71 07/15/2014   TRIG 85 07/15/2014   CHOLHDL 2.7 07/15/2014   HgbA1c:No results found for: HGBA1C  Radiology/Studies:  Dg Chest 2 View  Result Date: 12/05/2017 CLINICAL DATA:  Intermittent chest pain centrally and radiating to back beginning today. Four previous heart attacks. EXAM: CHEST - 2 VIEW COMPARISON:  03/30/2015 FINDINGS: Lungs are adequately inflated and otherwise clear. Mild stable cardiomegaly. Remainder the exam is unchanged. IMPRESSION: No acute cardiopulmonary disease. Electronically Signed   By: Elberta Fortis M.D.   On: 12/05/2017 15:54   Assessment and Plan:   1.  Chest pain with hx of CAD s/p  anterior MI in May 2010 with DES stent to LAD, non-STEMI 2014: -Patient with long history of CAD including DES to LAD 2010 and non-STEMI 2014 with essentially unchanged cath, including patent stent and nonobstructive disease.  LVEF at that time was down to 35 to 40% with a repeat echocardiogram in 2015 in which his LVEF was improved to 45-50%. -Presents with acute typical ACS anterior chest pain with associated shortness of breath and diaphoresis. -Troponin, iStat 0.00. Continue to trend.  -EKG shows normal sinus rhythm with no acute ST-T wave abnormalities -Currently chest pain-free -ASA, carvedilol 6.25, lisinopril 2.5, statin -Plan for further ischemic workup with cardiac catheterization tomorrow or Friday versus Myoview stress  -Will discuss plan further with MD   2.  HTN: -Stable, 129/74, 132/66, 137/80 -Continue carvedilol 6.25, lisinopril 2.5  3.  DM2: -Patient treated on home metformin>>> this is currently on hold -SSI per primary team  4.  Ischemic cardiomyopathy: -Initial cardiac work-up in 2010 revealed LVEF of 35 to 40% with improvement  by 2015 with LVEF of 45 to 50%. -Will order repeat echocardiogram for further evaluation -Continue carvedilol 6.25, lisinopril 2.5   5.  Bradycardia: -Patient is asymptomatic with heart rates in the low to mid 50s -Will continue to monitor   For questions or updates, please contact [facility] Please consult [email or web address] for contact info under Cardiology/STEMI.   SignedGeorgie Chard NP-C [facility] Pager: [phone] 12/05/2017 6:16 PM

## 2017-12-05 NOTE — ED Triage Notes (Addendum)
Pt reports sudden oneset centralized chest pain radiating into the back while at rest around 1200. Pt reports hx of heart cath X 2. Pt has had Nitro X 2 and 324 Asprin. 18G to LAC. Pt was noted to be 89% on room air. Not normally on O2. Pt on 2 L O2. No pain at present. Currently at a rate in the 50s. Given by EMS.  Denies shortness of breath, abdominal pain or fevers. Denies cough or painful urination. Denies N/V/D. Pt does endorse depression and states "no suicidal thoughts at this time."

## 2017-12-05 NOTE — H&P (Signed)
History and Physical    Martin Frank HXT:[id number] DOB: [birth year] DOA: 12/05/2017  PCP: [name], MD Patient coming from: home   Chief Complaint: Chest pain   HPI: Martin Frank is a 62 y.o. male with medical history significant of  CAD, hypertension, hyperlipidemia, DM and MI.  Pt reports he began having pain in his chest today around 12 noon.  Pt reports the last time he had chest pain was when he had a heart attack 4 years ago.  Pt reports pain was in the substernal area. Pt reports the pain started in his chest and went straight through to his back. Pt reports he felt clammy.  Pt reports he was sweating.  He states this was the same pain he has had in the past except that previous pain went down his arm.  Pt reports no arm pain.  He denies any shortness of breath  Pt took sublingual nitroglycerin with some relief.  Pt reports complete relief after 2nd nitroglycerin, Pt reports he is scheduled to see Dr. [name] in August.      ED Course: Pt seen in ED by Dr. Rodena Medin. Pt has been pain free since he has been in the Emergency department. Pt has had an unchanged EKG and a negative troponin   Review of Systems: Review of Systems  Constitutional: Positive for diaphoresis.  Cardiovascular: Positive for chest pain.  Gastrointestinal: Negative for heartburn and nausea.  Neurological: Positive for headaches. Negative for dizziness.  All other systems reviewed and are negative.   Ambulatory Status:Ambulatory   Past Medical History:  Diagnosis Date  . 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   . Chronic systolic heart failure (HCC)   . History of cardiac cath    2  . Hx of cardiovascular stress test 2015   Lexiscan Myoview (11/2013):  Anteroseptal, apical anterior, apex, apical inferior  scar; no ischemia, EF 50%; Intermediate Risk  . Hyperlipidemia   . Ischemic cardiomyopathy    a.Echo (11/2013):  EF 45-50%, apical HK  . Obese   . Pancreatitis     Past Surgical History:  Procedure Laterality Date  . CHOLECYSTECTOMY    . ORCHIECTOMY      Social History   Socioeconomic History  . Marital status: Married    Spouse name: Not on file  . Number of children: 2  . Years of education: Not on file  . Highest education level: Not on file  Occupational History  . Not on file  Social Needs  . Financial resource strain: Not on file  . Food insecurity:    Worry: Not on file    Inability: Not on file  . Transportation needs:    Medical: Not on file    Non-medical: Not on file  Tobacco Use  . Smoking status: Former Games developer  . Smokeless tobacco: Never Used  Substance and Sexual Activity  . Alcohol use: Yes    Comment: Occasional  . Drug use: No  . Sexual activity: Not Currently  Lifestyle  . Physical activity:    Days per week: Not on file    Minutes per session: Not on file  . Stress: Not on file  Relationships  . Social connections:    Talks on phone: Not on file  Gets together: Not on file    Attends religious service: Not on file    Active member of club or organization: Not on file    Attends meetings of clubs or organizations: Not on file    Relationship status: Not on file  . Intimate partner violence:    Fear of current or ex partner: Not on file    Emotionally abused: Not on file    Physically abused: Not on file    Forced sexual activity: Not on file  Other Topics Concern  . Not on file  Social History Narrative  . Not on file    No Known Allergies  Family History  Problem Relation Age of Onset  . CAD Father        MI at age 32  . CAD Brother        MI at age 10    Prior to Admission medications   Medication Sig Start Date End Date Taking? Authorizing Provider  aspirin EC 81 MG tablet Take 1 tablet (81 mg total) by mouth daily.  10/09/16   Azalee Course, PA  carvedilol (COREG) 6.25 MG tablet TAKE 1 TABLET (6.25 MG) BY MOUTH 2 TIMES A DAY 02/02/17   Lewayne Bunting, MD  citalopram (CELEXA) 40 MG tablet Take 1 tablet by mouth daily. \ 12/10/12   [provider]  clopidogrel (PLAVIX) 75 MG tablet Take 1 tablet (75 mg total) by mouth daily. 11/14/16   Lewayne Bunting, MD  famotidine (PEPCID) 20 MG tablet Take 20 mg by mouth daily.    [provider]  furosemide (LASIX) 20 MG tablet Take 1 tablet (20 mg total) by mouth as needed. 10/09/16   Azalee Course, PA  furosemide (LASIX) 20 MG tablet TAKE 1 TABLET BY MOUTH ONCE DAILY AS NEEDED FOR WEIGHT GAIN OF 2-3 POUNDS OVERNIGHT 04/30/17   Jodelle Gross, NP  lisinopril (PRINIVIL,ZESTRIL) 2.5 MG tablet Take 1 tablet (2.5 mg total) by mouth daily. PLEASE CONTACT OFFICE FOR ADDITIONAL REFILLS 11/27/17 12/27/17  Lewayne Bunting, MD  metFORMIN (GLUCOPHAGE) 500 MG tablet Take 500 mg by mouth 2 (two) times daily. 04/06/15 07/14/15  [provider]  nitroGLYCERIN (NITROSTAT) 0.4 MG SL tablet PLACE 1 TABLET (0.4 MG TOTAL) UNDER THE TONGUE EVERY 5 (FIVE) MINUTES AS NEEDED FOR CHEST PAIN (MAY REPEAT X 3 THAN CALL 911). 02/02/17   Lewayne Bunting, MD  rosuvastatin (CRESTOR) 40 MG tablet TAKE 1 TABLET BY MOUTH DAILY 02/20/17   Lewayne Bunting, MD    Physical Exam: Vitals:   12/05/17 1424 12/05/17 1430 12/05/17 1500  BP:  127/77 135/84  Pulse:  (!) 55 (!) 56  Resp:  13 (!) 22  SpO2:  99% 96%  Weight: 113.4 kg (250 lb)    Height: 6\' 2"  (1.88 m)       General:  Appears calm and comfortable Eyes:  PERRL, EOMI, normal lids, iris ENT:  grossly normal hearing, lips & tongue, mmm Neck: no LAD, masses or thyromegaly Cardiovascular: RRR, no m/r/g. No LE edema.  Respiratory:  CTA bilaterally, no w/r/r. Normal respiratory effort. Abdomen: soft, ntnd, NABS Skin: no rash or induration seen on limited exam Musculoskeletal:  grossly normal tone BUE/BLE, good ROM, no bony  abnormality Psychiatric:  grossly normal mood and affect, speech fluent and appropriate, AOx3 Neurologic:  CN 2-12 grossly intact, moves all extremities in coordinated fashion, sensation intact  Labs on Admission: I have personally reviewed following labs and imaging studies  CBC:  Recent Labs  Lab 12/05/17 1426  WBC 8.0  HGB 14.8  HCT 43.1  MCV 91.7  PLT 177   Basic Metabolic Panel: Recent Labs  Lab 12/05/17 1426  NA 140  K 4.0  CL 106  CO2 26  GLUCOSE 120*  BUN 7  CREATININE 0.82  CALCIUM 9.3   GFR: Estimated Creatinine Clearance: 126.7 mL/min (by C-G formula based on SCr of 0.82 mg/dL). Liver Function Tests: No results for input(s): AST, ALT, ALKPHOS, BILITOT, PROT, ALBUMIN in the last 168 hours. No results for input(s): LIPASE, AMYLASE in the last 168 hours. No results for input(s): AMMONIA in the last 168 hours. Coagulation Profile: No results for input(s): INR, PROTIME in the last 168 hours. Cardiac Enzymes: No results for input(s): CKTOTAL, CKMB, CKMBINDEX, TROPONINI in the last 168 hours. BNP (last 3 results) No results for input(s): PROBNP in the last 8760 hours. HbA1C: No results for input(s): HGBA1C in the last 72 hours. CBG: No results for input(s): GLUCAP in the last 168 hours. Lipid Profile: No results for input(s): CHOL, HDL, LDLCALC, TRIG, CHOLHDL, LDLDIRECT in the last 72 hours. Thyroid Function Tests: No results for input(s): TSH, T4TOTAL, FREET4, T3FREE, THYROIDAB in the last 72 hours. Anemia Panel: No results for input(s): VITAMINB12, FOLATE, FERRITIN, TIBC, IRON, RETICCTPCT in the last 72 hours. Urine analysis:    Component Value Date/Time   COLORURINE YELLOW 07/23/2009 1624   APPEARANCEUR CLEAR 07/23/2009 1624   LABSPEC 1.009 07/23/2009 1624   PHURINE 6.0 07/23/2009 1624   GLUCOSEU NEGATIVE 07/23/2009 1624   HGBUR NEGATIVE 07/23/2009 1624   BILIRUBINUR NEGATIVE 07/23/2009 1624   KETONESUR NEGATIVE 07/23/2009 1624   PROTEINUR NEGATIVE  07/23/2009 1624   UROBILINOGEN 0.2 07/23/2009 1624   NITRITE NEGATIVE 07/23/2009 1624   LEUKOCYTESUR  07/23/2009 1624    NEGATIVE MICROSCOPIC NOT DONE ON URINES WITH NEGATIVE PROTEIN, BLOOD, LEUKOCYTES, NITRITE, OR GLUCOSE <1000 mg/dL.    Creatinine Clearance: Estimated Creatinine Clearance: 126.7 mL/min (by C-G formula based on SCr of 0.82 mg/dL).  Sepsis Labs: @LABRCNTIP (procalcitonin:4,lacticidven:4) )No results found for this or any previous visit (from the past 240 hour(s)).   Radiological Exams on Admission: No results found.  EKG: Independently reviewed.   Assessment/Plan   1)Chest pain Pt admitted for repeat troponin's, Pt on cardiac monitor. Cardiology consulted for evaluation   2)Coronary artery disease hx of anterior MI in May 2010 ,DES placed in LAD. July 2014 nstemi, cardiac cath showed patent stent  Cardiology to consult today.   3)Hyperlipedemia, Pt has been on crestor.  Continue crestor  4) Hypertension Continue current hypertensive medications   5) Type 2 Diabetes Mellitus  Pt has been treated with metformin.metformin held,  Monitor CBg's  Sliding scale insulin as needed.   6)Ischemic Cardiomyopathy Pt had an echo 11/2013 which showed and EF of 45-50%.    DVT prophylaxis:  Code Status: full Family Communication: wife here with pt  Disposition Plan: Admission Consults called: [facility].  I spoke with Rosann Auerbach who will have Cardiology consult on pt Admission status:  inpatient   Langston Masker PA-C [facility]  If 7PM-7AM, please contact night-coverage [email or web address] Password [facility]  12/05/2017, 3:51 PM

## 2017-12-05 NOTE — Progress Notes (Signed)
Ordered pt dinner

## 2017-12-05 NOTE — Progress Notes (Signed)
ANTICOAGULATION CONSULT NOTE - Initial Consult  Pharmacy Consult for Heparin Indication: chest pain/ACS  No Known Allergies  Patient Measurements: Height: 6\' 2"  (188 cm) Weight: 250 lb (113.4 kg) IBW/kg (Calculated) : 82.2 Heparin Dosing Weight: 105.9 kg  Vital Signs: BP: 129/74 (05/08 1630) Pulse Rate: 52 (05/08 1630)  Labs: Recent Labs    12/05/17 1426  HGB 14.8  HCT 43.1  PLT 177  CREATININE 0.82   Estimated Creatinine Clearance: 126.7 mL/min (by C-G formula based on SCr of 0.82 mg/dL).  Medical History: Past Medical History:  Diagnosis Date  . 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   . Chronic systolic heart failure (HCC)   . History of cardiac cath    2  . Hx of cardiovascular stress test 2015   Lexiscan Myoview (11/2013):  Anteroseptal, apical anterior, apex, apical inferior scar; no ischemia, EF 50%; Intermediate Risk  . Hyperlipidemia   . Ischemic cardiomyopathy    a.Echo (11/2013):  EF 45-50%, apical HK  . Obese   . Pancreatitis    Assessment: 30 yoM presenting with chest pain. Initial EKG and troponin negative for STEMI. Pharmacy consulted to dose IV heparin for r/o ACS. No AC PTA. CBC WNL, no bleeding noted.  Goal of Therapy:  Heparin level 0.3-0.7 units/ml Monitor platelets by anticoagulation protocol: Yes   Plan:  Give heparin 4000 units bolus x 1 Start heparin infusion at 1400 units/hr Check heparin level in 6 hours Daily heparin level and CBC Monitor for s/sx of bleeding  Martin Frank N. Zigmund Daniel, PharmD PGY1 Pharmacy Resident Pager: [phone] 12/05/2017,4:45 PM

## 2017-12-05 NOTE — ED Notes (Signed)
Attempted report x1. 

## 2017-12-06 ENCOUNTER — Ambulatory Visit (HOSPITAL_COMMUNITY)
Admission: EM | Disposition: A | Discharge status: Home / Self Care | Payer: Self-pay | Source: Home / Self Care | Attending: Emergency Medicine

## 2017-12-06 ENCOUNTER — Other Ambulatory Visit (HOSPITAL_COMMUNITY): Payer: BLUE CROSS/BLUE SHIELD

## 2017-12-06 DIAGNOSIS — R079 Chest pain, unspecified: Secondary | ICD-10-CM | POA: Diagnosis not present

## 2017-12-06 DIAGNOSIS — R7303 Prediabetes: Secondary | ICD-10-CM

## 2017-12-06 DIAGNOSIS — E1169 Type 2 diabetes mellitus with other specified complication: Secondary | ICD-10-CM | POA: Diagnosis not present

## 2017-12-06 DIAGNOSIS — E119 Type 2 diabetes mellitus without complications: Secondary | ICD-10-CM | POA: Diagnosis not present

## 2017-12-06 DIAGNOSIS — I2510 Atherosclerotic heart disease of native coronary artery without angina pectoris: Secondary | ICD-10-CM | POA: Diagnosis not present

## 2017-12-06 DIAGNOSIS — I255 Ischemic cardiomyopathy: Secondary | ICD-10-CM | POA: Diagnosis not present

## 2017-12-06 DIAGNOSIS — I10 Essential (primary) hypertension: Secondary | ICD-10-CM | POA: Diagnosis not present

## 2017-12-06 DIAGNOSIS — I5022 Chronic systolic (congestive) heart failure: Secondary | ICD-10-CM | POA: Diagnosis not present

## 2017-12-06 DIAGNOSIS — I200 Unstable angina: Secondary | ICD-10-CM | POA: Diagnosis not present

## 2017-12-06 DIAGNOSIS — I2583 Coronary atherosclerosis due to lipid rich plaque: Secondary | ICD-10-CM

## 2017-12-06 DIAGNOSIS — E785 Hyperlipidemia, unspecified: Secondary | ICD-10-CM

## 2017-12-06 HISTORY — PX: LEFT HEART CATH AND CORONARY ANGIOGRAPHY: CATH118249

## 2017-12-06 LAB — CBC
HCT: 43.9 % (ref 39.0–52.0)
Hemoglobin: 14.9 g/dL (ref 13.0–17.0)
MCH: 31.4 pg (ref 26.0–34.0)
MCHC: 33.9 g/dL (ref 30.0–36.0)
MCV: 92.4 fL (ref 78.0–100.0)
PLATELETS: 172 10*3/uL (ref 150–400)
RBC: 4.75 MIL/uL (ref 4.22–5.81)
RDW: 13.3 % (ref 11.5–15.5)
WBC: 6.9 10*3/uL (ref 4.0–10.5)

## 2017-12-06 LAB — BASIC METABOLIC PANEL
ANION GAP: 10 (ref 5–15)
BUN: 9 mg/dL (ref 6–20)
CO2: 23 mmol/L (ref 22–32)
Calcium: 8.9 mg/dL (ref 8.9–10.3)
Chloride: 106 mmol/L (ref 101–111)
Creatinine, Ser: 0.82 mg/dL (ref 0.61–1.24)
Glucose, Bld: 129 mg/dL — ABNORMAL HIGH (ref 65–99)
Potassium: 4.2 mmol/L (ref 3.5–5.1)
SODIUM: 139 mmol/L (ref 135–145)

## 2017-12-06 LAB — RAPID URINE DRUG SCREEN, HOSP PERFORMED
Amphetamines: NOT DETECTED
BARBITURATES: NOT DETECTED
BENZODIAZEPINES: NOT DETECTED
COCAINE: NOT DETECTED
Opiates: NOT DETECTED
TETRAHYDROCANNABINOL: NOT DETECTED

## 2017-12-06 LAB — GLUCOSE, CAPILLARY
GLUCOSE-CAPILLARY: 126 mg/dL — AB (ref 65–99)
GLUCOSE-CAPILLARY: 128 mg/dL — AB (ref 65–99)
GLUCOSE-CAPILLARY: 100 mg/dL — AB (ref 65–99)
GLUCOSE-CAPILLARY: 231 mg/dL — AB (ref 65–99)
Glucose-Capillary: 92 mg/dL (ref 65–99)
Glucose-Capillary: 121 mg/dL — ABNORMAL HIGH (ref 65–99)

## 2017-12-06 LAB — HIV ANTIBODY (ROUTINE TESTING W REFLEX): HIV SCREEN 4TH GENERATION: NONREACTIVE

## 2017-12-06 LAB — TROPONIN I
Troponin I: <0.03 ng/mL (ref <0.03)
Troponin I: <0.03 ng/mL (ref <0.03)

## 2017-12-06 LAB — LIPID PANEL
CHOL/HDL RATIO: 3.4 ratio
CHOLESTEROL: 144 mg/dL (ref 0–200)
HDL: 42 mg/dL (ref >40)
LDL CALC: 80 mg/dL (ref 0–99)
TRIGLYCERIDES: 110 mg/dL (ref <150)
VLDL: 22 mg/dL (ref 0–40)

## 2017-12-06 LAB — PROTIME-INR
INR: 1.06
Prothrombin Time: 13.8 seconds (ref 11.4–15.2)

## 2017-12-06 SURGERY — LEFT HEART CATH AND CORONARY ANGIOGRAPHY
Anesthesia: LOCAL

## 2017-12-06 MED ORDER — INSULIN ASPART 100 UNIT/ML ~~LOC~~ SOLN: 0.0000 [IU] | Freq: Three times a day (TID) | SUBCUTANEOUS | Status: DC

## 2017-12-06 MED ORDER — SODIUM CHLORIDE 0.9% FLUSH: 3.0000 mL | INTRAVENOUS | Status: DC | PRN

## 2017-12-06 MED ORDER — VERAPAMIL HCL 2.5 MG/ML IV SOLN
INTRAVENOUS | Status: AC
  Filled 2017-12-06: qty 2

## 2017-12-06 MED ORDER — SODIUM CHLORIDE 0.9 % IV SOLN
INTRAVENOUS | Status: DC
  Administered 2017-12-06: 05:00:00 via INTRAVENOUS

## 2017-12-06 MED ORDER — LIDOCAINE HCL (PF) 1 % IJ SOLN
INTRAMUSCULAR | Status: AC
  Filled 2017-12-06: qty 30

## 2017-12-06 MED ORDER — LIDOCAINE HCL (PF) 1 % IJ SOLN
INTRAMUSCULAR | Status: DC | PRN
  Administered 2017-12-06: 15 mL
  Administered 2017-12-06: 2 mL

## 2017-12-06 MED ORDER — SODIUM CHLORIDE 0.9 % IV SOLN
INTRAVENOUS | Status: AC
  Administered 2017-12-06: 21:00:00 via INTRAVENOUS

## 2017-12-06 MED ORDER — IOPAMIDOL (ISOVUE-370) INJECTION 76%
INTRAVENOUS | Status: DC | PRN
  Administered 2017-12-06: 60 mL

## 2017-12-06 MED ORDER — INSULIN ASPART 100 UNIT/ML ~~LOC~~ SOLN
0.0000 [IU] | Freq: Every day | SUBCUTANEOUS | Status: DC
  Administered 2017-12-06: 5 [IU] via SUBCUTANEOUS

## 2017-12-06 MED ORDER — HEPARIN (PORCINE) IN NACL 2-0.9 UNITS/ML
INTRAMUSCULAR | Status: AC | PRN
  Administered 2017-12-06 (×2): 500 mL

## 2017-12-06 MED ORDER — SODIUM CHLORIDE 0.9 % IV SOLN: 250.0000 mL | INTRAVENOUS | Status: DC | PRN

## 2017-12-06 MED ORDER — ONDANSETRON HCL 4 MG/2ML IJ SOLN: 4.0000 mg | Freq: Four times a day (QID) | INTRAMUSCULAR | Status: DC | PRN

## 2017-12-06 MED ORDER — SODIUM CHLORIDE 0.9% FLUSH
3.0000 mL | Freq: Two times a day (BID) | INTRAVENOUS | Status: DC
  Administered 2017-12-06 – 2017-12-07 (×2): 3 mL via INTRAVENOUS

## 2017-12-06 MED ORDER — HEPARIN (PORCINE) IN NACL 1000-0.9 UT/500ML-% IV SOLN
INTRAVENOUS | Status: AC
  Filled 2017-12-06: qty 1000

## 2017-12-06 MED ORDER — FENTANYL CITRATE (PF) 100 MCG/2ML IJ SOLN
INTRAMUSCULAR | Status: AC
  Filled 2017-12-06: qty 2

## 2017-12-06 MED ORDER — IOPAMIDOL (ISOVUE-370) INJECTION 76%
INTRAVENOUS | Status: AC
  Filled 2017-12-06: qty 100

## 2017-12-06 MED ORDER — MORPHINE SULFATE (PF) 2 MG/ML IV SOLN: 2.0000 mg | INTRAVENOUS | Status: DC | PRN

## 2017-12-06 MED ORDER — ACETAMINOPHEN 325 MG PO TABS: 650.0000 mg | ORAL_TABLET | ORAL | Status: DC | PRN

## 2017-12-06 MED ORDER — ASPIRIN 81 MG PO CHEW
81.0000 mg | CHEWABLE_TABLET | Freq: Every day | ORAL | Status: DC
  Administered 2017-12-07: 81 mg via ORAL
  Filled 2017-12-06: qty 1

## 2017-12-06 MED ORDER — NITROGLYCERIN 1 MG/10 ML FOR IR/CATH LAB
INTRA_ARTERIAL | Status: AC
  Filled 2017-12-06: qty 10

## 2017-12-06 MED ORDER — MIDAZOLAM HCL 2 MG/2ML IJ SOLN
INTRAMUSCULAR | Status: DC | PRN
  Administered 2017-12-06: 1 mg via INTRAVENOUS

## 2017-12-06 MED ORDER — INSULIN ASPART 100 UNIT/ML ~~LOC~~ SOLN
0.0000 [IU] | Freq: Three times a day (TID) | SUBCUTANEOUS | Status: DC
Start: 2017-12-07 — End: 2017-12-07
  Administered 2017-12-07: 3 [IU] via SUBCUTANEOUS
  Administered 2017-12-07: 2 [IU] via SUBCUTANEOUS

## 2017-12-06 MED ORDER — MIDAZOLAM HCL 2 MG/2ML IJ SOLN
INTRAMUSCULAR | Status: AC
  Filled 2017-12-06: qty 2

## 2017-12-06 MED ORDER — FENTANYL CITRATE (PF) 100 MCG/2ML IJ SOLN
INTRAMUSCULAR | Status: DC | PRN
  Administered 2017-12-06: 25 ug via INTRAVENOUS

## 2017-12-06 MED ORDER — HEPARIN SODIUM (PORCINE) 1000 UNIT/ML IJ SOLN
INTRAMUSCULAR | Status: AC
  Filled 2017-12-06: qty 1

## 2017-12-06 SURGICAL SUPPLY — 12 items
CATH INFINITI 5FR MULTPACK ANG (CATHETERS) ×2 IMPLANT
GLIDESHEATH SLEND A-KIT 6F 22G (SHEATH) ×2 IMPLANT
GUIDEWIRE INQWIRE 1.5J.035X260 (WIRE) ×1 IMPLANT
INQWIRE 1.5J .035X260CM (WIRE) ×2
KIT HEART LEFT (KITS) ×2 IMPLANT
PACK CARDIAC CATHETERIZATION (CUSTOM PROCEDURE TRAY) ×2 IMPLANT
SHEATH PINNACLE 5F 10CM (SHEATH) ×2 IMPLANT
SYR MEDRAD MARK V 150ML (SYRINGE) ×2 IMPLANT
TRANSDUCER W/STOPCOCK (MISCELLANEOUS) ×2 IMPLANT
TUBING CIL FLEX 10 FLL-RA (TUBING) ×2 IMPLANT
WIRE EMERALD 3MM-J .035X150CM (WIRE) ×2 IMPLANT
WIRE HI TORQ VERSACORE-J 145CM (WIRE) ×2 IMPLANT

## 2017-12-06 NOTE — H&P (View-Only) (Signed)
PROGRESS NOTE    Martin Frank  Martin Frank:[id number] DOB: May 19, 1956 DOA: 12/05/2017 PCP: [name], MD      Brief Narrative:  Martin Frank is a 62 y.o. male with a Past Medical History of CAD s/p DES x 2; HLD; and chronic systolic heart failure who presents with chest pain - substernal, ?exertional, relieved with NTG; pain recurred and was again relieved by NTG.  Last stress test was in 5/15 and c/w prior heart attack without ischemia.  Last cath was in 7/14 and showed nonobstructive CAD.  Last echo was in 5/15 and showed EF 45-50%.  Exam unremarkable.   Assessment & Plan:  Chest pain History of coronary disease Chronic systolic CHF Troponins negative. ECG unremarkable. Chest pain resolved and iwthout recurrence here in the hospital. CXR clear.   No symptoms of CHF exacerbation.  LDL not at goal on maximal Crestor. -Continue aspirin downto 81 mg -Continue BB, ACEi, statin -Defer furhter lipid therapy to cardiology   Pre-Diabetes Patient's doctor has told him he has pre-diabetes, for which he is on metformin. A1c here correlates. -Hold metformin -Continue SSI in hospital -Followu up with Dr. Everlene Other  HTN BP well controlled here, HR 50s overnight. -Continue BB and ACEi  Other medications -Continue buspar, SSRI -Continue famotidine    DVT prophylaxis: padua 2, low risk, outpt sttus Code Status: FULL Family Communication: None present MDM and disposition Plan: The below labs and imaging reports were reviewed and summarized above.  The patient's status is clinically stable.  He was admitted for chest pain, Cardiology suspect UA.  Catheterization planned today.   Consultants:   Cardiology  Procedures:   LHC today  Antimicrobials:   None    Subjective: Feels okay.  No recurrence of chest pain.  No dyspnea, vomiting, diarrhea, nausea.   no confusion, cough, sputum.  No orthopnea.  No leg swelling.  Objective: Vitals:   12/05/17 1910 12/05/17 2053 12/06/17  0508 12/06/17 0826  BP: (!) 141/89 131/82 132/86 136/89  Pulse: (!) 52  (!) 56 60  Resp: 16  16   Temp: 97.8 F (36.6 C)  (!) 97.5 F (36.4 C)   TempSrc: Oral  Oral   SpO2: 95%  93%   Weight:   115.5 kg (254 lb 9.6 oz)   Height:        Intake/Output Summary (Last 24 hours) at 12/06/2017 0844 Last data filed at 12/06/2017 5621 Gross per 24 hour  Intake 100 ml  Output 500 ml  Net -400 ml   Filed Weights   12/05/17 1424 12/05/17 1842 12/06/17 0508  Weight: 113.4 kg (250 lb) 115.9 kg (255 lb 9.6 oz) 115.5 kg (254 lb 9.6 oz)    Examination: General appearance: Obese adult male, alert and in no acute distress.  Lying Flat in bed, conversational.  HEENT: Anicteric, conjunctiva pink, lids and lashes normal. No nasal deformity, discharge, epistaxis.  Lips moist,  Teeth normal.  Moist, no oral lesions.   Skin: Warm and dry.  No jaundice.  No suspicious rashes or lesions. Cardiac: RRR, nl S1-S2, no murmurs appreciated.  Capillary refill is brisk.  JVP normal.  No LE edema.  Radial pulses 2+ and symmetric. Respiratory: Normal respiratory rate and rhythm.  CTAB without rales or wheezes. Abdomen: Abdomen soft.  no TTP. No ascites, distension, hepatosplenomegaly.   MSK: No deformities or effusions. Neuro: Awake and alert.  EOMI, moves all extremities. Speech fluent.    Psych: Sensorium intact and responding to questions, attention normal.  Affect normal.  Judgment and insight appear normal.    Data Reviewed: I have personally reviewed following labs and imaging studies:  CBC: Recent Labs  Lab 12/05/17 1426 12/06/17 0632  WBC 8.0 6.9  HGB 14.8 14.9  HCT 43.1 43.9  MCV 91.7 92.4  PLT 177 172   Basic Metabolic Panel: Recent Labs  Lab 12/05/17 1426 12/06/17 0632  NA 140 139  K 4.0 4.2  CL 106 106  CO2 26 23  GLUCOSE 120* 129*  BUN 7 9  CREATININE 0.82 0.82  CALCIUM 9.3 8.9   GFR: Estimated Creatinine Clearance: 127.8 mL/min (by C-G formula based on SCr of 0.82 mg/dL). Liver  Function Tests: No results for input(s): AST, ALT, ALKPHOS, BILITOT, PROT, ALBUMIN in the last 168 hours. No results for input(s): LIPASE, AMYLASE in the last 168 hours. No results for input(s): AMMONIA in the last 168 hours. Coagulation Profile: Recent Labs  Lab 12/06/17 0632  INR 1.06   Cardiac Enzymes: Recent Labs  Lab 12/05/17 1914 12/06/17 0002 12/06/17 0632  TROPONINI <0.03 <0.03 <0.03   BNP (last 3 results) No results for input(s): PROBNP in the last 8760 hours. HbA1C: Recent Labs    12/05/17 1914  HGBA1C 6.4*   CBG: Recent Labs  Lab 12/05/17 1846 12/05/17 2048 12/06/17 0025 12/06/17 0448  GLUCAP 114* 219* 92 126*   Lipid Profile: Recent Labs    12/06/17 0002  CHOL 144  HDL 42  LDLCALC 80  TRIG 110  CHOLHDL 3.4   Thyroid Function Tests: Recent Labs    12/05/17 1914  TSH 1.810   Anemia Panel: No results for input(s): VITAMINB12, FOLATE, FERRITIN, TIBC, IRON, RETICCTPCT in the last 72 hours. Urine analysis:    Component Value Date/Time   COLORURINE YELLOW 07/23/2009 1624   APPEARANCEUR CLEAR 07/23/2009 1624   LABSPEC 1.009 07/23/2009 1624   PHURINE 6.0 07/23/2009 1624   GLUCOSEU NEGATIVE 07/23/2009 1624   HGBUR NEGATIVE 07/23/2009 1624   BILIRUBINUR NEGATIVE 07/23/2009 1624   KETONESUR NEGATIVE 07/23/2009 1624   PROTEINUR NEGATIVE 07/23/2009 1624   UROBILINOGEN 0.2 07/23/2009 1624   NITRITE NEGATIVE 07/23/2009 1624   LEUKOCYTESUR  07/23/2009 1624    NEGATIVE MICROSCOPIC NOT DONE ON URINES WITH NEGATIVE PROTEIN, BLOOD, LEUKOCYTES, NITRITE, OR GLUCOSE <1000 mg/dL.   Sepsis Labs: @LABRCNTIP (procalcitonin:4,lacticacidven:4)  ) Recent Results (from the past 240 hour(s))  Surgical PCR screen     Status: None   Collection Time: 12/05/17  8:56 PM  Result Value Ref Range Status   MRSA, PCR NEGATIVE NEGATIVE Final   Staphylococcus aureus NEGATIVE NEGATIVE Final    Comment: (NOTE) The Xpert SA Assay (FDA approved for NASAL specimens in  patients 40 years of age and older), is one component of a comprehensive surveillance program. It is not intended to diagnose infection nor to guide or monitor treatment. Performed at [facility], [address], [place], [place] [address]          Radiology Studies: Dg Chest 2 View  Result Date: 12/05/2017 CLINICAL DATA:  Intermittent chest pain centrally and radiating to back beginning today. Four previous heart attacks. EXAM: CHEST - 2 VIEW COMPARISON:  03/30/2015 FINDINGS: Lungs are adequately inflated and otherwise clear. Mild stable cardiomegaly. Remainder the exam is unchanged. IMPRESSION: No acute cardiopulmonary disease. Electronically Signed   By: Elberta Fortis M.D.   On: 12/05/2017 15:54        Scheduled Meds: . aspirin EC  325 mg Oral Daily  . busPIRone  7.5 mg Oral BID  . carvedilol  6.25 mg Oral BID WC  . citalopram  40 mg Oral Daily  . famotidine  20 mg Oral QHS  . insulin aspart  0-15 Units Subcutaneous Q4H  . lisinopril  2.5 mg Oral Daily  . mupirocin ointment  1 application Nasal BID  . rosuvastatin  40 mg Oral Daily  . sodium chloride flush  3 mL Intravenous Q12H   Continuous Infusions: . sodium chloride    . sodium chloride 50 mL/hr at 12/06/17 0501     LOS: 0 days    Time spent: 25 minutes    Alberteen Sam, MD [facility] 12/06/2017, 8:44 AM     Pager [phone] --- please page though AMION:  [email or web address] Password [email or web address] If 7PM-7AM, please contact night-coverage

## 2017-12-06 NOTE — Interval H&P Note (Signed)
Cath Lab Visit (complete for each Cath Lab visit)  Clinical Evaluation Leading to the Procedure:   ACS: Yes.    Non-ACS:    Anginal Classification: CCS III  Anti-ischemic medical therapy: Minimal Therapy (1 class of medications)  Non-Invasive Test Results: No non-invasive testing performed  Prior CABG: No previous CABG      History and Physical Interval Note:  12/06/2017 3:29 PM  Martin Frank  has presented today for surgery, with the diagnosis of unstable angina  The various methods of treatment have been discussed with the patient and family. After consideration of risks, benefits and other options for treatment, the patient has consented to  Procedure(s): LEFT HEART CATH AND CORONARY ANGIOGRAPHY (N/A) as a surgical intervention .  The patient's history has been reviewed, patient examined, no change in status, stable for surgery.  I have reviewed the patient's chart and labs.  Questions were answered to the patient's satisfaction.     Nanetta Batty

## 2017-12-06 NOTE — Progress Notes (Signed)
Site area: rt groin fa sheath Site Prior to Removal:  Level 0 Pressure Applied For: 20 minutes Manual:   yes Patient Status During Pull:  stable Post Pull Site:  Level 0 Post Pull Instructions Given:  yes Post Pull Pulses Present: palpable Dressing Applied:  Gauze and tegaderm Bedrest begins @ 1700 Comments:

## 2017-12-06 NOTE — Progress Notes (Addendum)
Progress Note  Patient Name: Martin Frank Date of Encounter: 12/06/2017  Primary Cardiologist: Olga Millers, MD   Subjective   A little SOB but no chest pain.  Waiting for cath.  Inpatient Medications    Scheduled Meds: . aspirin EC  325 mg Oral Daily  . busPIRone  7.5 mg Oral BID  . carvedilol  6.25 mg Oral BID WC  . citalopram  40 mg Oral Daily  . famotidine  20 mg Oral QHS  . insulin aspart  0-15 Units Subcutaneous Q4H  . lisinopril  2.5 mg Oral Daily  . mupirocin ointment  1 application Nasal BID  . rosuvastatin  40 mg Oral Daily  . sodium chloride flush  3 mL Intravenous Q12H   Continuous Infusions: . sodium chloride    . sodium chloride 50 mL/hr at 12/06/17 0501   PRN Meds: sodium chloride, acetaminophen, gi cocktail, morphine injection, nitroGLYCERIN, ondansetron (ZOFRAN) IV, sodium chloride flush   Vital Signs    Vitals:   12/05/17 2053 12/06/17 0508 12/06/17 0826 12/06/17 1149  BP: 131/82 132/86 136/89 (!) 138/91  Pulse:  (!) 56 60 (!) 54  Resp:  16    Temp:  (!) 97.5 F (36.4 C)  98.4 F (36.9 C)  TempSrc:  Oral  Oral  SpO2:  93%  95%  Weight:  254 lb 9.6 oz (115.5 kg)    Height:        Intake/Output Summary (Last 24 hours) at 12/06/2017 1252 Last data filed at 12/06/2017 0733 Gross per 24 hour  Intake 100 ml  Output 500 ml  Net -400 ml   Filed Weights   12/05/17 1424 12/05/17 1842 12/06/17 0508  Weight: 250 lb (113.4 kg) 255 lb 9.6 oz (115.9 kg) 254 lb 9.6 oz (115.5 kg)    Telemetry    Sinus brady - Personally Reviewed  ECG    S brady with Q waves inf and ant. No changes  - Personally Reviewed  Physical Exam   GEN: No acute distress.   Neck: No JVD Cardiac: RRR, no murmurs, rubs, or gallops.  Respiratory: Clear to auscultation bilaterally. GI: Soft, nontender, non-distended  MS: No edema; No deformity. Neuro:  Nonfocal  Psych: Normal affect   Labs    Chemistry Recent Labs  Lab 12/05/17 1426 12/06/17 0632  NA 140 139    K 4.0 4.2  CL 106 106  CO2 26 23  GLUCOSE 120* 129*  BUN 7 9  CREATININE 0.82 0.82  CALCIUM 9.3 8.9  GFRNONAA >60 >60  GFRAA >60 >60  ANIONGAP 8 10     Hematology Recent Labs  Lab 12/05/17 1426 12/06/17 0632  WBC 8.0 6.9  RBC 4.70 4.75  HGB 14.8 14.9  HCT 43.1 43.9  MCV 91.7 92.4  MCH 31.5 31.4  MCHC 34.3 33.9  RDW 13.2 13.3  PLT 177 172    Cardiac Enzymes Recent Labs  Lab 12/05/17 1914 12/06/17 0002 12/06/17 0632  TROPONINI <0.03 <0.03 <0.03    Recent Labs  Lab 12/05/17 1445  TROPIPOC 0.00     BNPNo results for input(s): BNP, PROBNP in the last 168 hours.   DDimer No results for input(s): DDIMER in the last 168 hours.   Radiology    Dg Chest 2 View  Result Date: 12/05/2017 CLINICAL DATA:  Intermittent chest pain centrally and radiating to back beginning today. Four previous heart attacks. EXAM: CHEST - 2 VIEW COMPARISON:  03/30/2015 FINDINGS: Lungs are adequately inflated and otherwise clear.  Mild stable cardiomegaly. Remainder the exam is unchanged. IMPRESSION: No acute cardiopulmonary disease. Electronically Signed   By: Elberta Fortis M.D.   On: 12/05/2017 15:54    Cardiac Studies   Waiting for echo   Patient Profile     62 y.o. male with hx of several MIs, with CAD-multiple caths last 2016 at [facility] with patent LAD stent, mod disease R-PDA, small ramus 70-80% similar to 2014, improved EF from 35% to normal, ICM-improved, pancreatitis, and HLD, now admitted with chest pain for cardiac cath today.   Assessment & Plan    Chest pain with neg MI, + CAD.   --EKG without acute changes --for cardiac cath today.  --no pain currently  CAD with hx stent to LAD that was patent in 2016.  One small vessel with 70-80% stenosis and mod disease R-PDA for cath today  ICM from 2014 but normal EF on cath 2016  --echo pending --is on BB and ace  Sinus brady with HR to low 50s. Asymptomatic.  HTN treated, and controlled   HLD on Crestor 40 mg with  LDL 80 and HDL 42    For questions or updates, please contact [facility] Please consult [email or web address] for contact info under Cardiology/STEMI.      Signed, [name], NP  12/06/2017, 12:52 PM     Attending Note:   The patient was seen and examined.  Agree with assessment and plan as noted above.  Changes made to the above note as needed.  Patient seen and independently examined with [name], NP.   We discussed all aspects of the encounter. I agree with the assessment and plan as stated above.  Chest pain: Patient had a heart catheterization today which revealed mild coronary disease.  No culprit lesions.  His cardiac enzymes are negative.  Should be able to go home tomorrow.  2.  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  His ejection fraction normalized at one point but was noted to be mildly depressed today.  He is scheduled for an echocardiogram which should be able to be done tomorrow.  I reemphasized the importance of avoiding salt.  He still eats a fairly salty diet.  Of emphasized the need for diet, exercise, weight loss.  He admits that he still has a bit of work to do with these issues   I have spent a total of 40 minutes with patient reviewing hospital  notes , telemetry, EKGs, labs and examining patient as well as establishing an assessment and plan that was discussed with the patient. > 50% of time was spent in direct patient care.    [name], [name]., MD, [facility] 12/06/2017, 6:03 PM [address]  [address] Office [phone] Pager [phone]

## 2017-12-06 NOTE — Progress Notes (Addendum)
PROGRESS NOTE    Martin Frank  UJW:[id number] DOB: May 19, 1956 DOA: 12/05/2017 PCP: [name], MD      Brief Narrative:  Martin Frank is a 62 y.o. male with a Past Medical History of CAD s/p DES x 2; HLD; and chronic systolic heart failure who presents with chest pain - substernal, ?exertional, relieved with NTG; pain recurred and was again relieved by NTG.  Last stress test was in 5/15 and c/w prior heart attack without ischemia.  Last cath was in 7/14 and showed nonobstructive CAD.  Last echo was in 5/15 and showed EF 45-50%.  Exam unremarkable.   Assessment & Plan:  Chest pain History of coronary disease Chronic systolic CHF Troponins negative. ECG unremarkable. Chest pain resolved and iwthout recurrence here in the hospital. CXR clear.   No symptoms of CHF exacerbation.  LDL not at goal on maximal Crestor. -Continue aspirin downto 81 mg -Continue BB, ACEi, statin -Defer furhter lipid therapy to cardiology   Pre-Diabetes Patient's doctor has told him he has pre-diabetes, for which he is on metformin. A1c here correlates. -Hold metformin -Continue SSI in hospital -Followu up with Dr. Everlene Other  HTN BP well controlled here, HR 50s overnight. -Continue BB and ACEi  Other medications -Continue buspar, SSRI -Continue famotidine    DVT prophylaxis: padua 2, low risk, outpt sttus Code Status: FULL Family Communication: None present MDM and disposition Plan: The below labs and imaging reports were reviewed and summarized above.  The patient's status is clinically stable.  He was admitted for chest pain, Cardiology suspect UA.  Catheterization planned today.   Consultants:   Cardiology  Procedures:   LHC today  Antimicrobials:   None    Subjective: Feels okay.  No recurrence of chest pain.  No dyspnea, vomiting, diarrhea, nausea.   no confusion, cough, sputum.  No orthopnea.  No leg swelling.  Objective: Vitals:   12/05/17 1910 12/05/17 2053 12/06/17  0508 12/06/17 0826  BP: (!) 141/89 131/82 132/86 136/89  Pulse: (!) 52  (!) 56 60  Resp: 16  16   Temp: 97.8 F (36.6 C)  (!) 97.5 F (36.4 C)   TempSrc: Oral  Oral   SpO2: 95%  93%   Weight:   115.5 kg (254 lb 9.6 oz)   Height:        Intake/Output Summary (Last 24 hours) at 12/06/2017 0844 Last data filed at 12/06/2017 5621 Gross per 24 hour  Intake 100 ml  Output 500 ml  Net -400 ml   Filed Weights   12/05/17 1424 12/05/17 1842 12/06/17 0508  Weight: 113.4 kg (250 lb) 115.9 kg (255 lb 9.6 oz) 115.5 kg (254 lb 9.6 oz)    Examination: General appearance: Obese adult male, alert and in no acute distress.  Lying Flat in bed, conversational.  HEENT: Anicteric, conjunctiva pink, lids and lashes normal. No nasal deformity, discharge, epistaxis.  Lips moist,  Teeth normal.  Moist, no oral lesions.   Skin: Warm and dry.  No jaundice.  No suspicious rashes or lesions. Cardiac: RRR, nl S1-S2, no murmurs appreciated.  Capillary refill is brisk.  JVP normal.  No LE edema.  Radial pulses 2+ and symmetric. Respiratory: Normal respiratory rate and rhythm.  CTAB without rales or wheezes. Abdomen: Abdomen soft.  no TTP. No ascites, distension, hepatosplenomegaly.   MSK: No deformities or effusions. Neuro: Awake and alert.  EOMI, moves all extremities. Speech fluent.    Psych: Sensorium intact and responding to questions, attention normal.  Affect normal.  Judgment and insight appear normal.    Data Reviewed: I have personally reviewed following labs and imaging studies:  CBC: Recent Labs  Lab 12/05/17 1426 12/06/17 0632  WBC 8.0 6.9  HGB 14.8 14.9  HCT 43.1 43.9  MCV 91.7 92.4  PLT 177 172   Basic Metabolic Panel: Recent Labs  Lab 12/05/17 1426 12/06/17 0632  NA 140 139  K 4.0 4.2  CL 106 106  CO2 26 23  GLUCOSE 120* 129*  BUN 7 9  CREATININE 0.82 0.82  CALCIUM 9.3 8.9   GFR: Estimated Creatinine Clearance: 127.8 mL/min (by C-G formula based on SCr of 0.82 mg/dL). Liver  Function Tests: No results for input(s): AST, ALT, ALKPHOS, BILITOT, PROT, ALBUMIN in the last 168 hours. No results for input(s): LIPASE, AMYLASE in the last 168 hours. No results for input(s): AMMONIA in the last 168 hours. Coagulation Profile: Recent Labs  Lab 12/06/17 0632  INR 1.06   Cardiac Enzymes: Recent Labs  Lab 12/05/17 1914 12/06/17 0002 12/06/17 0632  TROPONINI <0.03 <0.03 <0.03   BNP (last 3 results) No results for input(s): PROBNP in the last 8760 hours. HbA1C: Recent Labs    12/05/17 1914  HGBA1C 6.4*   CBG: Recent Labs  Lab 12/05/17 1846 12/05/17 2048 12/06/17 0025 12/06/17 0448  GLUCAP 114* 219* 92 126*   Lipid Profile: Recent Labs    12/06/17 0002  CHOL 144  HDL 42  LDLCALC 80  TRIG 110  CHOLHDL 3.4   Thyroid Function Tests: Recent Labs    12/05/17 1914  TSH 1.810   Anemia Panel: No results for input(s): VITAMINB12, FOLATE, FERRITIN, TIBC, IRON, RETICCTPCT in the last 72 hours. Urine analysis:    Component Value Date/Time   COLORURINE YELLOW 07/23/2009 1624   APPEARANCEUR CLEAR 07/23/2009 1624   LABSPEC 1.009 07/23/2009 1624   PHURINE 6.0 07/23/2009 1624   GLUCOSEU NEGATIVE 07/23/2009 1624   HGBUR NEGATIVE 07/23/2009 1624   BILIRUBINUR NEGATIVE 07/23/2009 1624   KETONESUR NEGATIVE 07/23/2009 1624   PROTEINUR NEGATIVE 07/23/2009 1624   UROBILINOGEN 0.2 07/23/2009 1624   NITRITE NEGATIVE 07/23/2009 1624   LEUKOCYTESUR  07/23/2009 1624    NEGATIVE MICROSCOPIC NOT DONE ON URINES WITH NEGATIVE PROTEIN, BLOOD, LEUKOCYTES, NITRITE, OR GLUCOSE <1000 mg/dL.   Sepsis Labs: @LABRCNTIP (procalcitonin:4,lacticacidven:4)  ) Recent Results (from the past 240 hour(s))  Surgical PCR screen     Status: None   Collection Time: 12/05/17  8:56 PM  Result Value Ref Range Status   MRSA, PCR NEGATIVE NEGATIVE Final   Staphylococcus aureus NEGATIVE NEGATIVE Final    Comment: (NOTE) The Xpert SA Assay (FDA approved for NASAL specimens in  patients 40 years of age and older), is one component of a comprehensive surveillance program. It is not intended to diagnose infection nor to guide or monitor treatment. Performed at [facility], [address], [place], [place] [address]          Radiology Studies: Dg Chest 2 View  Result Date: 12/05/2017 CLINICAL DATA:  Intermittent chest pain centrally and radiating to back beginning today. Four previous heart attacks. EXAM: CHEST - 2 VIEW COMPARISON:  03/30/2015 FINDINGS: Lungs are adequately inflated and otherwise clear. Mild stable cardiomegaly. Remainder the exam is unchanged. IMPRESSION: No acute cardiopulmonary disease. Electronically Signed   By: Elberta Fortis M.D.   On: 12/05/2017 15:54        Scheduled Meds: . aspirin EC  325 mg Oral Daily  . busPIRone  7.5 mg Oral BID  . carvedilol  6.25 mg Oral BID WC  . citalopram  40 mg Oral Daily  . famotidine  20 mg Oral QHS  . insulin aspart  0-15 Units Subcutaneous Q4H  . lisinopril  2.5 mg Oral Daily  . mupirocin ointment  1 application Nasal BID  . rosuvastatin  40 mg Oral Daily  . sodium chloride flush  3 mL Intravenous Q12H   Continuous Infusions: . sodium chloride    . sodium chloride 50 mL/hr at 12/06/17 0501     LOS: 0 days    Time spent: 25 minutes    Alberteen Sam, MD [facility] 12/06/2017, 8:44 AM     Pager [phone] --- please page though AMION:  [email or web address] Password [email or web address] If 7PM-7AM, please contact night-coverage

## 2017-12-06 NOTE — Progress Notes (Signed)
RN ordered pt a meal tray  

## 2017-12-06 NOTE — Progress Notes (Signed)
Pt taken to cath lab

## 2017-12-07 ENCOUNTER — Encounter (HOSPITAL_COMMUNITY): Payer: Self-pay | Admitting: Cardiovascular Disease

## 2017-12-07 ENCOUNTER — Observation Stay (HOSPITAL_BASED_OUTPATIENT_CLINIC_OR_DEPARTMENT_OTHER): Payer: BLUE CROSS/BLUE SHIELD

## 2017-12-07 DIAGNOSIS — I2510 Atherosclerotic heart disease of native coronary artery without angina pectoris: Secondary | ICD-10-CM | POA: Diagnosis not present

## 2017-12-07 DIAGNOSIS — R079 Chest pain, unspecified: Secondary | ICD-10-CM | POA: Diagnosis not present

## 2017-12-07 DIAGNOSIS — I5022 Chronic systolic (congestive) heart failure: Secondary | ICD-10-CM | POA: Diagnosis not present

## 2017-12-07 DIAGNOSIS — E669 Obesity, unspecified: Secondary | ICD-10-CM

## 2017-12-07 DIAGNOSIS — E1169 Type 2 diabetes mellitus with other specified complication: Secondary | ICD-10-CM | POA: Diagnosis not present

## 2017-12-07 LAB — BASIC METABOLIC PANEL
Anion gap: 9 (ref 5–15)
BUN: 10 mg/dL (ref 6–20)
CHLORIDE: 108 mmol/L (ref 101–111)
CO2: 24 mmol/L (ref 22–32)
CREATININE: 0.91 mg/dL (ref 0.61–1.24)
Calcium: 8.8 mg/dL — ABNORMAL LOW (ref 8.9–10.3)
GFR calc Af Amer: >60 mL/min (ref >60)
GFR calc non Af Amer: >60 mL/min (ref >60)
GLUCOSE: 127 mg/dL — AB (ref 65–99)
Potassium: 4 mmol/L (ref 3.5–5.1)
Sodium: 141 mmol/L (ref 135–145)

## 2017-12-07 LAB — GLUCOSE, CAPILLARY
Glucose-Capillary: 146 mg/dL — ABNORMAL HIGH (ref 65–99)
Glucose-Capillary: 188 mg/dL — ABNORMAL HIGH (ref 65–99)

## 2017-12-07 LAB — CBC
HEMATOCRIT: 44.1 % (ref 39.0–52.0)
HEMOGLOBIN: 14.9 g/dL (ref 13.0–17.0)
MCH: 31.6 pg (ref 26.0–34.0)
MCHC: 33.8 g/dL (ref 30.0–36.0)
MCV: 93.4 fL (ref 78.0–100.0)
Platelets: 174 10*3/uL (ref 150–400)
RBC: 4.72 MIL/uL (ref 4.22–5.81)
RDW: 13.5 % (ref 11.5–15.5)
WBC: 7.8 10*3/uL (ref 4.0–10.5)

## 2017-12-07 LAB — ECHOCARDIOGRAM COMPLETE
HEIGHTINCHES: 74 in
WEIGHTICAEL: 4060.8 [oz_av]

## 2017-12-07 MED ORDER — PERFLUTREN LIPID MICROSPHERE
1.0000 mL | INTRAVENOUS | Status: AC | PRN
  Administered 2017-12-07: 2 mL via INTRAVENOUS
  Filled 2017-12-07: qty 10

## 2017-12-07 NOTE — Progress Notes (Signed)
Pt IVs discontinued, catheter intact and telemetry removed. Pt has all belongings. Pt discharge education provided at bedside with pt and pt wife. Pt discharged via wheelchair with NT.

## 2017-12-07 NOTE — Progress Notes (Signed)
  Echocardiogram 2D Echocardiogram has been performed.  Martin Frank 12/07/2017, 9:30 AM

## 2017-12-08 NOTE — Discharge Summary (Signed)
Physician Discharge Summary  Martin Frank:[id number] DOB: 1955/09/14 DOA: 12/05/2017  PCP: [name], MD  Admit date: 12/05/2017 Discharge date: 12/08/2017  Admitted From: Home  Disposition:  Home   Recommendations for Outpatient Follow-up:  1. Follow up with PCP in 1-2 weeks 2. Follow up with Cardiology in 3-4 weeks  Home Health: None  Equipment/Devices: None  Discharge Condition: Good  CODE STATUS: FULL Diet recommendation: Cardiac  Brief/Interim Summary: [name] a 62 y.o.M with CAD s/p DES x 2 and chronic systolic heart failure who presents with chest pain.  Patient was in his car when the pain started, it was substernal, radiating to neck, had an exertional component as well he thought, and was relieved with NTG, so he came to the ER.  Last stress test was in 5/15 and c/w prior heart attack without ischemia. Last cath was in 7/14 and showed nonobstructive CAD. Last echo was in 5/15 and showed EF 45-50%.      Chest pain Was admitted.  Serial troponins unremarkable.  ECG unchanged from previous.  Cardiology were consulted and recommended LHC, which showed nonobstructive disease, but reduced EF from previous.  Echocardiogram confirmed this.     Prediabetes Counseled on diet and weight loss  Hypertension Well controlled in hosptal on BB, ACEi.     Discharge Diagnoses:  Principal Problem:   Chest pain Active Problems:   CAD (coronary artery disease)   Hyperlipidemia   Chronic systolic heart failure (HCC)   Diabetes mellitus type 2 in obese [facility])   Essential hypertension    Discharge Instructions  Discharge Instructions    Diet - low sodium heart healthy   Complete by:  As directed    Discharge instructions   Complete by:  As directed    From Dr. Maryfrances Bunnell: You were admitted with chest pain.  Like Dr. Allyson Sabal and Dr. Elease Hashimoto said, your cath showed no evidence of a heart attack or of new heart blockages.  However, they did notice that your heart squeeze  was less than before.  Like Dr. Elease Hashimoto discussed with you, it is important to limit your salt intake and fluid intake (no more than 2000 mg of salt/sodium per day, and no more than 2000 mL of fluid per day).  Resume your previous heart regimen: Carvedilol 6.25 mg twice daily to slow the heart Lisinopril 2.5 mg once daily to relieve stress on the heart and lower blood pressure Crestor 40 mg nightly to lower cholesterol  Call Dr. Ludwig Clarks office in the next few days to schedule a sooner Cardiology follow up appointment.   Increase activity slowly   Complete by:  As directed      Allergies as of 12/07/2017   No Known Allergies     Medication List    TAKE these medications   busPIRone 15 MG tablet Commonly known as:  BUSPAR Take 7.5 mg by mouth 2 (two) times daily.   carvedilol 6.25 MG tablet Commonly known as:  COREG TAKE 1 TABLET (6.25 MG) BY MOUTH 2 TIMES A DAY   citalopram 40 MG tablet Commonly known as:  CELEXA Take 40 mg by mouth daily.   famotidine 20 MG tablet Commonly known as:  PEPCID Take 20 mg by mouth at bedtime.   lisinopril 2.5 MG tablet Commonly known as:  PRINIVIL,ZESTRIL Take 1 tablet (2.5 mg total) by mouth daily. PLEASE CONTACT OFFICE FOR ADDITIONAL REFILLS What changed:  additional instructions   nitroGLYCERIN 0.4 MG SL tablet Commonly known as:  NITROSTAT  PLACE 1 TABLET (0.4 MG TOTAL) UNDER THE TONGUE EVERY 5 (FIVE) MINUTES AS NEEDED FOR CHEST PAIN (MAY REPEAT X 3 THAN CALL 911).   rosuvastatin 40 MG tablet Commonly known as:  CRESTOR TAKE 1 TABLET BY MOUTH DAILY   UNABLE TO FIND CBD oil: Use 1 dropperful sublingually once a day   UNABLE TO FIND SuperBeets liquid: Drink 6 ounces by mouth once a day      Follow-up Information    [name], MD. Go on 12/14/2017.   Specialty:  Family Medicine Why:  @ 7:30 am to fill out new pt paperwork.... also office is located on the backside of the building Contact information: [address] [place] [address] [phone]          No Known Allergies  Consultations:  Cardiology   Procedures/Studies: Dg Chest 2 View  Result Date: 12/05/2017 CLINICAL DATA:  Intermittent chest pain centrally and radiating to back beginning today. Four previous heart attacks. EXAM: CHEST - 2 VIEW COMPARISON:  03/30/2015 FINDINGS: Lungs are adequately inflated and otherwise clear. Mild stable cardiomegaly. Remainder the exam is unchanged. IMPRESSION: No acute cardiopulmonary disease. Electronically Signed   By: Elberta Fortis M.D.   On: 12/05/2017 15:54       Subjective: Feels well.  No further pain.  No dyspnea, confusion.  No leg swelling, orthopnea, nocturnal dyspnea.    Discharge Exam: Vitals:   12/07/17 0520 12/07/17 0823  BP: 133/86 124/85  Pulse: (!) 56 (!) 56  Resp:    Temp: 98 F (36.7 C)   SpO2: 94%    Vitals:   12/06/17 2101 12/07/17 0500 12/07/17 0520 12/07/17 0823  BP: 121/84  133/86 124/85  Pulse: (!) 58  (!) 56 (!) 56  Resp: 13 16    Temp: 98.1 F (36.7 C)  98 F (36.7 C)   TempSrc: Oral  Oral   SpO2: 97%  94%   Weight:  115.1 kg (253 lb 12.8 oz)    Height:        General: Pt is alert, awake, not in acute distress Cardiovascular: RRR, S1/S2 +, no rubs, no gallops Respiratory: CTA bilaterally, no wheezing, no rhonchi Abdominal: Soft, NT, ND, bowel sounds + Extremities: no edema, no cyanosis    The results of significant diagnostics from this hospitalization (including imaging, microbiology, ancillary and laboratory) are listed below for reference.     Microbiology: Recent Results (from the past 240 hour(s))  Surgical PCR screen     Status: None   Collection Time: 12/05/17  8:56 PM  Result Value Ref Range Status   MRSA, PCR NEGATIVE NEGATIVE Final   Staphylococcus aureus NEGATIVE NEGATIVE Final    Comment: (NOTE) The Xpert SA Assay (FDA approved for NASAL specimens in patients 11 years of age and older), is one component of a  comprehensive surveillance program. It is not intended to diagnose infection nor to guide or monitor treatment. Performed at [facility], [address] [place], [place] [address]      Labs: BNP (last 3 results) No results for input(s): BNP in the last 8760 hours. Basic Metabolic Panel: Recent Labs  Lab 12/05/17 1426 12/06/17 0632 12/07/17 0612  NA 140 139 141  K 4.0 4.2 4.0  CL 106 106 108  CO2 26 23 24   GLUCOSE 120* 129* 127*  BUN 7 9 10   CREATININE 0.82 0.82 0.91  CALCIUM 9.3 8.9 8.8*   Liver Function Tests: No results for input(s):  AST, ALT, ALKPHOS, BILITOT, PROT, ALBUMIN in the last 168 hours. No results for input(s): LIPASE, AMYLASE in the last 168 hours. No results for input(s): AMMONIA in the last 168 hours. CBC: Recent Labs  Lab 12/05/17 1426 12/06/17 0632 12/07/17 0612  WBC 8.0 6.9 7.8  HGB 14.8 14.9 14.9  HCT 43.1 43.9 44.1  MCV 91.7 92.4 93.4  PLT 177 172 174   Cardiac Enzymes: Recent Labs  Lab 12/05/17 1914 12/06/17 0002 12/06/17 0632  TROPONINI <0.03 <0.03 <0.03   BNP: Invalid input(s): POCBNP CBG: Recent Labs  Lab 12/06/17 1148 12/06/17 1700 12/06/17 2148 12/07/17 0739 12/07/17 1212  GLUCAP 121* 100* 231* 146* 188*   D-Dimer No results for input(s): DDIMER in the last 72 hours. Hgb A1c Recent Labs    12/05/17 1914  HGBA1C 6.4*   Lipid Profile Recent Labs    12/06/17 0002  CHOL 144  HDL 42  LDLCALC 80  TRIG 110  CHOLHDL 3.4   Thyroid function studies Recent Labs    12/05/17 1914  TSH 1.810   Anemia work up No results for input(s): VITAMINB12, FOLATE, FERRITIN, TIBC, IRON, RETICCTPCT in the last 72 hours. Urinalysis    Component Value Date/Time   COLORURINE YELLOW 07/23/2009 1624   APPEARANCEUR CLEAR 07/23/2009 1624   LABSPEC 1.009 07/23/2009 1624   PHURINE 6.0 07/23/2009 1624   GLUCOSEU NEGATIVE 07/23/2009 1624   HGBUR NEGATIVE 07/23/2009 1624   BILIRUBINUR NEGATIVE 07/23/2009 1624   KETONESUR  NEGATIVE 07/23/2009 1624   PROTEINUR NEGATIVE 07/23/2009 1624   UROBILINOGEN 0.2 07/23/2009 1624   NITRITE NEGATIVE 07/23/2009 1624   LEUKOCYTESUR  07/23/2009 1624    NEGATIVE MICROSCOPIC NOT DONE ON URINES WITH NEGATIVE PROTEIN, BLOOD, LEUKOCYTES, NITRITE, OR GLUCOSE <1000 mg/dL.   Sepsis Labs Invalid input(s): PROCALCITONIN,  WBC,  LACTICIDVEN Microbiology Recent Results (from the past 240 hour(s))  Surgical PCR screen     Status: None   Collection Time: 12/05/17  8:56 PM  Result Value Ref Range Status   MRSA, PCR NEGATIVE NEGATIVE Final   Staphylococcus aureus NEGATIVE NEGATIVE Final    Comment: (NOTE) The Xpert SA Assay (FDA approved for NASAL specimens in patients 61 years of age and older), is one component of a comprehensive surveillance program. It is not intended to diagnose infection nor to guide or monitor treatment. Performed at [facility], [address] [place], [place] [address]      Time coordinating discharge: 35 minutes  SIGNED:   Alberteen Sam, MD  [facility] 12/07/2017, 3:10 PM

## 2017-12-11 MED FILL — Heparin Sod (Porcine)-NaCl IV Soln 1000 Unit/500ML-0.9%: INTRAVENOUS | Qty: 1000 | Status: AC

## 2017-12-12 DIAGNOSIS — Z8719 Personal history of other diseases of the digestive system: Secondary | ICD-10-CM | POA: Insufficient documentation

## 2017-12-12 DIAGNOSIS — Z87891 Personal history of nicotine dependence: Secondary | ICD-10-CM

## 2017-12-12 DIAGNOSIS — Z955 Presence of coronary angioplasty implant and graft: Secondary | ICD-10-CM | POA: Insufficient documentation

## 2017-12-12 HISTORY — DX: Personal history of nicotine dependence: Z87.891

## 2017-12-14 MED FILL — metFORMIN HCL 500 MG TABS: 500 | 90 days supply | Qty: 180 | Fill #0

## 2017-12-17 ENCOUNTER — Other Ambulatory Visit: Payer: Self-pay | Admitting: Cardiology

## 2017-12-17 MED FILL — CARVEDILOL 6.25 MG TAB: 6.25 | 30 days supply | Qty: 60 | Fill #0

## 2017-12-17 MED FILL — ROSUVASTATIN CALCIUM 40 MG: 40 | 30 days supply | Qty: 30 | Fill #0

## 2017-12-17 NOTE — Telephone Encounter (Signed)
Rx has been sent to the pharmacy electronically. ° °

## 2017-12-25 DIAGNOSIS — I2510 Atherosclerotic heart disease of native coronary artery without angina pectoris: Secondary | ICD-10-CM | POA: Diagnosis not present

## 2017-12-25 DIAGNOSIS — T7840XA Allergy, unspecified, initial encounter: Secondary | ICD-10-CM | POA: Insufficient documentation

## 2017-12-25 DIAGNOSIS — I110 Hypertensive heart disease with heart failure: Secondary | ICD-10-CM | POA: Insufficient documentation

## 2017-12-25 DIAGNOSIS — Z9049 Acquired absence of other specified parts of digestive tract: Secondary | ICD-10-CM | POA: Insufficient documentation

## 2017-12-25 DIAGNOSIS — Z87891 Personal history of nicotine dependence: Secondary | ICD-10-CM | POA: Diagnosis not present

## 2017-12-25 DIAGNOSIS — I252 Old myocardial infarction: Secondary | ICD-10-CM | POA: Diagnosis not present

## 2017-12-25 DIAGNOSIS — I5022 Chronic systolic (congestive) heart failure: Secondary | ICD-10-CM | POA: Insufficient documentation

## 2017-12-25 DIAGNOSIS — F329 Major depressive disorder, single episode, unspecified: Secondary | ICD-10-CM | POA: Insufficient documentation

## 2017-12-25 DIAGNOSIS — Z955 Presence of coronary angioplasty implant and graft: Secondary | ICD-10-CM | POA: Insufficient documentation

## 2017-12-25 DIAGNOSIS — Z79899 Other long term (current) drug therapy: Secondary | ICD-10-CM | POA: Insufficient documentation

## 2017-12-25 DIAGNOSIS — E119 Type 2 diabetes mellitus without complications: Secondary | ICD-10-CM | POA: Insufficient documentation

## 2017-12-25 DIAGNOSIS — R220 Localized swelling, mass and lump, head: Secondary | ICD-10-CM | POA: Diagnosis present

## 2017-12-26 ENCOUNTER — Emergency Department (HOSPITAL_BASED_OUTPATIENT_CLINIC_OR_DEPARTMENT_OTHER)
Admission: EM | Admit: 2017-12-26 | Discharge: 2017-12-26 | Disposition: A | Discharge status: Home / Self Care | Payer: BLUE CROSS/BLUE SHIELD | Attending: Emergency Medicine | Admitting: Emergency Medicine

## 2017-12-26 ENCOUNTER — Other Ambulatory Visit: Payer: Self-pay

## 2017-12-26 ENCOUNTER — Encounter (HOSPITAL_BASED_OUTPATIENT_CLINIC_OR_DEPARTMENT_OTHER): Payer: Self-pay

## 2017-12-26 DIAGNOSIS — T7840XA Allergy, unspecified, initial encounter: Secondary | ICD-10-CM

## 2017-12-26 MED ORDER — DIPHENHYDRAMINE HCL 50 MG/ML IJ SOLN
INTRAMUSCULAR | Status: AC
  Filled 2017-12-26: qty 1

## 2017-12-26 MED ORDER — DIPHENHYDRAMINE HCL 50 MG/ML IJ SOLN
25.0000 mg | Freq: Once | INTRAMUSCULAR | Status: AC
  Administered 2017-12-26: 25 mg via INTRAVENOUS

## 2017-12-26 MED ORDER — FAMOTIDINE IN NACL 20-0.9 MG/50ML-% IV SOLN
20.0000 mg | Freq: Once | INTRAVENOUS | Status: AC
  Administered 2017-12-26: 20 mg via INTRAVENOUS

## 2017-12-26 MED ORDER — PREDNISONE 10 MG PO TABS: 20.0000 mg | ORAL_TABLET | Freq: Two times a day (BID) | ORAL | 0 refills | Status: DC

## 2017-12-26 MED ORDER — FAMOTIDINE IN NACL 20-0.9 MG/50ML-% IV SOLN
INTRAVENOUS | Status: AC
  Filled 2017-12-26: qty 50

## 2017-12-26 MED ORDER — METHYLPREDNISOLONE SODIUM SUCC 125 MG IJ SOLR
80.0000 mg | Freq: Once | INTRAMUSCULAR | Status: AC
  Administered 2017-12-26: 80 mg via INTRAVENOUS

## 2017-12-26 MED ORDER — METHYLPREDNISOLONE SODIUM SUCC 125 MG IJ SOLR
INTRAMUSCULAR | Status: AC
  Filled 2017-12-26: qty 2

## 2017-12-26 MED ORDER — KETOROLAC TROMETHAMINE 30 MG/ML IJ SOLN
30.0000 mg | Freq: Once | INTRAMUSCULAR | Status: AC
  Administered 2017-12-26: 30 mg via INTRAVENOUS
  Filled 2017-12-26: qty 1

## 2017-12-26 MED FILL — predniSONE 10 MG TABS: 10 | 3 days supply | Qty: 12 | Fill #0

## 2017-12-26 NOTE — Discharge Instructions (Addendum)
Prednisone as prescribed.  Benadryl 25 mg every 6 hours for the next 3 days.  Return to the emergency department if you develop difficulty breathing, chest pain, throat swelling, or other new and concerning symptoms.

## 2017-12-26 NOTE — ED Notes (Signed)
Pt states that he feels a little better, and his left eye appears less swollen

## 2017-12-26 NOTE — ED Notes (Signed)
ED Provider at bedside. 

## 2017-12-26 NOTE — ED Triage Notes (Signed)
Pt c/o bilateral eye swelling since 1600 after getting stung by multiple insects to forehead, pt took 50mg  total of oral benadryl 1800, eyes are both swollen shut and states they are getting progressively worse

## 2017-12-26 NOTE — ED Provider Notes (Signed)
[facility] EMERGENCY DEPARTMENT Provider Note   CSN: [id number] Arrival date & time: 12/25/17  2357     History   Chief Complaint Chief Complaint  Patient presents with  . Facial Swelling    HPI Martin Frank is a 62 y.o. male.  Patient presents with complaints of swelling to the forehead and eyelids that started earlier today after being bitten by an unknown insect at the pool.  He denies any difficulty breathing or swallowing.  No fevers or chills.  He has taken benadryl with minimal relief.  The history is provided by the patient.    Past Medical History:  Diagnosis Date  . Anterior myocardial infarction (HCC) 11/2008  . 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   . Chronic systolic heart failure (HCC)   . Depression   . Hx of cardiovascular stress test 2015   Lexiscan Myoview (11/2013):  Anteroseptal, apical anterior, apex, apical inferior scar; no ischemia, EF 50%; Intermediate Risk  . Hyperlipidemia   . Hypertension   . Ischemic cardiomyopathy    a.Echo (11/2013):  EF 45-50%, apical HK  . MI (myocardial infarction) (HCC) ? date MI #2; 2014 MI #3  . Obese   . Pancreatitis   . Type II diabetes mellitus [facility])     Patient Active Problem List   Diagnosis Date Noted  . Unstable angina (HCC)   . Diabetes mellitus type 2 in obese (HCC) 04/15/2015  . Essential hypertension 04/15/2015  . Chronic systolic heart failure (HCC) 12/29/2013  . Cardiomyopathy, ischemic 05/07/2013  . CAD (coronary artery disease)   . Hyperlipidemia   . Obese   . Pancreatitis   . Chest pain     Past Surgical History:  Procedure Laterality Date  . CARDIAC CATHETERIZATION  X2  . CORONARY ANGIOPLASTY WITH STENT PLACEMENT  5/2010X 2   "1+1" (12/05/2017)  . LAPAROSCOPIC CHOLECYSTECTOMY    .  LEFT HEART CATH AND CORONARY ANGIOGRAPHY N/A 12/06/2017   Procedure: LEFT HEART CATH AND CORONARY ANGIOGRAPHY;  Surgeon: Runell Gess, MD;  Location: [facility] INVASIVE CV LAB;  Service: Cardiovascular;  Laterality: N/A;  . ORCHIECTOMY Right 1960s        Home Medications    Prior to Admission medications   Medication Sig Start Date End Date Taking? Authorizing Provider  busPIRone (BUSPAR) 15 MG tablet Take 7.5 mg by mouth 2 (two) times daily. 10/24/17   [provider]  carvedilol (COREG) 6.25 MG tablet TAKE 1 TABLET BY MOUTH TWICE DAILY 12/17/17   Lewayne Bunting, MD  citalopram (CELEXA) 40 MG tablet Take 40 mg by mouth daily.  12/10/12   [provider]  famotidine (PEPCID) 20 MG tablet Take 20 mg by mouth at bedtime.     [provider]  lisinopril (PRINIVIL,ZESTRIL) 2.5 MG tablet Take 1 tablet (2.5 mg total) by mouth daily. PLEASE CONTACT OFFICE FOR ADDITIONAL REFILLS Patient taking differently: Take 2.5 mg by mouth daily.  11/27/17 12/27/17  Lewayne Bunting, MD  nitroGLYCERIN (NITROSTAT) 0.4 MG SL tablet PLACE 1 TABLET (0.4 MG TOTAL) UNDER THE TONGUE EVERY 5 (FIVE) MINUTES AS NEEDED FOR CHEST PAIN (MAY REPEAT X 3 THAN CALL 911). 02/02/17   Lewayne Bunting, MD  rosuvastatin (CRESTOR) 40 MG tablet TAKE 1 TABLET BY  MOUTH DAILY 12/17/17   Lewayne Bunting, MD  UNABLE TO FIND CBD oil: Use 1 dropperful sublingually once a day    [provider]  UNABLE TO FIND SuperBeets liquid: Drink 6 ounces by mouth once a day    [provider]    Family History Family History  Problem Relation Age of Onset  . CAD Father        MI at age 42  . CAD Brother        MI at age 49    Social History Social History   Tobacco Use  . Smoking status: Former Smoker    Packs/day: 1.50    Years: 10.00    Pack years: 15.00    Types: Cigarettes    Last attempt to quit: 1983    Years since quitting: 36.4  . Smokeless tobacco: Never Used  Substance Use Topics  .  Alcohol use: Yes    Alcohol/week: 1.8 oz    Types: 3 Glasses of wine per week  . Drug use: Not Currently    Comment: 12/05/2017 "nothing since my early 20's"     Allergies   Patient has no known allergies.   Review of Systems Review of Systems  All other systems reviewed and are negative.    Physical Exam Updated Vital Signs BP 123/86 (BP Location: Left Arm)   Pulse 67   Temp 97.8 F (36.6 C) (Oral)   Resp 16   Ht 6\' 2"  (1.88 m)   Wt 114.8 kg (253 lb)   SpO2 95%   BMI 32.48 kg/m   Physical Exam  Constitutional: He is oriented to person, place, and time. He appears well-developed and well-nourished. No distress.  HENT:  Head: Normocephalic and atraumatic.  Mouth/Throat: Oropharynx is clear and moist.  There is swelling of the forehead, both upper and lower eyelids are markedly swollen.  No stridor or oral swelling noted.  Neck: Normal range of motion. Neck supple.  Cardiovascular: Normal rate and regular rhythm. Exam reveals no friction rub.  No murmur heard. Pulmonary/Chest: Effort normal and breath sounds normal. No respiratory distress. He has no wheezes. He has no rales.  Abdominal: Soft. Bowel sounds are normal. He exhibits no distension. There is no tenderness.  Musculoskeletal: Normal range of motion. He exhibits no edema.  Neurological: He is alert and oriented to person, place, and time. Coordination normal.  Skin: Skin is warm and dry. He is not diaphoretic.  Nursing note and vitals reviewed.    ED Treatments / Results  Labs (all labs ordered are listed, but only abnormal results are displayed) Labs Reviewed - No data to display  EKG None  Radiology No results found.  Procedures Procedures (including critical care time)  Medications Ordered in ED Medications  famotidine (PEPCID) IVPB 20 mg premix (20 mg Intravenous New Bag/Given 12/26/17 0026)  methylPREDNISolone sodium succinate (SOLU-MEDROL) 125 mg/2 mL injection (has no administration in time  range)  diphenhydrAMINE (BENADRYL) 50 MG/ML injection (has no administration in time range)  famotidine (PEPCID) 20-0.9 MG/50ML-% IVPB (has no administration in time range)  methylPREDNISolone sodium succinate (SOLU-MEDROL) 125 mg/2 mL injection 80 mg (80 mg Intravenous Given 12/26/17 0026)  diphenhydrAMINE (BENADRYL) injection 25 mg (25 mg Intravenous Given 12/26/17 0026)     Initial Impression / Assessment and Plan / ED Course  I have reviewed the triage vital signs and the nursing notes.  Pertinent labs & imaging results that were available during my care of the patient were  reviewed by me and considered in my medical decision making (see chart for details).  Swelling has improved with medications given in the ER.  He has developed no airway issues and vitals have remained stable.  There is no sign for anaphylaxis.  He will be treated with prednisone, Benadryl, and follow-up as needed.  Final Clinical Impressions(s) / ED Diagnoses   Final diagnoses:  None    ED Discharge Orders    None       Geoffery Lyons, MD 12/26/17 [phone]

## 2018-01-15 ENCOUNTER — Encounter: Payer: Self-pay | Admitting: Physician Assistant

## 2018-01-15 ENCOUNTER — Ambulatory Visit: Payer: BLUE CROSS/BLUE SHIELD | Admitting: Physician Assistant

## 2018-01-15 VITALS — BP 116/82 | HR 64 | Ht 74.0 in | Wt 255.0 lb

## 2018-01-15 DIAGNOSIS — I255 Ischemic cardiomyopathy: Secondary | ICD-10-CM

## 2018-01-15 DIAGNOSIS — I2510 Atherosclerotic heart disease of native coronary artery without angina pectoris: Secondary | ICD-10-CM | POA: Diagnosis not present

## 2018-01-15 DIAGNOSIS — E785 Hyperlipidemia, unspecified: Secondary | ICD-10-CM | POA: Diagnosis not present

## 2018-01-15 MED ORDER — LISINOPRIL 2.5 MG PO TABS: 2.5000 mg | ORAL_TABLET | Freq: Every day | ORAL | 6 refills | Status: DC

## 2018-01-15 MED FILL — LISINOPRIL 2.5 MG TABLET: 2.5 | 30 days supply | Qty: 30 | Fill #0

## 2018-01-15 MED FILL — EPINEPHRINE 0.3 MG AUTO-INJ: 0.3 | 30 days supply | Qty: 2 | Fill #0

## 2018-01-15 NOTE — Progress Notes (Signed)
Cardiology Office Note    Date:  01/15/2018   ID:  [name], DOB 1955/10/13, MRN [id number]  PCP:  [name], MD  Cardiologist:  Dr. [name]   Chief Complaint  Patient presents with  . Follow-up    seen for Dr. [name].     History of Present Illness:  Martin Frank is a 62 y.o. male with PMH of pancreatitis, HLD, ICM and CAD. Based on previous record, he had an anterior MI complicated by ventricular tachycardia in May 2010. He had a drug-eluting stent placed in LAD. He apparently had nausea and vomiting and did not keep his Plavix down, he suffered stent thrombosis requiring a second intervention. He also had NSTEMI in July 2014 in [place]. Peak troponin was 2.62. Cath revealed patent LAD stent with mild to moderate nonobstructive disease otherwise. EF 35-40%. Echocardiogram obtained in May 2015 showed ejection fraction of 45-50%.  He apparently suffered another myocardial infarction in August 2016. Cardiac catheterization at [facility] showed moderate PDA disease, small ramus with 70-80% ostial lesion, patent LAD stent. There was normal LV function.   I last saw the patient in August 2018, he was lost to follow-up since 2016.  He was doing well at the time denying any significant chest pressure or shortness of breath.  EKG continued to show poor R wave progression in anterior leads.  Since the last time I saw him, he was admitted for chest pain on 12/05/2017.  Symptom was concerning for unstable angina.  Serial troponin negative.  Cardiac catheterization performed on the same day showed widely patent ostial to proximal LAD stent, mildly elevated LVEDP, EF 45 to 50% by LV gram.  Echocardiogram obtained on the following day also showed EF 45 to 50%, mild LVH.  Patient presents today for cardiology office visit.  He no longer has any chest pain.  He does not have any lower extremity edema, orthopnea or PND.  For some reason aspirin is not listed on our  medication list, however he has been taking the 81 mg aspirin daily.  Two weeks ago, he was stung by a bee in the forehead, and he says his entire face swelled up.  He is planning to ask his primary care provider for EpiPen.  From his cardiac perspective, he should be able to tolerate the increased heart rate of EpiPen on a as needed basis.  Although he does have a history of ventricular tachycardia, however this occurred in the setting of acute MI.  He can follow-up with Dr. [name] in 6 months.   Past Medical History:  Diagnosis Date  . Anterior myocardial infarction (HCC) 11/2008  . 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   . Chronic systolic heart failure (HCC)   . Depression   . Hx of cardiovascular stress test 2015   Lexiscan Myoview (11/2013):  Anteroseptal, apical anterior, apex, apical inferior scar; no ischemia, EF 50%; Intermediate Risk  . Hyperlipidemia   . Hypertension   . Ischemic cardiomyopathy    a.Echo (11/2013):  EF 45-50%, apical HK  . MI (myocardial infarction) (HCC) ? date MI #2; 2014 MI #3  . Obese   . Pancreatitis   . Type II diabetes mellitus (HCC)     Past Surgical History:  Procedure Laterality Date  . CARDIAC CATHETERIZATION  X2  . CORONARY ANGIOPLASTY WITH STENT PLACEMENT  5/2010X 2   "1+1" (12/05/2017)  . LAPAROSCOPIC CHOLECYSTECTOMY    . LEFT HEART CATH AND CORONARY ANGIOGRAPHY N/A 12/06/2017   Procedure: LEFT HEART CATH AND CORONARY ANGIOGRAPHY;  Surgeon: Runell Gess, MD;  Location: [facility];  Service: Cardiovascular;  Laterality: N/A;  . ORCHIECTOMY Right 1960s    Current Medications: Outpatient Medications Prior to Visit  Medication Sig Dispense Refill  . aspirin EC 81 MG tablet Take 81 mg by mouth daily.    . busPIRone (BUSPAR) 15 MG tablet Take  7.5 mg by mouth 2 (two) times daily.  1  . carvedilol (COREG) 6.25 MG tablet TAKE 1 TABLET BY MOUTH TWICE DAILY 60 tablet 0  . citalopram (CELEXA) 40 MG tablet Take 40 mg by mouth daily.     . famotidine (PEPCID) 20 MG tablet Take 20 mg by mouth at bedtime.     . nitroGLYCERIN (NITROSTAT) 0.4 MG SL tablet PLACE 1 TABLET (0.4 MG TOTAL) UNDER THE TONGUE EVERY 5 (FIVE) MINUTES AS NEEDED FOR CHEST PAIN (MAY REPEAT X 3 THAN CALL 911). 25 tablet 3  . rosuvastatin (CRESTOR) 40 MG tablet TAKE 1 TABLET BY MOUTH DAILY 30 tablet 0  . UNABLE TO FIND CBD oil: Use 1 dropperful sublingually once a day    . UNABLE TO FIND SuperBeets liquid: Drink 6 ounces by mouth once a day    . lisinopril (PRINIVIL,ZESTRIL) 2.5 MG tablet Take 1 tablet (2.5 mg total) by mouth daily. PLEASE CONTACT OFFICE FOR ADDITIONAL REFILLS (Patient taking differently: Take 2.5 mg by mouth daily. ) 30 tablet 0  . predniSONE (DELTASONE) 10 MG tablet Take 2 tablets (20 mg total) by mouth 2 (two) times daily. 12 tablet 0   No facility-administered medications prior to visit.      Allergies:   Patient has no known allergies.   Social History   Socioeconomic History  . Marital status: Married    Spouse name: Not on file  . Number of children: 2  . Years of education: Not on file  . Highest education level: Not on file  Occupational History  . Not on file  Social Needs  . Financial resource strain: Not on file  . Food insecurity:    Worry: Not on file    Inability: Not on file  . Transportation needs:    Medical: Not on file    Non-medical: Not on file  Tobacco Use  . Smoking status: Former Smoker    Packs/day: 1.50    Years: 10.00    Pack years: 15.00    Types: Cigarettes    Last attempt to quit: 1983    Years since quitting: 36.4  . Smokeless tobacco: Never Used  Substance and Sexual Activity  . Alcohol use: Yes    Alcohol/week: 1.8 oz    Types: 3 Glasses of wine per week  . Drug use: Not Currently    Comment:  12/05/2017 "nothing since my early 20's"  . Sexual activity: Yes  Lifestyle  . Physical activity:    Days per week: Not on file    Minutes per session: Not on file  . Stress: Not on file  Relationships  . Social connections:    Talks on phone: Not on file    Gets together: Not on file    Attends religious service: Not on file    Active member of club or  organization: Not on file    Attends meetings of clubs or organizations: Not on file    Relationship status: Not on file  Other Topics Concern  . Not on file  Social History Narrative  . Not on file     Family History:  The patient's family history includes CAD in his brother and father.   ROS:   Please see the history of present illness.    ROS All other systems reviewed and are negative.   PHYSICAL EXAM:   VS:  BP 116/82   Pulse 64   Ht 6\' 2"  (1.88 m)   Wt 255 lb (115.7 kg)   BMI 32.74 kg/m    GEN: Well nourished, well developed, in no acute distress  HEENT: normal  Neck: no JVD, carotid bruits, or masses Cardiac: RRR; no murmurs, rubs, or gallops,no edema  Respiratory:  clear to auscultation bilaterally, normal work of breathing GI: soft, nontender, nondistended, + BS MS: no deformity or atrophy  Skin: warm and dry, no rash Neuro:  Alert and Oriented x 3, Strength and sensation are intact Psych: euthymic mood, full affect  Wt Readings from Last 3 Encounters:  01/15/18 255 lb (115.7 kg)  12/26/17 253 lb (114.8 kg)  12/07/17 253 lb 12.8 oz (115.1 kg)      Studies/Labs Reviewed:   EKG:  EKG is not ordered today.    Recent Labs: 12/05/2017: TSH 1.810 12/07/2017: BUN 10; Creatinine, Ser 0.91; Hemoglobin 14.9; Platelets 174; Potassium 4.0; Sodium 141   Lipid Panel    Component Value Date/Time   CHOL 144 12/06/2017 0002   TRIG 110 12/06/2017 0002   HDL 42 12/06/2017 0002   CHOLHDL 3.4 12/06/2017 0002   VLDL 22 12/06/2017 0002   LDLCALC 80 12/06/2017 0002    Additional studies/ records that were reviewed  today include:   Cath 12/04/2017 Conclusion     Previously placed Ost LAD to Prox LAD stent (unknown type) is widely patent.  There is moderate left ventricular systolic dysfunction.  LV end diastolic pressure is mildly elevated.  The left ventricular ejection fraction is 45-50% by visual estimate.     Echo 12/07/2017 LV EF: 45% -   50%  ------------------------------------------------------------------- Indications:      Chest pain 786.51.  ------------------------------------------------------------------- History:   PMH:  Cardiomyopathy  Coronary artery disease.  Risk factors:  Hypertension. Diabetes mellitus. Dyslipidemia.  ------------------------------------------------------------------- Study Conclusions  - Left ventricle: The cavity size was normal. Wall thickness was   increased in a pattern of mild LVH. Systolic function was mildly   reduced. The estimated ejection fraction was in the range of 45%   to 50%. Mid to distal anterior, apical septal and apical   hypokinesis. The study is not technically sufficient to allow   evaluation of LV diastolic function. - Mitral valve: Mildly thickened leaflets . There was trivial   regurgitation. - Left atrium: The atrium was normal in size. - Right atrium: The atrium was at the upper limits of normal in   size. - Inferior vena cava: The vessel was dilated. The respirophasic   diameter changes were blunted (< 50%), consistent with elevated   central venous pressure.  Impressions:  - Compared to a prior study in 2015, there has been no significant   change.  ASSESSMENT:    1. Coronary artery disease involving native coronary artery of native heart without angina pectoris   2. Ischemic cardiomyopathy   3. Hyperlipidemia, unspecified hyperlipidemia type  PLAN:  In order of problems listed above:  1. CAD: Denies any chest pain or shortness of breath, continue aspirin and statin.  Recent cardiac  catheterization is reassuring.  2. Ischemic cardiomyopathy: Ejection fraction on recent echocardiogram continue to be 45 to 50%, this is unchanged compared to 2015 echo.  Continue carvedilol and lisinopril  3. Hyperlipidemia: Continue Crestor 40mg  daily, recent lab work showed very well-controlled her total cholesterol, triglyceride and HDL, LDL is borderline high at 80.  His LDL goal is less than 70.  Recommend diet and exercise.      Medication Adjustments/Labs and Tests Ordered: Current medicines are reviewed at length with the patient today.  Concerns regarding medicines are outlined above.  Medication changes, Labs and Tests ordered today are listed in the Patient Instructions below. Patient Instructions  Medication Instructions: Your physician recommends that you continue on your current medications as directed.    If you need a refill on your cardiac medications before your next appointment, please call your pharmacy.     Follow-Up: Your physician wants you to follow-up in 6 months with Dr. [name]. You will receive a reminder letter in the mail two months in advance. If you don't receive a letter, please call our office at [phone] to schedule this follow-up appointment.   Special Instructions:    Thank you for choosing [facility] at [facility], [place]  01/15/2018 9:19 AM    [facility] [address] [place], [place], [place]  [address] Phone: [phone]; Fax: [phone]

## 2018-01-15 NOTE — Patient Instructions (Signed)
Medication Instructions: Your physician recommends that you continue on your current medications as directed.    If you need a refill on your cardiac medications before your next appointment, please call your pharmacy.     Follow-Up: Your physician wants you to follow-up in 6 months with Dr. [name]. You will receive a reminder letter in the mail two months in advance. If you don't receive a letter, please call our office at [phone] to schedule this follow-up appointment.   Special Instructions:    Thank you for choosing [facility] at [facility]!!

## 2018-01-21 ENCOUNTER — Other Ambulatory Visit: Payer: Self-pay | Admitting: Cardiology

## 2018-01-21 MED FILL — CLOPIDOGREL 75 MG TABLET: 75 | 90 days supply | Qty: 90 | Fill #0

## 2018-01-21 NOTE — Telephone Encounter (Signed)
Rx request sent to pharmacy.  

## 2018-02-06 ENCOUNTER — Other Ambulatory Visit: Payer: Self-pay | Admitting: Cardiology

## 2018-02-06 MED FILL — ROSUVASTATIN CALCIUM 40 MG: 40 | 30 days supply | Qty: 30 | Fill #0

## 2018-02-06 MED FILL — CARVEDILOL 6.25 MG TABLET: 6.25 | 30 days supply | Qty: 60 | Fill #0

## 2018-03-04 MED FILL — LISINOPRIL 2.5 MG TABLET: 2.5 | 30 days supply | Qty: 30 | Fill #1

## 2018-03-08 ENCOUNTER — Ambulatory Visit: Payer: BLUE CROSS/BLUE SHIELD | Admitting: Cardiology

## 2018-04-02 MED FILL — metFORMIN HCL 500 MG TABS: 500 | 90 days supply | Qty: 180 | Fill #1

## 2018-04-02 MED FILL — CARVEDILOL 6.25 MG TABLET: 6.25 | 30 days supply | Qty: 60 | Fill #1

## 2018-04-02 MED FILL — CITALOPRAM HBR 40 MG TABLET: 40 | 90 days supply | Qty: 90 | Fill #1

## 2018-04-02 MED FILL — ROSUVASTATIN CALCIUM 40 MG: 40 | 30 days supply | Qty: 30 | Fill #1

## 2018-04-23 MED FILL — LISINOPRIL 2.5 MG TABLET: 2.5 | 30 days supply | Qty: 30 | Fill #2

## 2018-05-20 MED FILL — ROSUVASTATIN CALCIUM 40 MG: 40 | 30 days supply | Qty: 30 | Fill #2

## 2018-05-20 MED FILL — CARVEDILOL 6.25 MG TABLET: 6.25 | 30 days supply | Qty: 60 | Fill #2

## 2018-06-11 MED FILL — LISINOPRIL 2.5 MG TABLET: 2.5 | 30 days supply | Qty: 30 | Fill #3

## 2018-06-11 MED FILL — CLOPIDOGREL 75 MG TABLET: 75 | 90 days supply | Qty: 90 | Fill #1

## 2018-07-04 MED FILL — CARVEDILOL 6.25 MG TABLET: 6.25 | 30 days supply | Qty: 60 | Fill #3

## 2018-07-17 MED FILL — ROSUVASTATIN CALCIUM 40 MG: 40 | 30 days supply | Qty: 30 | Fill #3

## 2018-08-05 MED FILL — busPIRone HCL 15 MG TABS: 15 | 90 days supply | Qty: 180 | Fill #1

## 2018-08-05 MED FILL — CARVEDILOL 6.25 MG TABLET: 6.25 | 30 days supply | Qty: 60 | Fill #4

## 2018-08-07 MED FILL — metFORMIN HCL 500 MG TABS: 500 | 90 days supply | Qty: 180 | Fill #0

## 2018-08-08 MED FILL — CITALOPRAM HBR 40 MG TABLET: 40 | 60 days supply | Qty: 60 | Fill #0

## 2018-08-22 MED FILL — ROSUVASTATIN CALCIUM 40 MG: 40 | 30 days supply | Qty: 30 | Fill #4

## 2018-09-02 MED FILL — LISINOPRIL 2.5 MG TABLET: 2.5 | 30 days supply | Qty: 30 | Fill #5

## 2018-09-17 MED FILL — CARVEDILOL 6.25 MG TABLET: 6.25 | 30 days supply | Qty: 60 | Fill #5

## 2018-09-30 MED FILL — CITALOPRAM HBR 40 MG TABLET: 40 | 90 days supply | Qty: 90 | Fill #0

## 2018-10-07 MED FILL — LISINOPRIL 2.5 MG TABLET: 2.5 | 30 days supply | Qty: 30 | Fill #6

## 2018-10-07 MED FILL — ROSUVASTATIN CALCIUM 40 MG: 40 | 30 days supply | Qty: 30 | Fill #5

## 2018-10-07 MED FILL — CLOPIDOGREL 75 MG TABLET: 75 | 90 days supply | Qty: 90 | Fill #2

## 2018-11-22 MED FILL — LISINOPRIL 2.5 MG TABLET: 2.5 | 90 days supply | Qty: 90 | Fill #0

## 2018-11-22 MED FILL — CARVEDILOL 6.25 MG TABLET: 6.25 | 90 days supply | Qty: 180 | Fill #0

## 2018-11-22 MED FILL — busPIRone HCL 15 MG TABS: 15 | 90 days supply | Qty: 180 | Fill #0

## 2018-11-22 MED FILL — ROSUVASTATIN CALCIUM 40 MG: 40 | 90 days supply | Qty: 90 | Fill #0

## 2018-11-22 MED FILL — metFORMIN HCL 500 MG TABS: 500 | 90 days supply | Qty: 180 | Fill #0

## 2019-01-13 MED FILL — CITALOPRAM HBR 40 MG TABLET: 40 | 90 days supply | Qty: 90 | Fill #1

## 2019-02-28 MED FILL — busPIRone HCL 15 MG TABS: 15 | 90 days supply | Qty: 180 | Fill #1

## 2019-02-28 MED FILL — LISINOPRIL 2.5 MG TABLET: 2.5 | 90 days supply | Qty: 90 | Fill #1

## 2019-02-28 MED FILL — metFORMIN HCL 500 MG TABS: 500 | 90 days supply | Qty: 180 | Fill #1

## 2019-02-28 MED FILL — CARVEDILOL 6.25 MG TABLET: 6.25 | 90 days supply | Qty: 180 | Fill #1

## 2019-02-28 MED FILL — ROSUVASTATIN CALCIUM 40 MG: 40 | 90 days supply | Qty: 90 | Fill #1

## 2019-04-02 MED FILL — CITALOPRAM HBR 40 MG TABLET: 40 | 90 days supply | Qty: 90 | Fill #0

## 2019-05-20 DIAGNOSIS — I252 Old myocardial infarction: Secondary | ICD-10-CM

## 2019-05-20 DIAGNOSIS — Z955 Presence of coronary angioplasty implant and graft: Secondary | ICD-10-CM

## 2019-05-20 HISTORY — DX: Presence of coronary angioplasty implant and graft: Z95.5

## 2019-05-20 HISTORY — DX: Old myocardial infarction: I25.2

## 2019-06-18 MED FILL — LISINOPRIL 2.5 MG TABLET: 2.5 | 90 days supply | Qty: 90 | Fill #0

## 2019-06-18 MED FILL — metFORMIN HCL 500 MG TABS: 500 | 90 days supply | Qty: 180 | Fill #0

## 2019-06-18 MED FILL — CARVEDILOL 6.25 MG TABLET: 6.25 | 90 days supply | Qty: 180 | Fill #0

## 2019-06-18 MED FILL — ROSUVASTATIN CALCIUM 40 MG: 40 | 90 days supply | Qty: 90 | Fill #0

## 2019-08-08 IMAGING — CR DG CHEST 2V
2 series · 2 of 2 positions shown · non-contrast
Comparison: 03/30/2015

CLINICAL DATA: Intermittent chest pain centrally and radiating to
back beginning today. Four previous heart attacks.

EXAM:
CHEST - 2 VIEW

[chest lat]
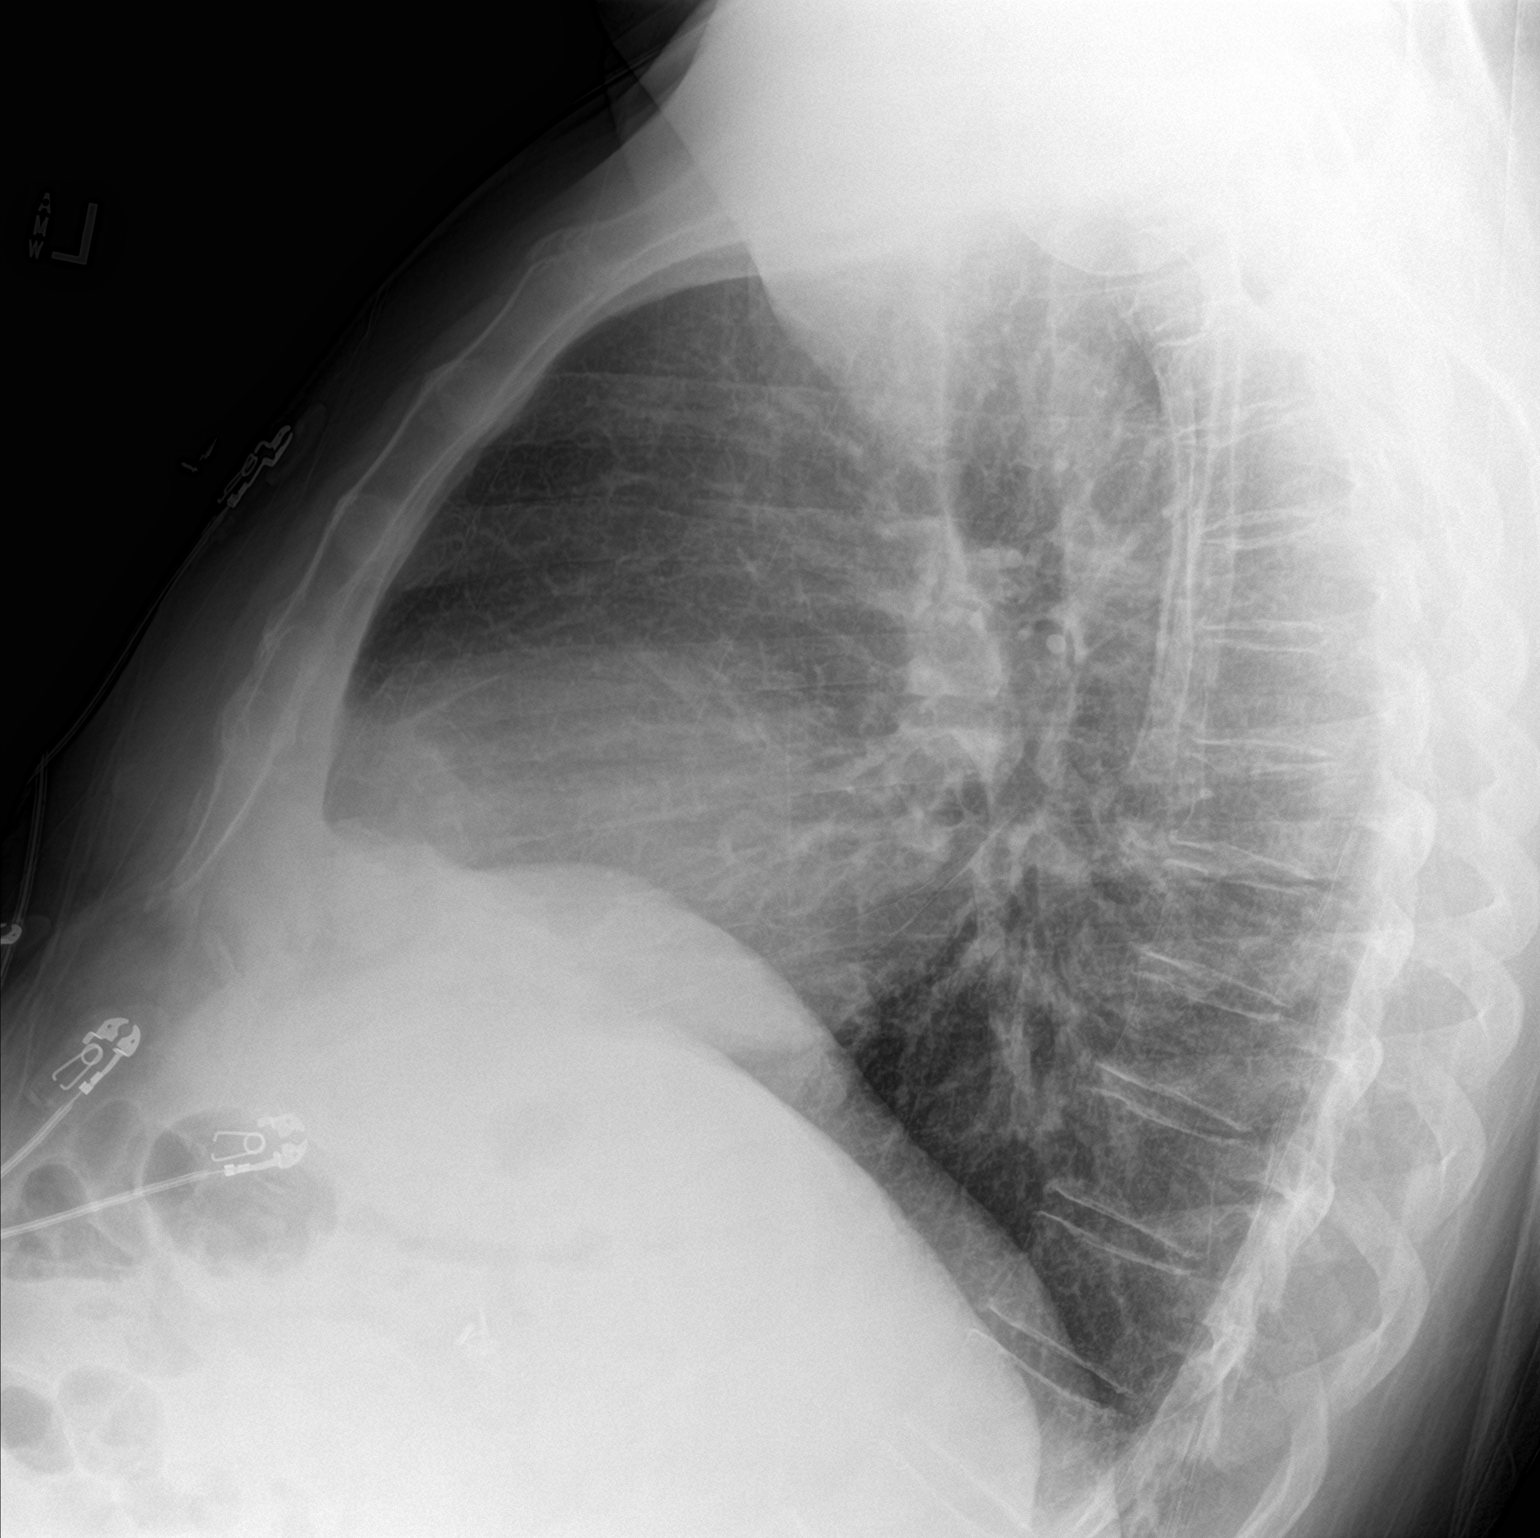

[chest ap]
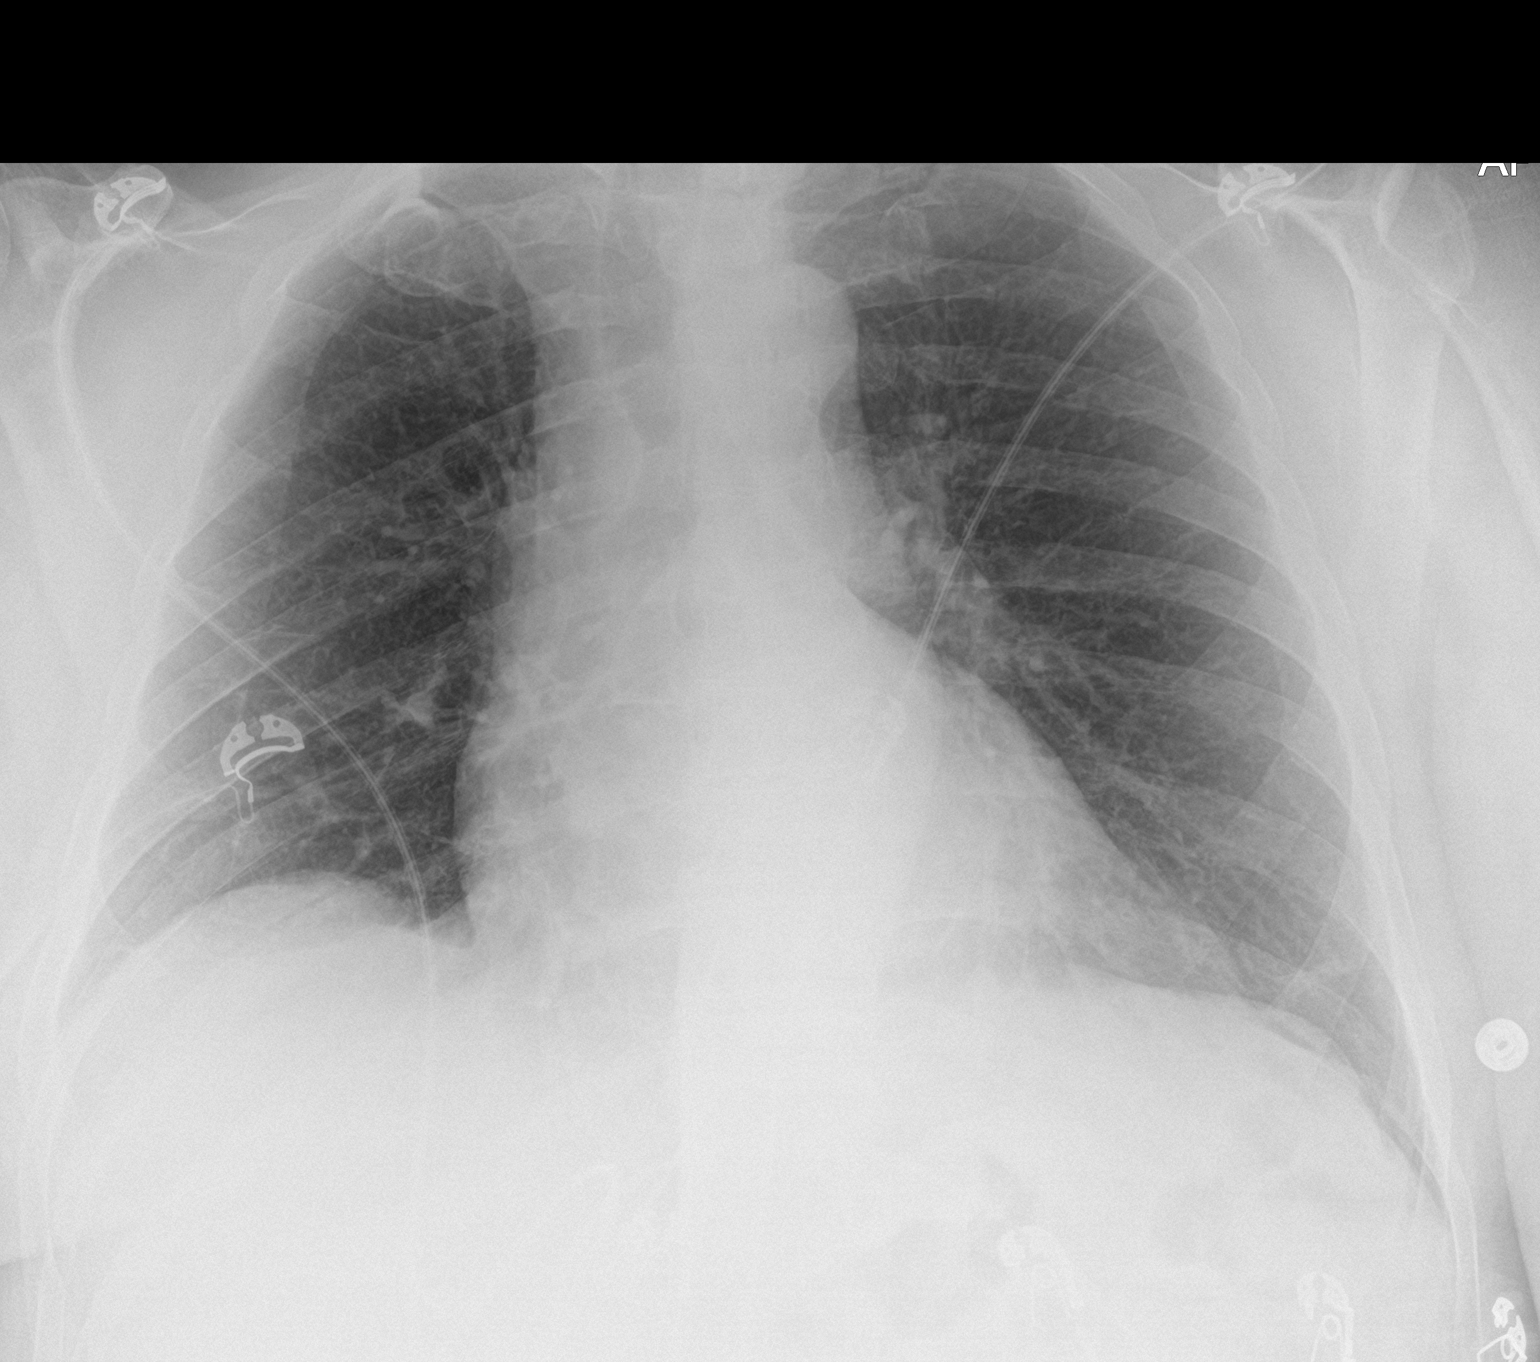

[2 of 2 positions shown; findings below may reference images not displayed]

FINDINGS: Lungs are adequately inflated and otherwise clear. Mild stable
cardiomegaly. Remainder the exam is unchanged.
IMPRESSION: No acute cardiopulmonary disease.

## 2019-08-11 MED FILL — CITALOPRAM HBR 40 MG TABLET: 40 | 90 days supply | Qty: 90 | Fill #1

## 2019-09-11 MED FILL — CARVEDILOL 6.25 MG TABLET: 6.25 | 90 days supply | Qty: 180 | Fill #1

## 2019-09-11 MED FILL — metFORMIN HCL 500 MG TABS: 500 | 90 days supply | Qty: 180 | Fill #1

## 2019-09-11 MED FILL — ROSUVASTATIN CALCIUM 40 MG: 40 | 90 days supply | Qty: 90 | Fill #1

## 2019-09-11 MED FILL — LISINOPRIL 2.5 MG TABLET: 2.5 | 90 days supply | Qty: 90 | Fill #1

## 2019-11-12 MED FILL — CITALOPRAM HBR 40 MG TABLET: 40 | 90 days supply | Qty: 90 | Fill #2

## 2020-01-26 MED FILL — METFORMIN HCL 500 MG TABS: 500 | 90 days supply | Qty: 180 | Fill #2

## 2020-01-26 MED FILL — ROSUVASTATIN CALCIUM 40 MG: 40 | 90 days supply | Qty: 90 | Fill #2

## 2020-01-26 MED FILL — CARVEDILOL 6.25 MG TABLET: 6.25 | 90 days supply | Qty: 180 | Fill #2

## 2020-01-26 MED FILL — LISINOPRIL 2.5 MG TABLET: 2.5 | 90 days supply | Qty: 90 | Fill #2

## 2020-03-02 MED FILL — CITALOPRAM HBR 40 MG TABLET: 40 | 90 days supply | Qty: 90 | Fill #3

## 2020-06-10 ENCOUNTER — Encounter: Payer: Self-pay | Admitting: General Practice

## 2020-06-10 ENCOUNTER — Telehealth: Payer: Self-pay | Admitting: Cardiology

## 2020-06-10 NOTE — Telephone Encounter (Signed)
  Recall expunge letter sent 

## 2021-05-09 DIAGNOSIS — I214 Non-ST elevation (NSTEMI) myocardial infarction: Secondary | ICD-10-CM | POA: Insufficient documentation

## 2021-11-09 DIAGNOSIS — Z760 Encounter for issue of repeat prescription: Secondary | ICD-10-CM | POA: Diagnosis not present

## 2021-11-09 DIAGNOSIS — F419 Anxiety disorder, unspecified: Secondary | ICD-10-CM | POA: Diagnosis not present

## 2021-11-09 DIAGNOSIS — F32A Depression, unspecified: Secondary | ICD-10-CM | POA: Diagnosis not present

## 2021-12-13 ENCOUNTER — Encounter: Payer: Self-pay | Admitting: Family Medicine

## 2021-12-13 ENCOUNTER — Ambulatory Visit (INDEPENDENT_AMBULATORY_CARE_PROVIDER_SITE_OTHER): Payer: Medicare Other | Admitting: Family Medicine

## 2021-12-13 VITALS — BP 112/84 | HR 75 | Temp 97.0°F | Ht 75.75 in | Wt 239.0 lb

## 2021-12-13 DIAGNOSIS — K219 Gastro-esophageal reflux disease without esophagitis: Secondary | ICD-10-CM

## 2021-12-13 DIAGNOSIS — F331 Major depressive disorder, recurrent, moderate: Secondary | ICD-10-CM | POA: Diagnosis not present

## 2021-12-13 DIAGNOSIS — I2583 Coronary atherosclerosis due to lipid rich plaque: Secondary | ICD-10-CM

## 2021-12-13 DIAGNOSIS — I2510 Atherosclerotic heart disease of native coronary artery without angina pectoris: Secondary | ICD-10-CM

## 2021-12-13 DIAGNOSIS — I208 Other forms of angina pectoris: Secondary | ICD-10-CM | POA: Diagnosis not present

## 2021-12-13 DIAGNOSIS — E1169 Type 2 diabetes mellitus with other specified complication: Secondary | ICD-10-CM

## 2021-12-13 DIAGNOSIS — E669 Obesity, unspecified: Secondary | ICD-10-CM | POA: Diagnosis not present

## 2021-12-13 DIAGNOSIS — I5022 Chronic systolic (congestive) heart failure: Secondary | ICD-10-CM | POA: Diagnosis not present

## 2021-12-13 DIAGNOSIS — E785 Hyperlipidemia, unspecified: Secondary | ICD-10-CM

## 2021-12-13 DIAGNOSIS — I10 Essential (primary) hypertension: Secondary | ICD-10-CM | POA: Diagnosis not present

## 2021-12-13 MED ORDER — LISINOPRIL 2.5 MG PO TABS: 2.5000 mg | ORAL_TABLET | Freq: Every day | ORAL | 0 refills | Status: DC

## 2021-12-13 MED ORDER — ROSUVASTATIN CALCIUM 40 MG PO TABS: 40.0000 mg | ORAL_TABLET | Freq: Every day | ORAL | 6 refills | Status: DC

## 2021-12-13 MED ORDER — CARVEDILOL 6.25 MG PO TABS: 6.2500 mg | ORAL_TABLET | Freq: Two times a day (BID) | ORAL | 6 refills | Status: DC

## 2021-12-13 MED ORDER — BUSPIRONE HCL 15 MG PO TABS: 7.5000 mg | ORAL_TABLET | Freq: Two times a day (BID) | ORAL | 0 refills | Status: AC

## 2021-12-13 MED ORDER — ASPIRIN EC 81 MG PO TBEC: 81.0000 mg | DELAYED_RELEASE_TABLET | Freq: Every day | ORAL | 0 refills | Status: AC

## 2021-12-13 MED ORDER — PANTOPRAZOLE SODIUM 40 MG PO TBEC: 40.0000 mg | DELAYED_RELEASE_TABLET | Freq: Every day | ORAL | 0 refills | Status: DC

## 2021-12-13 MED ORDER — CITALOPRAM HYDROBROMIDE 40 MG PO TABS: 40.0000 mg | ORAL_TABLET | Freq: Every day | ORAL | 2 refills | Status: DC

## 2021-12-13 NOTE — Assessment & Plan Note (Signed)
Controlled on Protonix

## 2021-12-13 NOTE — Assessment & Plan Note (Signed)
Occasionally takes nitro, but for the most part doing well ?

## 2021-12-13 NOTE — Patient Instructions (Signed)
He was pleasure meeting today. ?Please follow-up in 4 weeks to discuss labs and depression. ?We have refilled your medications as discussed. ?If you feel the need or have issues with suicidal correction, call 911 or go to the nearest hospital ?

## 2021-12-13 NOTE — Assessment & Plan Note (Signed)
HFpEF ? ?On lisinopril, carvedilol ?Follows with cardiology ?

## 2021-12-13 NOTE — Assessment & Plan Note (Signed)
On metformin, does not know the dose ?Patient tell me does in 1 month ?Recheck hemoglobin A1c in 1 month ?On ACE inhibitor and statin ?

## 2021-12-13 NOTE — Assessment & Plan Note (Addendum)
Follows with cardiology ?Status post stenting ?On aspirin daily, carvedilol, rosuvastatin ?Follow-up 1 month for labs ?Medical medications refilled as ordered ?

## 2021-12-13 NOTE — Assessment & Plan Note (Signed)
On rosuvastatin ?Refill ?

## 2021-12-13 NOTE — Assessment & Plan Note (Signed)
Elevated PHQ-9 ?Elevated GAD-7 ?No HI/SI, although does have history of the ?On citalopram 40 mg daily, BuSpar 7.5 mg twice daily ?Follow-up 1 month ?

## 2021-12-13 NOTE — Assessment & Plan Note (Signed)
Stable
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
## 2022-01-10 ENCOUNTER — Ambulatory Visit (INDEPENDENT_AMBULATORY_CARE_PROVIDER_SITE_OTHER): Payer: Medicare Other | Admitting: Family Medicine

## 2022-01-10 ENCOUNTER — Encounter: Payer: Self-pay | Admitting: Family Medicine

## 2022-01-10 ENCOUNTER — Telehealth: Payer: Self-pay

## 2022-01-10 VITALS — BP 130/84 | HR 76 | Temp 97.0°F | Wt 232.0 lb

## 2022-01-10 DIAGNOSIS — I2583 Coronary atherosclerosis due to lipid rich plaque: Secondary | ICD-10-CM

## 2022-01-10 DIAGNOSIS — E785 Hyperlipidemia, unspecified: Secondary | ICD-10-CM | POA: Diagnosis not present

## 2022-01-10 DIAGNOSIS — E1169 Type 2 diabetes mellitus with other specified complication: Secondary | ICD-10-CM

## 2022-01-10 DIAGNOSIS — G63 Polyneuropathy in diseases classified elsewhere: Secondary | ICD-10-CM | POA: Diagnosis not present

## 2022-01-10 DIAGNOSIS — E871 Hypo-osmolality and hyponatremia: Secondary | ICD-10-CM | POA: Diagnosis not present

## 2022-01-10 DIAGNOSIS — I2510 Atherosclerotic heart disease of native coronary artery without angina pectoris: Secondary | ICD-10-CM | POA: Diagnosis not present

## 2022-01-10 DIAGNOSIS — I10 Essential (primary) hypertension: Secondary | ICD-10-CM | POA: Diagnosis not present

## 2022-01-10 DIAGNOSIS — I5022 Chronic systolic (congestive) heart failure: Secondary | ICD-10-CM | POA: Diagnosis not present

## 2022-01-10 DIAGNOSIS — D696 Thrombocytopenia, unspecified: Secondary | ICD-10-CM

## 2022-01-10 LAB — COMPREHENSIVE METABOLIC PANEL
ALT: 32 U/L (ref 0–53)
AST: 38 U/L — ABNORMAL HIGH (ref 0–37)
Albumin: 4 g/dL (ref 3.5–5.2)
Alkaline Phosphatase: 88 U/L (ref 39–117)
BUN: 14 mg/dL (ref 6–23)
CO2: 23 mEq/L (ref 19–32)
Calcium: 9.4 mg/dL (ref 8.4–10.5)
Chloride: 103 mEq/L (ref 96–112)
Creatinine, Ser: 0.88 mg/dL (ref 0.40–1.50)
GFR: 90.47 mL/min (ref >60.00)
Glucose, Bld: 129 mg/dL — ABNORMAL HIGH (ref 70–99)
Potassium: 4.1 mEq/L (ref 3.5–5.1)
Sodium: 134 mEq/L — ABNORMAL LOW (ref 135–145)
Total Bilirubin: 0.8 mg/dL (ref 0.2–1.2)
Total Protein: 6.8 g/dL (ref 6.0–8.3)

## 2022-01-10 LAB — LIPID PANEL
Cholesterol: 136 mg/dL (ref 0–200)
HDL: 50.4 mg/dL (ref >39.00)
LDL Cholesterol: 63 mg/dL (ref 0–99)
NonHDL: 86
Total CHOL/HDL Ratio: 3
Triglycerides: 116 mg/dL (ref 0.0–149.0)
VLDL: 23.2 mg/dL (ref 0.0–40.0)

## 2022-01-10 LAB — CBC WITH DIFFERENTIAL/PLATELET
Basophils Absolute: 0.1 10*3/uL (ref 0.0–0.1)
Basophils Relative: 1.9 % (ref 0.0–3.0)
Eosinophils Absolute: 0.2 10*3/uL (ref 0.0–0.7)
Eosinophils Relative: 4 % (ref 0.0–5.0)
HCT: 42.2 % (ref 39.0–52.0)
Hemoglobin: 14.7 g/dL (ref 13.0–17.0)
Lymphocytes Relative: 33.7 % (ref 12.0–46.0)
Lymphs Abs: 2 10*3/uL (ref 0.7–4.0)
MCHC: 34.8 g/dL (ref 30.0–36.0)
MCV: 91.5 fl (ref 78.0–100.0)
Monocytes Absolute: 0.4 10*3/uL (ref 0.1–1.0)
Monocytes Relative: 6.9 % (ref 3.0–12.0)
Neutro Abs: 3.1 10*3/uL (ref 1.4–7.7)
Neutrophils Relative %: 53.5 % (ref 43.0–77.0)
Platelets: 139 10*3/uL — ABNORMAL LOW (ref 150.0–400.0)
RBC: 4.61 Mil/uL (ref 4.22–5.81)
RDW: 12.8 % (ref 11.5–15.5)
WBC: 5.8 10*3/uL (ref 4.0–10.5)

## 2022-01-10 LAB — B12 AND FOLATE PANEL
Folate: 16.1 ng/mL (ref >5.9)
Vitamin B-12: 460 pg/mL (ref 211–911)

## 2022-01-10 LAB — HEMOGLOBIN A1C: Hgb A1c MFr Bld: 6.2 % (ref 4.6–6.5)

## 2022-01-10 NOTE — Assessment & Plan Note (Addendum)
Patient repeat hemoglobin A1c Diabetic foot exam strict peripheral neuropathy Patient on metformin, however does not know dose  Denies polyuria polydipsia Check hemoglobin A1c

## 2022-01-10 NOTE — Patient Instructions (Signed)
We are checking basic labs including hemoglobin A1c, lipid panel, renal function, B12 and blood counts We will call with these results

## 2022-01-10 NOTE — Assessment & Plan Note (Signed)
Blood pressure at goal Continue lisinopril 2.5 mg and carvedilol 6.25 mg twice daily Recheck CMP

## 2022-01-10 NOTE — Telephone Encounter (Signed)
Tried to call pharmacy, but they are closed for lunch. Will try again later.

## 2022-01-10 NOTE — Telephone Encounter (Signed)
-----   Message from Bonnita Hollow, MD sent at 01/10/2022 12:01 PM EDT ----- Please call pharmacy and find out dosing of metformin

## 2022-01-10 NOTE — Progress Notes (Signed)
   Martin Frank is a 66 y.o. male who presents today for an office visit.  Assessment/Plan:  New/Acute Problems: None  Chronic Problems Addressed Today: Type 2 diabetes mellitus with other specified complication [facility]) Patient repeat hemoglobin A1c Diabetic foot exam strict peripheral neuropathy Patient on metformin, however does not know dose  Denies polyuria polydipsia Check hemoglobin A1c  Polyneuropathy associated with underlying disease (HCC) Likely associated with diabetes On chronic metformin We will check B12 as well as hemoglobin A1c  Hyperlipidemia On rosuvastatin 40 mg Recheck lipid panel  Essential hypertension Blood pressure at goal Continue lisinopril 2.5 mg and carvedilol 6.25 mg twice daily Recheck CMP     Subjective:  HPI:  Martin Frank is a 66 y.o. male with has CAD (coronary artery disease); Hyperlipidemia; Other forms of angina pectoris (HCC); Chronic systolic (congestive) heart failure (HCC); Type 2 diabetes mellitus with other specified complication (HCC); Essential hypertension; ED (erectile dysfunction) of organic origin; MDD (major depressive disorder), recurrent episode, moderate (HCC); Gastroesophageal reflux disease; and Polyneuropathy associated with underlying disease (HCC) on their problem list. And          Objective:  Physical Exam: BP 130/84 (BP Location: Left Arm, Patient Position: Sitting, Cuff Size: Large)   Pulse 76   Temp (!) 97 F (36.1 C) (Temporal)   Wt 232 lb (105.2 kg)   SpO2 97%   BMI 28.43 kg/m   Gen: No acute distress, resting comfortably CV: Regular rate and rhythm with no murmurs appreciated Pulm: Normal work of breathing, clear to auscultation bilaterally with no crackles, wheezes, or rhonchi Neuro: Grossly normal, moves all extremities Psych: Normal affect and thought content  Diabetic Foot Exam - Simple   Simple Foot Form Diabetic Foot exam was performed with the following findings: Yes 01/10/2022  8:26  AM  Visual Inspection No deformities, no ulcerations, no other skin breakdown bilaterally: Yes Sensation Testing See comments: Yes Pulse Check Posterior Tibialis and Dorsalis pulse intact bilaterally: Yes Comments Loss of sensation in plantar surface great toe bilaterally          Garner Nash, MD, MS

## 2022-01-10 NOTE — Telephone Encounter (Signed)
Spoke with pharmacy and they confirmed patient was taking Metformin 500 mg 2 times by mouth daily with meals.

## 2022-01-10 NOTE — Telephone Encounter (Signed)
-----   Message from Aaron B Thompson, MD sent at 01/10/2022 12:01 PM EDT ----- Please call pharmacy and find out dosing of metformin  

## 2022-01-10 NOTE — Assessment & Plan Note (Signed)
Likely associated with diabetes On chronic metformin We will check B12 as well as hemoglobin A1c

## 2022-01-10 NOTE — Assessment & Plan Note (Signed)
On rosuvastatin 40 mg Recheck lipid panel

## 2022-01-20 DIAGNOSIS — L03116 Cellulitis of left lower limb: Secondary | ICD-10-CM | POA: Diagnosis not present

## 2022-01-24 ENCOUNTER — Telehealth: Payer: Self-pay | Admitting: Family Medicine

## 2022-01-24 MED ORDER — METFORMIN HCL 500 MG PO TABS: 500.0000 mg | ORAL_TABLET | Freq: Two times a day (BID) | ORAL | 1 refills | Status: DC

## 2022-01-24 NOTE — Telephone Encounter (Signed)
Chart supports rx. Last OV: 01/10/2022 Next OV: 02/13/2022

## 2022-01-27 ENCOUNTER — Ambulatory Visit
Admission: EM | Admit: 2022-01-27 | Discharge: 2022-01-27 | Disposition: A | Discharge status: Home / Self Care | Payer: Medicare Other | Attending: Family Medicine | Admitting: Family Medicine

## 2022-01-27 DIAGNOSIS — E11628 Type 2 diabetes mellitus with other skin complications: Secondary | ICD-10-CM

## 2022-01-27 DIAGNOSIS — L089 Local infection of the skin and subcutaneous tissue, unspecified: Secondary | ICD-10-CM | POA: Diagnosis not present

## 2022-01-27 DIAGNOSIS — T25021A Burn of unspecified degree of right foot, initial encounter: Secondary | ICD-10-CM

## 2022-01-27 DIAGNOSIS — T300 Burn of unspecified body region, unspecified degree: Secondary | ICD-10-CM

## 2022-01-27 MED ORDER — SILVER SULFADIAZINE 1 % EX CREA: 1.0000 | TOPICAL_CREAM | Freq: Two times a day (BID) | CUTANEOUS | 0 refills | Status: DC

## 2022-01-27 MED ORDER — DOXYCYCLINE HYCLATE 100 MG PO CAPS
100.0000 mg | ORAL_CAPSULE | Freq: Two times a day (BID) | ORAL | 0 refills | Status: DC
Start: 2022-01-29 — End: 2022-05-15

## 2022-01-27 NOTE — Discharge Instructions (Addendum)
Complete current antibiotics.  Start doxycycline on Sunday.  Change the dressing to your foot at least twice daily until the wound on your foot have completely closed or healed.  If at any point the swelling, redness or pain in your foot worsens I would like for you to return for evaluation or follow-up with your primary care doctor.
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
## 2022-02-13 ENCOUNTER — Ambulatory Visit (INDEPENDENT_AMBULATORY_CARE_PROVIDER_SITE_OTHER): Payer: Medicare Other | Admitting: Family Medicine

## 2022-02-13 ENCOUNTER — Encounter: Payer: Self-pay | Admitting: Family Medicine

## 2022-02-13 VITALS — BP 122/82 | HR 70 | Temp 97.4°F | Wt 228.6 lb

## 2022-02-13 DIAGNOSIS — E871 Hypo-osmolality and hyponatremia: Secondary | ICD-10-CM | POA: Diagnosis not present

## 2022-02-13 DIAGNOSIS — E1169 Type 2 diabetes mellitus with other specified complication: Secondary | ICD-10-CM | POA: Diagnosis not present

## 2022-02-13 DIAGNOSIS — D696 Thrombocytopenia, unspecified: Secondary | ICD-10-CM | POA: Diagnosis not present

## 2022-02-13 LAB — COMPREHENSIVE METABOLIC PANEL
ALT: 55 U/L — ABNORMAL HIGH (ref 0–53)
AST: 54 U/L — ABNORMAL HIGH (ref 0–37)
Albumin: 4.2 g/dL (ref 3.5–5.2)
Alkaline Phosphatase: 122 U/L — ABNORMAL HIGH (ref 39–117)
BUN: 13 mg/dL (ref 6–23)
CO2: 26 mEq/L (ref 19–32)
Calcium: 9.5 mg/dL (ref 8.4–10.5)
Chloride: 103 mEq/L (ref 96–112)
Creatinine, Ser: 0.85 mg/dL (ref 0.40–1.50)
GFR: 91.36 mL/min (ref >60.00)
Glucose, Bld: 130 mg/dL — ABNORMAL HIGH (ref 70–99)
Potassium: 4.4 mEq/L (ref 3.5–5.1)
Sodium: 137 mEq/L (ref 135–145)
Total Bilirubin: 0.8 mg/dL (ref 0.2–1.2)
Total Protein: 6.9 g/dL (ref 6.0–8.3)

## 2022-02-13 LAB — CBC WITH DIFFERENTIAL/PLATELET
Basophils Absolute: 0.1 10*3/uL (ref 0.0–0.1)
Basophils Relative: 2.5 % (ref 0.0–3.0)
Eosinophils Absolute: 0.1 10*3/uL (ref 0.0–0.7)
Eosinophils Relative: 2.7 % (ref 0.0–5.0)
HCT: 44.5 % (ref 39.0–52.0)
Hemoglobin: 15.1 g/dL (ref 13.0–17.0)
Lymphocytes Relative: 32.4 % (ref 12.0–46.0)
Lymphs Abs: 1.7 10*3/uL (ref 0.7–4.0)
MCHC: 33.9 g/dL (ref 30.0–36.0)
MCV: 93.3 fl (ref 78.0–100.0)
Monocytes Absolute: 0.4 10*3/uL (ref 0.1–1.0)
Monocytes Relative: 8.1 % (ref 3.0–12.0)
Neutro Abs: 2.9 10*3/uL (ref 1.4–7.7)
Neutrophils Relative %: 54.3 % (ref 43.0–77.0)
Platelets: 154 10*3/uL (ref 150.0–400.0)
RBC: 4.77 Mil/uL (ref 4.22–5.81)
RDW: 13.4 % (ref 11.5–15.5)
WBC: 5.3 10*3/uL (ref 4.0–10.5)

## 2022-02-13 LAB — MICROALBUMIN / CREATININE URINE RATIO
Creatinine,U: 111.2 mg/dL
Microalb Creat Ratio: 0.9 mg/g (ref 0.0–30.0)
Microalb, Ur: 1 mg/dL (ref 0.0–1.9)

## 2022-02-13 NOTE — Assessment & Plan Note (Signed)
Check urine microalbumin. ?

## 2022-02-13 NOTE — Patient Instructions (Signed)
We are rechecking your electrolytes and blood counts today.

## 2022-02-13 NOTE — Assessment & Plan Note (Signed)
Mild, found on labs Recheck CBC
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
## 2022-02-13 NOTE — Assessment & Plan Note (Signed)
Asymptomatic Recheck CMP

## 2022-03-30 ENCOUNTER — Telehealth: Payer: Self-pay | Admitting: Family Medicine

## 2022-03-30 DIAGNOSIS — K219 Gastro-esophageal reflux disease without esophagitis: Secondary | ICD-10-CM

## 2022-03-30 DIAGNOSIS — F331 Major depressive disorder, recurrent, moderate: Secondary | ICD-10-CM

## 2022-03-30 NOTE — Telephone Encounter (Signed)
Caller Name: Kolbey Teichert Call back phone #: [phone]  Reason for Call: Pt dropped off paperwork in the office. He wanted to make sure his medication list was corrected and a couple refills were sent in. Put in Dr. Carollee Massed folder up front

## 2022-03-31 ENCOUNTER — Other Ambulatory Visit: Payer: Self-pay | Admitting: Family Medicine

## 2022-03-31 DIAGNOSIS — F331 Major depressive disorder, recurrent, moderate: Secondary | ICD-10-CM

## 2022-03-31 MED ORDER — PANTOPRAZOLE SODIUM 40 MG PO TBEC: 40.0000 mg | DELAYED_RELEASE_TABLET | Freq: Every day | ORAL | 0 refills | Status: DC

## 2022-03-31 MED ORDER — BUSPIRONE HCL 7.5 MG PO TABS: 7.5000 mg | ORAL_TABLET | Freq: Two times a day (BID) | ORAL | 2 refills | Status: DC

## 2022-03-31 NOTE — Addendum Note (Signed)
Addended by: Trudee Kuster on: 03/31/2022 03:14 PM   Modules accepted: Orders

## 2022-03-31 NOTE — Telephone Encounter (Signed)
Left patient a detailed voice message to return call to office.   

## 2022-03-31 NOTE — Telephone Encounter (Signed)
Spoke with patient and he stated he would like his medication list updated and refills on buspirone and pantoprazole. Buspirone isn't active on his list. Is it ok to refill?

## 2022-03-31 NOTE — Addendum Note (Signed)
Addended by: Fanny Bien B on: 03/31/2022 03:57 PM   Modules accepted: Orders

## 2022-04-04 ENCOUNTER — Other Ambulatory Visit: Payer: Self-pay

## 2022-04-13 ENCOUNTER — Ambulatory Visit: Payer: Medicare Other | Admitting: Family Medicine

## 2022-05-02 ENCOUNTER — Ambulatory Visit: Payer: Medicare Other | Admitting: Family Medicine

## 2022-05-15 ENCOUNTER — Ambulatory Visit (INDEPENDENT_AMBULATORY_CARE_PROVIDER_SITE_OTHER): Payer: Medicare Other | Admitting: Family Medicine

## 2022-05-15 ENCOUNTER — Encounter: Payer: Self-pay | Admitting: Family Medicine

## 2022-05-15 VITALS — BP 136/86 | HR 88 | Temp 97.6°F | Wt 227.4 lb

## 2022-05-15 DIAGNOSIS — E1169 Type 2 diabetes mellitus with other specified complication: Secondary | ICD-10-CM | POA: Diagnosis not present

## 2022-05-15 DIAGNOSIS — K7469 Other cirrhosis of liver: Secondary | ICD-10-CM

## 2022-05-15 DIAGNOSIS — E8801 Alpha-1-antitrypsin deficiency: Secondary | ICD-10-CM

## 2022-05-15 DIAGNOSIS — Z23 Encounter for immunization: Secondary | ICD-10-CM

## 2022-05-15 DIAGNOSIS — R7989 Other specified abnormal findings of blood chemistry: Secondary | ICD-10-CM | POA: Diagnosis not present

## 2022-05-15 LAB — POCT GLYCOSYLATED HEMOGLOBIN (HGB A1C): Hemoglobin A1C: 6 % — AB (ref 4.0–5.6)

## 2022-05-15 NOTE — Patient Instructions (Signed)
It was a pleasure to see you today! Thank you for choosing [facility] for your primary care. Martin Frank was seen for diabetes follow up.   Your A1C was great at 6.0.  We are referring to Gastroenterology and getting a liver ultrasound.

## 2022-05-15 NOTE — Assessment & Plan Note (Signed)
Mildly elevated LFTs and endorsed history of alpha-1 antitrypsin deficiency Abdominal ultrasound Referral to gastroenterology

## 2022-05-15 NOTE — Assessment & Plan Note (Signed)
Hemoglobin A1c 6.0  stable on metformin 500 mg twice daily Denies polyuria polydipsia Follow-up 6 months

## 2022-05-15 NOTE — Progress Notes (Signed)
Assessment/Plan:   Problem List Items Addressed This Visit       Respiratory   Alpha-1-antitrypsin deficiency ([place]) - Primary    Mildly elevated LFTs and endorsed history of alpha-1 antitrypsin deficiency Abdominal ultrasound Referral to gastroenterology       Relevant Orders   US ABDOMEN LIMITED RUQ (LIVER/GB)   Ambulatory referral to Gastroenterology     Endocrine   Type 2 diabetes mellitus with other specified complication (HCC)    Hemoglobin A1c 6.0  stable on metformin 500 mg twice daily Denies polyuria polydipsia Follow-up 6 months       Relevant Orders   POCT glycosylated hemoglobin (Hb A1C) (Completed)   Other Visit Diagnoses     Elevated LFTs       Relevant Orders   Ambulatory referral to Gastroenterology   Need for vaccination       Relevant Orders   Flu Vaccine QUAD High Dose(Fluad) (Completed)   Pneumococcal conjugate vaccine 20-valent (Prevnar 20) (Completed)          Subjective:  HPI:  Martin Frank is a 66 y.o. male who has CAD (coronary artery disease); Hyperlipidemia; Other forms of angina pectoris; Chronic systolic (congestive) heart failure ([place]); Type 2 diabetes mellitus with other specified complication ([place]); Essential hypertension; ED (erectile dysfunction) of organic origin; MDD (major depressive disorder), recurrent episode, moderate ([place]); Gastroesophageal reflux disease; Polyneuropathy associated with underlying disease ([place]); Thrombocytopenia ([place]); Hyponatremia; and Alpha-1-antitrypsin deficiency ([place]) on their problem list..   He  has a past medical history of Anterior myocardial infarction [facility]) (11/2008), CAD (coronary artery disease), Cardiomyopathy, ischemic (05/07/2013), Chronic ischemic heart disease (11/02/8097), Chronic systolic heart failure ([place]), Depression, Former smoker (12/12/2017), GERD (gastroesophageal reflux disease) (4 years), H/O heart artery stent (05/20/2019), cardiovascular stress test (2015), Hyperlipidemia,  Hypertension, Ischemic cardiomyopathy, MI (myocardial infarction) ([place]) (? 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 Fasting. Patient will schedule Diabetic eye exam) .   DIABETES Type II, established problem, Stable Medications: metformin, Reports taking and tolerating without side effects. Blood Sugars per patient: Diet-low carb Regular Exercise-not assessed Interim History- not significant  ROS: Denies Polyuria,Polydipsia, Denies  Hypoglycemia symptoms (palpitations, tremors, anxiousness)    Alpha Antitrypsin Deficiency. Patient with elevated LFTs seen on CMP with mildly elevated AST/ALT, most recent on 01/2022. Albumin was wnl. 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 H says that he has never seen a gastroenterologist.  Patient has had elevated LFTs ongoing for several years, evidenced in his chart as well by patient history. Abdominal imaging is remote, but CT abdomen/pelvis in 2010 and 2011 did not show any evidence of fatty liver, hepatosteatosis, nodule, or cirrhosis. Patient is without abdominal pain, swelling, easy bruising. Respiratory ROS: no cough, shortness of breath, or wheezing   Past Surgical History:  Procedure Laterality Date   CARDIAC CATHETERIZATION  X2   CARDIAC VALVE REPLACEMENT     CORONARY ANGIOPLASTY WITH STENT PLACEMENT  5/2010X 2   "1+1" (12/05/2017)   LAPAROSCOPIC CHOLECYSTECTOMY     LEFT HEART CATH AND CORONARY ANGIOGRAPHY N/A 12/06/2017   Procedure: LEFT HEART CATH AND CORONARY ANGIOGRAPHY;  Surgeon: Lorretta Harp, MD;  Location: [facility];  Service: Cardiovascular;  Laterality: N/A;   ORCHIECTOMY Right 1960s    Outpatient Medications Prior to Visit  Medication Sig  Dispense Refill   busPIRone (BUSPAR) 7.5 MG tablet Take 1  tablet (7.5 mg total) by mouth 2 (two) times daily. 60 tablet 2   carvedilol (COREG) 6.25 MG tablet Take 1 tablet (6.25 mg total) by mouth 2 (two) times daily with a meal. 60 tablet 6   lisinopril (ZESTRIL) 2.5 MG tablet Take 1 tablet (2.5 mg total) by mouth daily. PLEASE CONTACT OFFICE FOR ADDITIONAL REFILLS 210 tablet 0   metFORMIN (GLUCOPHAGE) 500 MG tablet Take 1 tablet (500 mg total) by mouth 2 (two) times daily with a meal. 180 tablet 1   nitroGLYCERIN (NITROSTAT) 0.4 MG SL tablet PLACE 1 TABLET (0.4 MG TOTAL) UNDER THE TONGUE EVERY 5 (FIVE) MINUTES AS NEEDED FOR CHEST PAIN (MAY REPEAT X 3 THAN CALL 911). 25 tablet 3   pantoprazole (PROTONIX) 40 MG tablet Take 1 tablet (40 mg total) by mouth daily. 90 tablet 0   rosuvastatin (CRESTOR) 40 MG tablet Take 1 tablet (40 mg total) by mouth daily. 30 tablet 6   citalopram (CELEXA) 40 MG tablet Take 1 tablet (40 mg total) by mouth daily. 30 tablet 2   doxycycline (VIBRAMYCIN) 100 MG capsule Take 1 capsule (100 mg total) by mouth 2 (two) times daily. (Patient not taking: Reported on 02/13/2022) 20 capsule 0   No facility-administered medications prior to visit.    Family History  Problem Relation Age of Onset   Diabetes Mother    Rheum arthritis Mother    CAD Father        MI at age 31   CAD Brother        MI at age 53   Post-traumatic stress disorder Brother    Suicidality Brother    Cancer Brother 58       Brain Tumor   Diabetes Brother    Atrial fibrillation Son     Social History   Socioeconomic History   Marital status: Married    Spouse name: Not on file   Number of children: 2   Years of education: Not on file   Highest education level: Not on file  Occupational History   Occupation: Teacher, adult education    Comment: Teacher, adult education  Tobacco Use   Smoking status: Former    Packs/day: 1.00    Years: 10.00    Total pack years: 10.00    Types: Cigarettes    Quit date: 07/31/1981    Years since quitting: 40.8   Smokeless  tobacco: Never  Vaping Use   Vaping Use: Never used  Substance and Sexual Activity   Alcohol use: Yes    Alcohol/week: 3.0 standard drinks of alcohol    Types: 3 Glasses of wine per week   Drug use: Not Currently    Types: Marijuana    Comment: 12/05/2017 "nothing since my early 20's"   Sexual activity: Yes  Other Topics Concern   Not on file  Social History Narrative   Married, 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 Awake and conversant.  Eyes: Normal conjunctiva, anicteric. Round symmetric pupils.  ENT: Hearing grossly intact. No nasal discharge.  Neck: Neck is supple. No masses or thyromegaly.  Respiratory: Respirations are non-labored. No auditory wheezing.  CTA B Skin: Warm. No rashes or ulcers.  Psych: Alert and oriented. Cooperative, Appropriate mood and affect, Normal judgment.  CV: No cyanosis or JVD, RRR, no MRG ABD: Nontender nondistended MSK: Normal ambulation. No clubbing  Neuro: Sensation and CN II-XII grossly normal.        Martin Nash, MD, MS

## 2022-05-17 ENCOUNTER — Telehealth: Payer: Self-pay | Admitting: Internal Medicine

## 2022-05-17 ENCOUNTER — Telehealth (HOSPITAL_BASED_OUTPATIENT_CLINIC_OR_DEPARTMENT_OTHER): Payer: Self-pay

## 2022-05-17 NOTE — Telephone Encounter (Signed)
Before I make a decision I need to know how the patient knows he has alpha 1 antitrypsin deficiency.  I can see no records available that document that other than his history.  And who made that diagnosis?

## 2022-05-17 NOTE — Telephone Encounter (Signed)
I see that he told the doctor he had alpha-1-antitrypsin deficiency but there are not records/labs to support that and I want to know if the patient knows who diagnosed him and how.  We might need to do a release of information before we schedule

## 2022-05-17 NOTE — Telephone Encounter (Signed)
Good Morning Dr. Carlean Purl,  Patient called stating that his PCP Dr. Grandville Silos sent over a referral for him to be seen for Alpha-1-antitrypsin deficiency and elevated LFT's. After looking at patients chart we was a patient of yours in the past and was last seen in November of 2022 by [facility]. Patients records from the office visit are in [place], will you please review and advise on scheduling?   Thank you.

## 2022-05-17 NOTE — Telephone Encounter (Signed)
Per referral Dr. Grandville Silos office note and lab results from 05/15/2022 show the following diagnoses for further evaluation.

## 2022-05-23 ENCOUNTER — Ambulatory Visit (HOSPITAL_BASED_OUTPATIENT_CLINIC_OR_DEPARTMENT_OTHER)
Admission: RE | Admit: 2022-05-23 | Discharge: 2022-05-23 | Disposition: A | Discharge status: Home / Self Care | Payer: Medicare Other | Source: Ambulatory Visit | Attending: Family Medicine | Admitting: Family Medicine

## 2022-05-23 DIAGNOSIS — E8801 Alpha-1-antitrypsin deficiency: Secondary | ICD-10-CM | POA: Diagnosis not present

## 2022-05-23 DIAGNOSIS — R945 Abnormal results of liver function studies: Secondary | ICD-10-CM | POA: Diagnosis not present

## 2022-05-23 NOTE — Addendum Note (Signed)
Addended by: Josephine Igo B on: 05/23/2022 09:45 AM   Modules accepted: Orders

## 2022-05-25 ENCOUNTER — Telehealth: Payer: Self-pay | Admitting: Family Medicine

## 2022-05-25 NOTE — Telephone Encounter (Signed)
Caller Name: Dmoni Fortson Call back phone #: [phone]  Reason for Call: Pt was hoping to get a call back to go over ultrasound results from 10/24

## 2022-05-25 NOTE — Telephone Encounter (Signed)
Contacted patient and provided results. Patient verbalized understanding.   

## 2022-05-30 ENCOUNTER — Telehealth: Payer: Self-pay | Admitting: Family Medicine

## 2022-05-30 NOTE — Telephone Encounter (Signed)
Patient scheduled on 06/01/22 at 2 pm with PCP.

## 2022-05-30 NOTE — Telephone Encounter (Signed)
Spoke with patient and he said the Alpha 1 diag is from a long time ago and that doctor has retired. The reason for his urgency is the CT results from 05/23/2022 for cirrhosis. The patient expressed high concerns about this result and is seeking advise.

## 2022-05-30 NOTE — Telephone Encounter (Signed)
Pt has become very anxious ever since he found out he has cirrhosis of the liver. He is wanting his anxiety medications increased or something else to be done. Please advise pt @ [phone]

## 2022-05-31 NOTE — Telephone Encounter (Signed)
OK - set up next available appointment

## 2022-06-01 ENCOUNTER — Encounter: Payer: Self-pay | Admitting: Family Medicine

## 2022-06-01 ENCOUNTER — Telehealth: Payer: Self-pay | Admitting: Internal Medicine

## 2022-06-01 ENCOUNTER — Ambulatory Visit (INDEPENDENT_AMBULATORY_CARE_PROVIDER_SITE_OTHER): Payer: Medicare Other | Admitting: Family Medicine

## 2022-06-01 VITALS — BP 136/84 | HR 70 | Temp 97.0°F | Wt 229.4 lb

## 2022-06-01 DIAGNOSIS — F331 Major depressive disorder, recurrent, moderate: Secondary | ICD-10-CM

## 2022-06-01 DIAGNOSIS — F419 Anxiety disorder, unspecified: Secondary | ICD-10-CM | POA: Diagnosis not present

## 2022-06-01 MED ORDER — BUSPIRONE HCL 10 MG PO TABS: 10.0000 mg | ORAL_TABLET | Freq: Three times a day (TID) | ORAL | 0 refills | Status: DC

## 2022-06-01 MED ORDER — CITALOPRAM HYDROBROMIDE 40 MG PO TABS: 40.0000 mg | ORAL_TABLET | Freq: Every day | ORAL | 2 refills | Status: DC

## 2022-06-01 NOTE — Progress Notes (Signed)
Assessment/Plan:   Problem List Items Addressed This Visit       Other   MDD (major depressive disorder), recurrent episode, moderate ([place])    Worsening with passive SI without plan, although patient with a history of prior suicide attempt Detailed assessment was below, patient endorses that he has unlikely to commit suicide, due to relation with his wife, does have significant worry about recent diagnosis of cirrhosis Validated patient's feelings of depression and worry given recent diagnosis, did discuss that I was unable to discuss prognosis and defer to gastroenterology with but she is in the process of establishing care Discussed treatment options including referral to psychiatry, urgent psychiatric services through [facility], referral to counseling, medication adjustment Patient is amenable to increasing BuSpar And states that he will follow-up with [facility] as provided in handout, he is also familiar with the suicide crisis hotline Close follow-up with patient in 2 weeks, increase BuSpar to 10 mg 3 times daily and continue citalopram       Relevant Medications   citalopram (CELEXA) 40 MG tablet   busPIRone (BUSPAR) 10 MG tablet   Other Visit Diagnoses     Anxiety    -  Primary   Relevant Medications   citalopram (CELEXA) 40 MG tablet   busPIRone (BUSPAR) 10 MG tablet   Moderate episode of recurrent major depressive disorder (HCC)       Relevant Medications   citalopram (CELEXA) 40 MG tablet   busPIRone (BUSPAR) 10 MG tablet          Subjective:  HPI:  Martin Frank is a 66 y.o. male who has CAD (coronary artery disease); Hyperlipidemia; Other forms of angina pectoris; Chronic systolic (congestive) heart failure ([place]); Type 2 diabetes mellitus with other specified complication ([place]); Essential hypertension; ED (erectile dysfunction) of organic origin; MDD (major depressive disorder), recurrent episode, moderate ([place]); Gastroesophageal reflux  disease; Polyneuropathy associated with underlying disease ([place]); Thrombocytopenia ([place]); Hyponatremia; and Alpha-1-antitrypsin deficiency ([place]) on their problem list..   He  has a past medical history of Anterior myocardial infarction [facility]) (11/2008), CAD (coronary artery disease), Cardiomyopathy, ischemic (05/07/2013), Chronic ischemic heart disease (01/05/1244), Chronic systolic heart failure ([place]), Depression, Former smoker (12/12/2017), GERD (gastroesophageal reflux disease) (4 years), H/O heart artery stent (05/20/2019), cardiovascular stress test (2015), Hyperlipidemia, Hypertension, Ischemic cardiomyopathy, MI (myocardial infarction) ([place]) (? 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 ) .   Depression/Anxiety, established problem, worsening Current Medications: Citalopram 40 mg, BuSpar 7.5 mg twice daily Side Effects: None Current Symptoms/Interim History: Worsening depression and special anxiety since recent diagnosis of cirrhosis.  Patient has been trying to follow-up with Dr. Arelia Longest with GI, they are in the process of trying to schedule appointment.  Patient states that his worry is mostly surrounding the unknown prognosis of cirrhosis including whether it will be a protracted slow decline especially being a burden on his wife.  Patient has seen psychiatry in the past, Dr. Erling Cruz.  This was 2 years ago.  Patient has been on higher doses of BuSpar and is wanting to increase.  Patient states that his wife is worried about him and is encouraging him to pursue treatment.   Suicide Assessment  Plan: - How specific is the plan: none - How lethal are the means: none - Does the patient have access to the  means: n/a - Does the patient have social support: wife  Protective factors (what has kept the patient from  self-harm thus far):  relationship with wife  Substance use / abuse:  none  Presence of hallucinations / delusions: none  History of SI / Attempts: History of attempted suicide, 10 years ago,  cutting wrist  Family history of attempted or completed suicide: not assessed  Duration and Intensity of SI:  Past few weeks since diagnosis of cirrhosis  History of prior psychiatric hospitalizations: Associated with suicide attempt as above  Chart review for additional risk factors (cite chronic pain, insomnia, panic attacks, age, gender, if present): Chronic depression  Coping mechanisms: Medication  How likely are you to act on these thoughts of hurting yourself or ending your life sometime over the next month? NOT LIKELY AT ALL      06/01/2022    2:44 PM  Depression screen PHQ 2/9  Decreased Interest 3  Down, Depressed, Hopeless 3  PHQ - 2 Score 6  Altered sleeping 3  Tired, decreased energy 3  Change in appetite 3  Feeling bad or failure about yourself  3  Trouble concentrating 3  Moving slowly or fidgety/restless 1  Suicidal thoughts 2  PHQ-9 Score 24  Difficult doing work/chores Very difficult       06/01/2022    2:44 PM  GAD 7 : Generalized Anxiety Score  Nervous, Anxious, on Edge 3  Control/stop worrying 3  Worry too much - different things 1  Trouble relaxing 1  Restless 0  Easily annoyed or irritable 1  Afraid - awful might happen 3  Total GAD 7 Score 12  Anxiety Difficulty Very difficult    ROS: No  HI.   Past Surgical History:  Procedure Laterality Date   CARDIAC CATHETERIZATION  X2   CARDIAC VALVE REPLACEMENT     CORONARY ANGIOPLASTY WITH STENT PLACEMENT  5/2010X 2   "1+1" (12/05/2017)   LAPAROSCOPIC CHOLECYSTECTOMY     LEFT HEART CATH AND CORONARY ANGIOGRAPHY N/A 12/06/2017   Procedure: LEFT HEART CATH AND CORONARY ANGIOGRAPHY;  Surgeon: Runell Gess, MD;  Location: [facility];  Service: Cardiovascular;  Laterality: N/A;   ORCHIECTOMY  Right 1960s    Outpatient Medications Prior to Visit  Medication Sig Dispense Refill   carvedilol (COREG) 6.25 MG tablet Take 1 tablet (6.25 mg total) by mouth 2 (two) times daily with a meal. 60 tablet 6   lisinopril (ZESTRIL) 2.5 MG tablet Take 1 tablet (2.5 mg total) by mouth daily. PLEASE CONTACT OFFICE FOR ADDITIONAL REFILLS 210 tablet 0   metFORMIN (GLUCOPHAGE) 500 MG tablet Take 1 tablet (500 mg total) by mouth 2 (two) times daily with a meal. 180 tablet 1   nitroGLYCERIN (NITROSTAT) 0.4 MG SL tablet PLACE 1 TABLET (0.4 MG TOTAL) UNDER THE TONGUE EVERY 5 (FIVE) MINUTES AS NEEDED FOR CHEST PAIN (MAY REPEAT X 3 THAN CALL 911). 25 tablet 3   pantoprazole (PROTONIX) 40 MG tablet Take 1 tablet (40 mg total) by mouth daily. 90 tablet 0   rosuvastatin (CRESTOR) 40 MG tablet Take 1 tablet (40 mg total) by mouth daily. 30 tablet 6   busPIRone (BUSPAR) 7.5 MG tablet Take 1 tablet (7.5 mg total) by mouth 2 (two) times daily. 60 tablet 2   citalopram (CELEXA) 40 MG tablet Take 1 tablet (40 mg total) by mouth daily. 30 tablet 2   No facility-administered medications prior to visit.    Family History  Problem Relation Age  of Onset   Diabetes Mother    Rheum arthritis Mother    CAD Father        MI at age 4   CAD Brother        MI at age 66   Post-traumatic stress disorder Brother    Suicidality Brother    Cancer Brother 36       Brain Tumor   Diabetes Brother    Atrial fibrillation Son     Social History   Socioeconomic History   Marital status: Married    Spouse name: Not on file   Number of children: 2   Years of education: Not on file   Highest education level: Not on file  Occupational History   Occupation: Teacher, adult education    Comment: Teacher, adult education  Tobacco Use   Smoking status: Former    Packs/day: 1.00    Years: 10.00    Total pack years: 10.00    Types: Cigarettes    Quit date: 07/31/1981    Years since quitting: 40.8   Smokeless tobacco: Never  Vaping Use    Vaping Use: Never used  Substance and Sexual Activity   Alcohol use: Yes    Alcohol/week: 3.0 standard drinks of alcohol    Types: 3 Glasses of wine per week   Drug use: Not Currently    Types: Marijuana    Comment: 12/05/2017 "nothing since my early 20's"   Sexual activity: Yes  Other Topics Concern   Not on file  Social History Narrative   Married, 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 Awake and conversant.  Eyes: Normal conjunctiva, anicteric. Round symmetric pupils.  ENT: Hearing grossly intact. No nasal discharge.  Neck: Neck is supple. No masses or thyromegaly.  Respiratory: Respirations are non-labored. No auditory wheezing.  Skin: Warm. No rashes or ulcers.  Psych: Alert and oriented. Cooperative, anxious affect CV: No cyanosis or JVD MSK: Normal ambulation. No clubbing  Neuro: Sensation and CN II-XII grossly normal.        [place] Nash, MD, MS

## 2022-06-01 NOTE — Patient Instructions (Signed)
For urgent psychiatric assistance you can go to   [facility] Phone: [phone]  [address] [place], [place] [address]  Hours: Open 24/7, No appointment required.  [place], [place] [address] Phone: [phone]  When you're going through tough times, it's easy to feel lonely and overwhelmed. Remember that YOU ARE NOT ALONE and we at [facility] want to support you during this difficult time.  There are many ways to get help and support when you are ready.  You can call the following help lines: - National suicide hotline 417-306-0142) - [address] (1-800-SUICIDE)   You can schedule an appointment with a team member with your primary care doc or one of our counselors.  We are all here to support you.  A doctor is on call 24/7   There are many treatments that can help people during difficult times, and we can talk with you about medications, counseling, diet, and other options. We want to offer you HOPE that it won't always feel this bad.   If you are seriously thinking about hurting yourself or have a plan, please call 911 or go to any emergency room right away for immediate help.

## 2022-06-01 NOTE — Assessment & Plan Note (Signed)
Worsening with passive SI without plan, although patient with a history of prior suicide attempt Detailed assessment was below, patient endorses that he has unlikely to commit suicide, due to relation with his wife, does have significant worry about recent diagnosis of cirrhosis Validated patient's feelings of depression and worry given recent diagnosis, did discuss that I was unable to discuss prognosis and defer to gastroenterology with but she is in the process of establishing care Discussed treatment options including referral to psychiatry, urgent psychiatric services through [facility], referral to counseling, medication adjustment Patient is amenable to increasing BuSpar And states that he will follow-up with [facility] as provided in handout, he is also familiar with the suicide crisis hotline Close follow-up with patient in 2 weeks, increase BuSpar to 10 mg 3 times daily and continue citalopram

## 2022-06-07 ENCOUNTER — Telehealth: Payer: Self-pay | Admitting: Family Medicine

## 2022-06-07 NOTE — Telephone Encounter (Signed)
Pt stopped by without an appointment just to let us know he loves our office, loves Dr. Janee Morn and loves his assistant. He made the statement "we may hear something through [facility] negative". He wanted to assure [place] that this in no way reflects on our office [place].

## 2022-06-09 NOTE — Telephone Encounter (Signed)
Patient has been scheduled for OV on 12/1 at 9:50

## 2022-06-16 ENCOUNTER — Other Ambulatory Visit: Payer: Self-pay | Admitting: Family Medicine

## 2022-06-16 DIAGNOSIS — I10 Essential (primary) hypertension: Secondary | ICD-10-CM

## 2022-06-16 DIAGNOSIS — I2510 Atherosclerotic heart disease of native coronary artery without angina pectoris: Secondary | ICD-10-CM

## 2022-06-16 NOTE — Telephone Encounter (Signed)
Chart supports rx. Last OV: 06/01/2022 Next OV: 06/20/2022

## 2022-06-20 ENCOUNTER — Ambulatory Visit: Payer: Medicare Other | Admitting: Family Medicine

## 2022-06-24 ENCOUNTER — Other Ambulatory Visit: Payer: Self-pay | Admitting: Family Medicine

## 2022-06-24 DIAGNOSIS — F331 Major depressive disorder, recurrent, moderate: Secondary | ICD-10-CM

## 2022-06-24 DIAGNOSIS — K219 Gastro-esophageal reflux disease without esophagitis: Secondary | ICD-10-CM

## 2022-06-26 NOTE — Telephone Encounter (Signed)
Chart supports rx. Last OV: [id number] Next OV: [id number]

## 2022-06-30 ENCOUNTER — Encounter: Payer: Self-pay | Admitting: Internal Medicine

## 2022-06-30 ENCOUNTER — Ambulatory Visit (INDEPENDENT_AMBULATORY_CARE_PROVIDER_SITE_OTHER): Payer: Medicare Other | Admitting: Internal Medicine

## 2022-06-30 ENCOUNTER — Other Ambulatory Visit (INDEPENDENT_AMBULATORY_CARE_PROVIDER_SITE_OTHER): Payer: Medicare Other

## 2022-06-30 VITALS — BP 128/83 | HR 65 | Ht 75.0 in | Wt 226.0 lb

## 2022-06-30 DIAGNOSIS — R932 Abnormal findings on diagnostic imaging of liver and biliary tract: Secondary | ICD-10-CM | POA: Diagnosis not present

## 2022-06-30 DIAGNOSIS — R945 Abnormal results of liver function studies: Secondary | ICD-10-CM | POA: Diagnosis not present

## 2022-06-30 DIAGNOSIS — I2583 Coronary atherosclerosis due to lipid rich plaque: Secondary | ICD-10-CM

## 2022-06-30 DIAGNOSIS — Z860100 Personal history of colon polyps, unspecified: Secondary | ICD-10-CM | POA: Insufficient documentation

## 2022-06-30 DIAGNOSIS — E8801 Alpha-1-antitrypsin deficiency: Secondary | ICD-10-CM

## 2022-06-30 DIAGNOSIS — I2510 Atherosclerotic heart disease of native coronary artery without angina pectoris: Secondary | ICD-10-CM

## 2022-06-30 DIAGNOSIS — R748 Abnormal levels of other serum enzymes: Secondary | ICD-10-CM

## 2022-06-30 DIAGNOSIS — R634 Abnormal weight loss: Secondary | ICD-10-CM | POA: Diagnosis not present

## 2022-06-30 LAB — CBC WITH DIFFERENTIAL/PLATELET
Basophils Absolute: 0 10*3/uL (ref 0.0–0.1)
Basophils Relative: 0.6 % (ref 0.0–3.0)
Eosinophils Absolute: 0.2 10*3/uL (ref 0.0–0.7)
Eosinophils Relative: 2.4 % (ref 0.0–5.0)
HCT: 45.4 % (ref 39.0–52.0)
Hemoglobin: 15.5 g/dL (ref 13.0–17.0)
Lymphocytes Relative: 27.3 % (ref 12.0–46.0)
Lymphs Abs: 1.9 10*3/uL (ref 0.7–4.0)
MCHC: 34.2 g/dL (ref 30.0–36.0)
MCV: 91.5 fl (ref 78.0–100.0)
Monocytes Absolute: 0.6 10*3/uL (ref 0.1–1.0)
Monocytes Relative: 9 % (ref 3.0–12.0)
Neutro Abs: 4.1 10*3/uL (ref 1.4–7.7)
Neutrophils Relative %: 60.7 % (ref 43.0–77.0)
Platelets: 186 10*3/uL (ref 150.0–400.0)
RBC: 4.96 Mil/uL (ref 4.22–5.81)
RDW: 13 % (ref 11.5–15.5)
WBC: 6.8 10*3/uL (ref 4.0–10.5)

## 2022-06-30 LAB — COMPREHENSIVE METABOLIC PANEL
ALT: 43 U/L (ref 0–53)
AST: 40 U/L — ABNORMAL HIGH (ref 0–37)
Albumin: 4.4 g/dL (ref 3.5–5.2)
Alkaline Phosphatase: 95 U/L (ref 39–117)
BUN: 11 mg/dL (ref 6–23)
CO2: 28 mEq/L (ref 19–32)
Calcium: 10 mg/dL (ref 8.4–10.5)
Chloride: 102 mEq/L (ref 96–112)
Creatinine, Ser: 0.85 mg/dL (ref 0.40–1.50)
GFR: 91.12 mL/min (ref >60.00)
Glucose, Bld: 111 mg/dL — ABNORMAL HIGH (ref 70–99)
Potassium: 5.1 mEq/L (ref 3.5–5.1)
Sodium: 138 mEq/L (ref 135–145)
Total Bilirubin: 0.8 mg/dL (ref 0.2–1.2)
Total Protein: 7.5 g/dL (ref 6.0–8.3)

## 2022-06-30 LAB — PROTIME-INR
INR: 1.1 ratio — ABNORMAL HIGH (ref 0.8–1.0)
Prothrombin Time: 11.9 s (ref 9.6–13.1)

## 2022-06-30 NOTE — Progress Notes (Signed)
Martin Frank 66 y.o. 03-19-1956 [id number]  Assessment & Plan:   Encounter Diagnoses  Name Primary?   Abnormal ultrasound of liver Yes   Abnormal transaminases    Unintentional weight loss    Abnormal results of liver function studies    Alpha-1-antitrypsin deficiency ([place])?    History of colonic polyps    He may have cirrhosis.  Given the weight loss and these findings on ultrasound we will order CT abdomen and pelvis with contrast.  Lab workup as below:  Orders Placed This Encounter  Procedures   CT Abdomen Pelvis W Contrast   CBC with Differential/Platelet   Comp Met (CMET)   Protime-INR    I am going to ask for the records from Dr. Sandi Mariscal to try to understand what has been done regarding liver workup in the past.  We may need to order some additional studies or repeat some things.  Is unclear to me if he really has alpha-1 antitrypsin deficiency and I suspect if anything nonalcoholic fatty liver disease plus or minus some alcohol contribution is the likely cause of liver damage and transaminitis in his case.  Earliest routine repeat colonoscopy would be due January 2024.  Sort through these other issues first and then discuss and arrange.  Subjective:   Chief Complaint: Abnormal liver on ultrasound question cirrhosis  HPI 66 year old white man with a history of prediabetes, fatty liver diagnosis in the past, reported alpha 1 antitrypsin deficiency who presents for evaluation after an ultrasound of the right upper quadrant for abnormal transaminases demonstrated a coarsened echogenicity and nodular contour of the liver, 05/23/2022.  The patient reported to his primary care provider and today to me that he has a history of alpha-1 antitrypsin deficiency apparently diagnosed by Dr. Derinda Late years ago.  There was also mention of fatty liver disease.  He has never been a heavy drinker but has used alcohol off and on though stopped completely after the ultrasound  findings returned.  He reported a few glasses of wine a week to several and maybe a shot of whiskey at times.  Never daily and does not sound like more than 2 in a day.  He said he had extensive testing with Dr. Sandi Mariscal years ago but I do not have those records.  He reports Dr. Sandi Mariscal having some concern about lung problems with the alpha-1 antitrypsin issue.  No family history of liver problems.  The patient denies multiple sexual partners or intravenous drug use or other infectious hepatitis risk factors.  Hepatitis C antibody was negative in 2021 [facility].  There have been some minor transaminase elevations over the years upon lab review over the most recent years in care everywhere.  Platelet counts have been normal.  And so were white blood cell counts.  In the past several months or longer he has started to notice unintentional weight loss.  There is no dysphagia abdominal pain nausea vomiting etc.  No significant change in medications reported either.  Wt Readings from Last 3 Encounters:  06/30/22 226 lb (102.5 kg)  06/01/22 229 lb 6.4 oz (104.1 kg)  05/15/22 227 lb 6.4 oz (103.1 kg)  May 2023 239#   He has seen other GI providers over the years.  A colonoscopy by me in 2006 was negative.  There was a reported personal history of adenomatous polyps and a family history of GI cancer and a 5-year follow-up was recommended.  Current family history does not indicate colon  cancer.  Review of a note from 06/07/2021 [facility], Colonoscopy (1/21) = good prep, mild Sigmoid diverticulosis, medium internal hemorrhoids, and four sessile polyps were removed (three 10mTAs [facility]  He was also being seen there for reflux symptoms and having GERD treated with PPI twice daily. Allergies  Allergen Reactions   Bee Venom Anaphylaxis   Current Meds  Medication Sig   busPIRone (BUSPAR) 10 MG tablet Take 1 tablet (10 mg total) by mouth  3 (three) times daily.   carvedilol (COREG) 6.25 MG tablet Take 1 tablet (6.25 mg total) by mouth 2 (two) times daily with a meal.   citalopram (CELEXA) 40 MG tablet Take 1 tablet (40 mg total) by mouth daily.   lisinopril (ZESTRIL) 2.5 MG tablet TAKE 1 TABLET (2.5 MG TOTAL) BY MOUTH DAILY. PLEASE CONTACT OFFICE FOR ADDITIONAL REFILLS   metFORMIN (GLUCOPHAGE) 500 MG tablet Take 1 tablet (500 mg total) by mouth 2 (two) times daily with a meal.   nitroGLYCERIN (NITROSTAT) 0.4 MG SL tablet PLACE 1 TABLET (0.4 MG TOTAL) UNDER THE TONGUE EVERY 5 (FIVE) MINUTES AS NEEDED FOR CHEST PAIN (MAY REPEAT X 3 THAN CALL 911).   NON FORMULARY Milk thisel   Omega-3 Fatty Acids (FISH OIL) 1000 MG CPDR Take by mouth.   pantoprazole (PROTONIX) 40 MG tablet TAKE 1 TABLET BY MOUTH EVERY DAY   rosuvastatin (CRESTOR) 40 MG tablet Take 1 tablet (40 mg total) by mouth daily.   Past Medical History:  Diagnosis Date   [facility]   CAD (coronary artery disease)    remote anterior MI with VT and DES to the LAD in May of 2010; [place] patency, EF of 35 to 40% and possible vasospasm versus potential small vessel disease.    Cardiomyopathy, ischemic 05/07/2013   Chronic ischemic heart disease 04/06/2013   Formatting of this note might be different from the original. STORY: 10/2008 and hospitalized 01/2013 with positive troponins but widely patent arteries on cath. Formatting of this note might be different from the original. STORY: 10/2008 and hospitalized 01/2013 with positive troponins but widely patent arteries on cath. Formatting of this note might be different from the original. STORY: 10/2008 and    Chronic systolic heart failure ([facility]   Depression    Former smoker 12/12/2017   GERD (gastroesophageal reflux disease) 4 years   H/O heart artery stent 05/20/2019   Hx of cardiovascular stress test 2015   Lexiscan Myoview (11/2013):  Anteroseptal, apical  anterior, apex, apical inferior scar; no ischemia, EF 50%; Intermediate Risk   Hyperlipidemia    Hypertension    Ischemic cardiomyopathy    a.Echo (11/2013):  EF 45-50%, apical HK   MI (myocardial infarction) ([facility]? 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   Pancreatitis    Type II diabetes mellitus ([facility]   Past Surgical History:  Procedure Laterality Date   CARDIAC CATHETERIZATION  X2   CARDIAC VALVE REPLACEMENT     COLONOSCOPY     Multiple-last 2021 history of polyps   CORONARY ANGIOPLASTY WITH STENT PLACEMENT  5/2010X 2   "1+1" (12/05/2017)   LAPAROSCOPIC CHOLECYSTECTOMY     LEFT HEART CATH AND CORONARY ANGIOGRAPHY N/A 12/06/2017   Procedure: LEFT HEART CATH AND CORONARY ANGIOGRAPHY;  Surgeon: BeLorretta HarpMD;  [facility];  Service: Cardiovascular;  Laterality: N/A;   ORCHIECTOMY Right 1960s   Social History   Social History Narrative   Married, 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   Review of Systems  As per HPI.  All other review of systems negative. Objective:   Physical Exam _0  128/83   Pulse 65   Ht _1  (1.905 m)   Wt 226 lb (102.5 kg)   BMI 28.25 kg/m @  General:  Well-developed, well-nourished and in no acute distress Eyes:  anicteric. ENT:   Mouth and posterior pharynx free of lesions.  Neck:   supple w/o thyromegaly or mass.  Lungs: Clear to auscultation bilaterally. Heart:  S1S2, no rubs, murmurs, gallops. Abdomen:  soft,  non-tender, no hepatosplenomegaly, hernia, or mass and BS+.  Lymph:  no cervical or supraclavicular adenopathy. Extremities:   no edema, cyanosis or clubbing Skin   no rash. No stigmata CLD Neuro:  A&O x 3.  Psych:  appropriate mood and  Affect.   Data Reviewed: See HPI

## 2022-06-30 NOTE — Patient Instructions (Signed)
Your provider has requested that you go to the basement level for lab work before leaving today. Press "B" on the elevator. The lab is located at the first door on the left as you exit the elevator.  You have been scheduled for a CT scan of the abdomen and pelvis at [facility], 1st floor Radiology. You are scheduled on 07/03/22 at 2:30pm. You should arrive 15 minutes prior to your appointment time for registration.   Please follow the written instructions below on the day of your exam:   1) Do not eat anything after 10:30 am  (4 hours prior to your test)     You may take any medications as prescribed with a small amount of water, if necessary. If you take any of the following medications: METFORMIN, GLUCOPHAGE, GLUCOVANCE, AVANDAMET, RIOMET, FORTAMET, [place] MET, JANUMET, GLUMETZA or METAGLIP, you MAY be asked to HOLD this medication 48 hours AFTER the exam.   The purpose of you drinking the oral contrast is to aid in the visualization of your intestinal tract. The contrast solution may cause some diarrhea. Depending on your individual set of symptoms, you may also receive an intravenous injection of x-ray contrast/dye. Plan on being at [facility] for 45 minutes or longer, depending on the type of exam you are having performed.   If you have any questions regarding your exam or if you need to reschedule, you may call [facility] at [phone] between the hours of 8:00 am and 5:00 pm, Monday-Friday.   Due to recent changes in healthcare laws, you may see the results of your imaging and laboratory studies on MyChart before your provider has had a chance to review them.  We understand that in some cases there may be results that are confusing or concerning to you. Not all laboratory results come back in the same time frame and the provider may be waiting for multiple results in order to interpret others.  Please give [place] 48 hours in order for your provider to thoroughly review all  the results before contacting the office for clarification of your results.   Thank you for choosing me and [facility].  Gatha Mayer, M.D., [facility]

## 2022-07-03 ENCOUNTER — Ambulatory Visit (HOSPITAL_COMMUNITY)
Admission: RE | Admit: 2022-07-03 | Discharge: 2022-07-03 | Disposition: A | Discharge status: Home / Self Care | Payer: Medicare Other | Source: Ambulatory Visit | Attending: Internal Medicine | Admitting: Internal Medicine

## 2022-07-03 ENCOUNTER — Encounter (HOSPITAL_COMMUNITY): Payer: Self-pay

## 2022-07-03 DIAGNOSIS — R748 Abnormal levels of other serum enzymes: Secondary | ICD-10-CM | POA: Diagnosis not present

## 2022-07-03 DIAGNOSIS — R932 Abnormal findings on diagnostic imaging of liver and biliary tract: Secondary | ICD-10-CM | POA: Insufficient documentation

## 2022-07-03 DIAGNOSIS — R634 Abnormal weight loss: Secondary | ICD-10-CM | POA: Insufficient documentation

## 2022-07-03 DIAGNOSIS — K5730 Diverticulosis of large intestine without perforation or abscess without bleeding: Secondary | ICD-10-CM | POA: Diagnosis not present

## 2022-07-03 DIAGNOSIS — K7460 Unspecified cirrhosis of liver: Secondary | ICD-10-CM | POA: Diagnosis not present

## 2022-07-03 MED ORDER — IOHEXOL 300 MG/ML  SOLN
100.0000 mL | Freq: Once | INTRAMUSCULAR | Status: AC | PRN
  Administered 2022-07-03: 100 mL via INTRAVENOUS

## 2022-07-05 ENCOUNTER — Other Ambulatory Visit: Payer: Self-pay | Admitting: Family Medicine

## 2022-07-05 DIAGNOSIS — K219 Gastro-esophageal reflux disease without esophagitis: Secondary | ICD-10-CM

## 2022-07-06 ENCOUNTER — Other Ambulatory Visit: Payer: Medicare Other

## 2022-07-06 ENCOUNTER — Encounter: Payer: Self-pay | Admitting: Internal Medicine

## 2022-07-06 ENCOUNTER — Other Ambulatory Visit: Payer: Self-pay | Admitting: Internal Medicine

## 2022-07-06 DIAGNOSIS — E8801 Alpha-1-antitrypsin deficiency: Secondary | ICD-10-CM

## 2022-07-06 DIAGNOSIS — K7460 Unspecified cirrhosis of liver: Secondary | ICD-10-CM

## 2022-07-06 NOTE — Telephone Encounter (Signed)
Spoke with pt:  Documented under result notes:  Pt verbalized understanding with all questions answered.   

## 2022-07-10 ENCOUNTER — Encounter: Payer: Self-pay | Admitting: Family Medicine

## 2022-07-11 ENCOUNTER — Encounter: Payer: Self-pay | Admitting: Family Medicine

## 2022-07-11 ENCOUNTER — Other Ambulatory Visit: Payer: Self-pay | Admitting: Family Medicine

## 2022-07-11 DIAGNOSIS — K219 Gastro-esophageal reflux disease without esophagitis: Secondary | ICD-10-CM

## 2022-07-13 ENCOUNTER — Telehealth: Payer: Self-pay | Admitting: Internal Medicine

## 2022-07-13 NOTE — Telephone Encounter (Signed)
Patient is returning call.  °

## 2022-07-13 NOTE — Telephone Encounter (Signed)
Left message for pt to call back at the office to discuss more in detail what results he is requesting:

## 2022-07-13 NOTE — Telephone Encounter (Signed)
Incoming call from patient states he is returning a call from [place].

## 2022-07-14 NOTE — Telephone Encounter (Signed)
Pt requesting results from recent labs: Chart reviewed:  Noted some labs are still pending: Pt made aware: Pt notified that once the labs are available and Dr. Leone Payor reviews the results then we will reach out to the pt:

## 2022-07-15 LAB — HEPATITIS B SURFACE ANTIGEN: Hepatitis B Surface Ag: NONREACTIVE

## 2022-07-15 LAB — HEPATITIS A ANTIBODY, TOTAL: Hepatitis A AB,Total: REACTIVE — AB

## 2022-07-15 LAB — ALPHA-1 ANTITRYPSIN PHENOTYPE: A-1 Antitrypsin, Ser: 91 mg/dL (ref 83–199)

## 2022-07-15 LAB — AFP TUMOR MARKER: AFP-Tumor Marker: 4 ng/mL (ref <6.1)

## 2022-07-15 LAB — HEPATITIS B SURFACE ANTIBODY,QUALITATIVE: Hep B S Ab: NONREACTIVE

## 2022-07-15 LAB — HEPATITIS C ANTIBODY: Hepatitis C Ab: NONREACTIVE

## 2022-07-16 ENCOUNTER — Encounter: Payer: Self-pay | Admitting: Internal Medicine

## 2022-07-16 DIAGNOSIS — R590 Localized enlarged lymph nodes: Secondary | ICD-10-CM | POA: Insufficient documentation

## 2022-07-17 ENCOUNTER — Emergency Department (HOSPITAL_BASED_OUTPATIENT_CLINIC_OR_DEPARTMENT_OTHER): Payer: Medicare Other

## 2022-07-17 ENCOUNTER — Encounter (HOSPITAL_COMMUNITY): Payer: Self-pay

## 2022-07-17 ENCOUNTER — Other Ambulatory Visit: Payer: Self-pay

## 2022-07-17 ENCOUNTER — Encounter (HOSPITAL_BASED_OUTPATIENT_CLINIC_OR_DEPARTMENT_OTHER): Payer: Self-pay

## 2022-07-17 ENCOUNTER — Inpatient Hospital Stay (HOSPITAL_BASED_OUTPATIENT_CLINIC_OR_DEPARTMENT_OTHER)
Admission: EM | Admit: 2022-07-17 | Discharge: 2022-07-19 | DRG: 281 | Disposition: A | Discharge status: Home / Self Care | Payer: Medicare Other | Attending: Interventional Cardiology | Admitting: Interventional Cardiology

## 2022-07-17 DIAGNOSIS — I214 Non-ST elevation (NSTEMI) myocardial infarction: Principal | ICD-10-CM

## 2022-07-17 DIAGNOSIS — E785 Hyperlipidemia, unspecified: Secondary | ICD-10-CM | POA: Diagnosis present

## 2022-07-17 DIAGNOSIS — Z955 Presence of coronary angioplasty implant and graft: Secondary | ICD-10-CM

## 2022-07-17 DIAGNOSIS — E1169 Type 2 diabetes mellitus with other specified complication: Secondary | ICD-10-CM | POA: Diagnosis present

## 2022-07-17 DIAGNOSIS — Z79899 Other long term (current) drug therapy: Secondary | ICD-10-CM

## 2022-07-17 DIAGNOSIS — Z8249 Family history of ischemic heart disease and other diseases of the circulatory system: Secondary | ICD-10-CM

## 2022-07-17 DIAGNOSIS — Z8261 Family history of arthritis: Secondary | ICD-10-CM

## 2022-07-17 DIAGNOSIS — K7460 Unspecified cirrhosis of liver: Secondary | ICD-10-CM | POA: Diagnosis present

## 2022-07-17 DIAGNOSIS — Y713 Surgical instruments, materials and cardiovascular devices (including sutures) associated with adverse incidents: Secondary | ICD-10-CM | POA: Diagnosis not present

## 2022-07-17 DIAGNOSIS — I5020 Unspecified systolic (congestive) heart failure: Secondary | ICD-10-CM | POA: Insufficient documentation

## 2022-07-17 DIAGNOSIS — Z818 Family history of other mental and behavioral disorders: Secondary | ICD-10-CM

## 2022-07-17 DIAGNOSIS — I2510 Atherosclerotic heart disease of native coronary artery without angina pectoris: Secondary | ICD-10-CM

## 2022-07-17 DIAGNOSIS — I10 Essential (primary) hypertension: Secondary | ICD-10-CM

## 2022-07-17 DIAGNOSIS — I97630 Postprocedural hematoma of a circulatory system organ or structure following a cardiac catheterization: Secondary | ICD-10-CM | POA: Diagnosis not present

## 2022-07-17 DIAGNOSIS — Z6828 Body mass index (BMI) 28.0-28.9, adult: Secondary | ICD-10-CM

## 2022-07-17 DIAGNOSIS — Y840 Cardiac catheterization as the cause of abnormal reaction of the patient, or of later complication, without mention of misadventure at the time of the procedure: Secondary | ICD-10-CM | POA: Diagnosis not present

## 2022-07-17 DIAGNOSIS — R079 Chest pain, unspecified: Principal | ICD-10-CM

## 2022-07-17 DIAGNOSIS — R0789 Other chest pain: Secondary | ICD-10-CM | POA: Diagnosis not present

## 2022-07-17 DIAGNOSIS — I110 Hypertensive heart disease with heart failure: Secondary | ICD-10-CM | POA: Diagnosis present

## 2022-07-17 DIAGNOSIS — E669 Obesity, unspecified: Secondary | ICD-10-CM | POA: Diagnosis present

## 2022-07-17 DIAGNOSIS — E119 Type 2 diabetes mellitus without complications: Secondary | ICD-10-CM | POA: Diagnosis present

## 2022-07-17 DIAGNOSIS — I252 Old myocardial infarction: Secondary | ICD-10-CM

## 2022-07-17 DIAGNOSIS — Z952 Presence of prosthetic heart valve: Secondary | ICD-10-CM | POA: Diagnosis not present

## 2022-07-17 DIAGNOSIS — F32A Depression, unspecified: Secondary | ICD-10-CM | POA: Diagnosis present

## 2022-07-17 DIAGNOSIS — Z7984 Long term (current) use of oral hypoglycemic drugs: Secondary | ICD-10-CM

## 2022-07-17 DIAGNOSIS — I5022 Chronic systolic (congestive) heart failure: Secondary | ICD-10-CM | POA: Diagnosis not present

## 2022-07-17 DIAGNOSIS — Z91030 Bee allergy status: Secondary | ICD-10-CM

## 2022-07-17 DIAGNOSIS — I255 Ischemic cardiomyopathy: Secondary | ICD-10-CM | POA: Diagnosis present

## 2022-07-17 DIAGNOSIS — K219 Gastro-esophageal reflux disease without esophagitis: Secondary | ICD-10-CM | POA: Diagnosis present

## 2022-07-17 DIAGNOSIS — Z87891 Personal history of nicotine dependence: Secondary | ICD-10-CM

## 2022-07-17 DIAGNOSIS — Z833 Family history of diabetes mellitus: Secondary | ICD-10-CM

## 2022-07-17 DIAGNOSIS — I25110 Atherosclerotic heart disease of native coronary artery with unstable angina pectoris: Secondary | ICD-10-CM | POA: Diagnosis present

## 2022-07-17 LAB — CBC
HCT: 41.9 % (ref 39.0–52.0)
Hemoglobin: 14.3 g/dL (ref 13.0–17.0)
MCH: 31.2 pg (ref 26.0–34.0)
MCHC: 34.1 g/dL (ref 30.0–36.0)
MCV: 91.3 fL (ref 80.0–100.0)
Platelets: 259 10*3/uL (ref 150–400)
RBC: 4.59 MIL/uL (ref 4.22–5.81)
RDW: 12.8 % (ref 11.5–15.5)
WBC: 8.1 10*3/uL (ref 4.0–10.5)
nRBC: 0 % (ref 0.0–0.2)

## 2022-07-17 LAB — BASIC METABOLIC PANEL
Anion gap: 7 (ref 5–15)
BUN: 13 mg/dL (ref 8–23)
CO2: 25 mmol/L (ref 22–32)
Calcium: 9.1 mg/dL (ref 8.9–10.3)
Chloride: 103 mmol/L (ref 98–111)
Creatinine, Ser: 0.92 mg/dL (ref 0.61–1.24)
GFR, Estimated: >60 mL/min (ref >60)
Glucose, Bld: 150 mg/dL — ABNORMAL HIGH (ref 70–99)
Potassium: 4.2 mmol/L (ref 3.5–5.1)
Sodium: 135 mmol/L (ref 135–145)

## 2022-07-17 LAB — TROPONIN I (HIGH SENSITIVITY)
Troponin I (High Sensitivity): 4 ng/L (ref <18)
Troponin I (High Sensitivity): 8 ng/L (ref <18)
Troponin I (High Sensitivity): 50 ng/L — ABNORMAL HIGH (ref <18)

## 2022-07-17 MED ORDER — ALUM & MAG HYDROXIDE-SIMETH 200-200-20 MG/5ML PO SUSP
30.0000 mL | Freq: Once | ORAL | Status: AC
  Administered 2022-07-17: 30 mL via ORAL
  Filled 2022-07-17: qty 30

## 2022-07-17 MED ORDER — ACETAMINOPHEN 500 MG PO TABS
1000.0000 mg | ORAL_TABLET | Freq: Once | ORAL | Status: AC
  Administered 2022-07-17: 1000 mg via ORAL
  Filled 2022-07-17: qty 2

## 2022-07-17 MED ORDER — ASPIRIN 81 MG PO CHEW
324.0000 mg | CHEWABLE_TABLET | Freq: Once | ORAL | Status: AC
  Administered 2022-07-17: 324 mg via ORAL
  Filled 2022-07-17: qty 4

## 2022-07-17 MED ORDER — FAMOTIDINE 20 MG PO TABS
20.0000 mg | ORAL_TABLET | Freq: Once | ORAL | Status: AC
  Administered 2022-07-17: 20 mg via ORAL
  Filled 2022-07-17: qty 1

## 2022-07-17 MED ORDER — HEPARIN BOLUS VIA INFUSION
4000.0000 [IU] | Freq: Once | INTRAVENOUS | Status: AC
  Administered 2022-07-17: 4000 [IU] via INTRAVENOUS

## 2022-07-17 MED ORDER — IOHEXOL 350 MG/ML SOLN
100.0000 mL | Freq: Once | INTRAVENOUS | Status: AC | PRN
  Administered 2022-07-17: 100 mL via INTRAVENOUS

## 2022-07-17 MED ORDER — HEPARIN (PORCINE) 25000 UT/250ML-% IV SOLN
1200.0000 [IU]/h | INTRAVENOUS | Status: DC
  Administered 2022-07-17 – 2022-07-18 (×2): 1200 [IU]/h via INTRAVENOUS
  Filled 2022-07-17 (×2): qty 250

## 2022-07-17 NOTE — Progress Notes (Signed)
ANTICOAGULATION CONSULT NOTE - Initial Consult  Pharmacy Consult for heparin  Indication: chest pain/ACS  Allergies  Allergen Reactions   Bee Venom Anaphylaxis    Patient Measurements: Height: 6\' 2"  (188 cm) Weight: 102.1 kg (225 lb) IBW/kg (Calculated) : 82.2 Heparin Dosing Weight: 102.1kg   Vital Signs: Temp: 98.5 F (36.9 C) (12/18 2130) Temp Source: Oral (12/18 2130) BP: 136/71 (12/18 2130) Pulse Rate: 55 (12/18 2130)  Labs: Recent Labs    07/17/22 1657 07/17/22 1857 07/17/22 2058  HGB 14.3  --   --   HCT 41.9  --   --   PLT 259  --   --   CREATININE 0.92  --   --   TROPONINIHS 4 8 50*    Estimated Creatinine Clearance: 102.1 mL/min (by C-G formula based on SCr of 0.92 mg/dL).   Medical History: Past Medical History:  Diagnosis Date   Anterior myocardial infarction (HCC) 11/2008   CAD (coronary artery disease)    remote anterior MI with VT and DES to the LAD in May of 2010; NSTEMI July 2014 in August 2014 with cath showing stent patency, EF of 35 to 40% and possible vasospasm versus potential small vessel disease.    Cardiomyopathy, ischemic 05/07/2013   Chronic ischemic heart disease 04/06/2013   Formatting of this note might be different from the original. STORY: 10/2008 and hospitalized 01/2013 with positive troponins but widely patent arteries on cath. Formatting of this note might be different from the original. STORY: 10/2008 and hospitalized 01/2013 with positive troponins but widely patent arteries on cath. Formatting of this note might be different from the original. STORY: 10/2008 and    Chronic systolic heart failure (HCC)    Depression    Former smoker 12/12/2017   GERD (gastroesophageal reflux disease) 4 years   H/O heart artery stent 05/20/2019   Hx of cardiovascular stress test 2015   Lexiscan Myoview (11/2013):  Anteroseptal, apical anterior, apex, apical inferior scar; no ischemia, EF 50%; Intermediate Risk   Hyperlipidemia    Hypertension     Ischemic cardiomyopathy    a.Echo (11/2013):  EF 45-50%, apical HK   MI (myocardial infarction) (HCC) ? 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 Trop elevation to 50. CBC stable, no anticoagulation PTA. Pharmacy consulted to dose heparin.   Goal of Therapy:  Heparin level 0.3-0.7 units/ml Monitor platelets by anticoagulation protocol: Yes   Plan:  Give 4000 units bolus x 1 Start heparin infusion at 1200 units/hr Check anti-Xa level in 6 hours and daily while on heparin Continue to monitor H&H and platelets  2011, PharmD, BCCCP  07/17/2022,10:00 PM

## 2022-07-17 NOTE — ED Provider Notes (Signed)
[facility] EMERGENCY DEPARTMENT Provider Note   CSN: [id number] Arrival date & time: 07/17/22  1620     History Chief Complaint  Patient presents with   Chest Pain    HPI Martin Frank is a 66 y.o. male presenting for chest pain.  He states that he has had episodic chest pain over the last day.  Took 2 nitro earlier today with some improvement of his symptoms.  His left-sided radiating to his left shoulder.  He does endorse some radiation to his back.  He feels that the nitroglycerin helped. He denies fevers or chills, nausea vomiting, syncope or shortness of breath..   Patient's recorded medical, surgical, social, medication list and allergies were reviewed in the Snapshot window as part of the initial history.   Review of Systems   Review of Systems  Constitutional:  Negative for chills and fever.  HENT:  Negative for ear pain and sore throat.   Eyes:  Negative for pain and visual disturbance.  Respiratory:  Negative for cough and shortness of breath.   Cardiovascular:  Positive for chest pain. Negative for palpitations.  Gastrointestinal:  Negative for abdominal pain and vomiting.  Genitourinary:  Negative for dysuria and hematuria.  Musculoskeletal:  Negative for arthralgias and back pain.  Skin:  Negative for color change and rash.  Neurological:  Negative for seizures and syncope.  All other systems reviewed and are negative.   Physical Exam Updated Vital Signs BP 136/71 (BP Location: Right Arm)   Pulse (!) 55   Temp 98.5 F (36.9 C) (Oral)   Resp 15   Ht 6\' 2"  (1.88 m)   Wt 102.1 kg   SpO2 100%   BMI 28.89 kg/m  Physical Exam Vitals and nursing note reviewed.  Constitutional:      General: He is not in acute distress.    Appearance: He is well-developed.  HENT:     Head: Normocephalic and atraumatic.  Eyes:     Conjunctiva/sclera: Conjunctivae normal.  Cardiovascular:     Rate and Rhythm: Normal rate and regular rhythm.     Heart sounds:  No murmur heard. Pulmonary:     Effort: Pulmonary effort is normal. No respiratory distress.     Breath sounds: Normal breath sounds.  Abdominal:     Palpations: Abdomen is soft.     Tenderness: There is no abdominal tenderness.  Musculoskeletal:        General: No swelling.     Cervical back: Neck supple.  Skin:    General: Skin is warm and dry.     Capillary Refill: Capillary refill takes less than 2 seconds.  Neurological:     Mental Status: He is alert.  Psychiatric:        Mood and Affect: Mood normal.      ED Course/ Medical Decision Making/ A&P    Procedures .Critical Care  Performed by: Glyn Ade, MD Authorized by: Glyn Ade, MD   Critical care provider statement:    Critical care time (minutes):  30   Critical care was necessary to treat or prevent imminent or life-threatening deterioration of the following conditions:  Cardiac failure   Critical care was time spent personally by me on the following activities:  Development of treatment plan with patient or surrogate, discussions with consultants, evaluation of patient's response to treatment, examination of patient, ordering and review of laboratory studies, ordering and review of radiographic studies, ordering and performing treatments and interventions, pulse oximetry, re-evaluation of patient's  condition and review of old charts    Medications Ordered in ED Medications  heparin ADULT infusion 100 units/mL (25000 units/218mL) (1,200 Units/hr Intravenous New Bag/Given 07/17/22 2231)  alum & mag hydroxide-simeth (MAALOX/MYLANTA) 200-200-20 MG/5ML suspension 30 mL (30 mLs Oral Given 07/17/22 1805)  famotidine (PEPCID) tablet 20 mg (20 mg Oral Given 07/17/22 1806)  acetaminophen (TYLENOL) tablet 1,000 mg (1,000 mg Oral Given 07/17/22 1807)  iohexol (OMNIPAQUE) 350 MG/ML injection 100 mL (100 mLs Intravenous Contrast Given 07/17/22 1842)  aspirin chewable tablet 324 mg (324 mg Oral Given 07/17/22 2227)   heparin bolus via infusion 4,000 Units (4,000 Units Intravenous Bolus from Bag 07/17/22 2228)   Medical Decision Making: Martin Frank is a 66 y.o. male who presented to the ED today with chest pain, detailed above.    Patient's presentation is complicated by their history of multiple comorbid medical problems.  Patient placed on continuous vitals and telemetry monitoring while in ED which was reviewed periodically.  Complete initial physical exam performed, notably the patient was HDS in NAD.   Reviewed and confirmed nursing documentation for past medical history, family history, social history.    Initial Assessment: With the patient's presentation of left-sided chest pain, most likely diagnosis is musculoskeletal chest pain versus GERD, although ACS remains on the differential. Other diagnoses were considered including (but not limited to) pulmonary embolism, community-acquired pneumonia, aortic dissection, pneumothorax, underlying bony abnormality, anemia. These are considered less likely due to history of present illness and physical exam findings.    Aortic Dissection also reconsidered but seems less likely based on the location, quality, onset, and severity of symptoms in this case. Patient also has a lack of underlying history of AD or TAA.  This is most consistent with an acute life/limb threatening illness complicated by underlying chronic conditions.   Initial Plan: Evaluate for ACS with deltatroponin and EKG evaluated as below  Evaluate for dissection, bony abnormality, or pneumonia with chest x-ray and screening laboratory evaluation including CBC, BMP  Further evaluation for pulmonary embolism not indicated at this time based on patient's PERC and Wells score.  Further evaluation for Thoracic Aortic Dissection  indicated at this time based on patient's clinical history and PE findings.   Initial Study Results: EKG was reviewed independently. Rate, rhythm, axis, intervals all  examined and without medically relevant abnormality. ST segments without concerns for elevations.    Laboratory  Delta troponin demonstratedrise 4-8 then to 50   CBC and BMP without obvious metabolic or inflammatory abnormalities requiring further evaluation   Radiology  CT Angio Chest Aorta W and/or Wo Contrast  Result Date: 07/17/2022 CLINICAL DATA:  Chest pain, dizziness, acute aortic syndrome suspected EXAM: CT ANGIOGRAPHY CHEST WITH CONTRAST TECHNIQUE: Multidetector CT imaging of the chest was performed using the standard protocol during bolus administration of intravenous contrast. Multiplanar CT image reconstructions and MIPs were obtained to evaluate the vascular anatomy. RADIATION DOSE REDUCTION: This exam was performed according to the departmental dose-optimization program which includes automated exposure control, adjustment of the mA and/or kV according to patient size and/or use of iterative reconstruction technique. CONTRAST:  OMNIPAQUE IOHEXOL 350 MG/ML SOLN COMPARISON:  None Available. FINDINGS: Cardiovascular: Unenhanced CT, there is no evidence of intramural hematoma. Following contrast administration, there is no evidence of thoracic aortic aneurysm or dissection. Atherosclerotic calcifications of the aortic arch. Although not tailored for evaluation of the pulmonary arteries, there is no evidence of pulmonary embolism to the lobar level. The heart is normal in size.  No pericardial effusion. Coronary atherosclerosis of the LAD. Mediastinum/Nodes: No suspicious mediastinal lymphadenopathy. Visualized thyroid is unremarkable. Lungs/Pleura: Evaluation lung parenchyma is constrained by respiratory motion. Within that constraint, there are no suspicious pulmonary nodules. Minimal bilateral lower lobe atelectasis. No focal consolidation. No pleural effusion or pneumothorax. Upper Abdomen: Visualized upper abdomen is notable for a mildly nodular hepatic contour, suggesting cirrhosis.  Status post cholecystectomy. Musculoskeletal: Visualized osseous structures are within normal limits. Review of the MIP images confirms the above findings. IMPRESSION: No evidence of thoracoabdominal aortic aneurysm or dissection. No evidence of pulmonary embolism. No evidence of acute cardiopulmonary disease. Aortic Atherosclerosis (ICD10-I70.0). Electronically Signed   By: Charline Bills M.D.   On: 07/17/2022 19:24   DG Chest 2 View  Result Date: 07/17/2022 CLINICAL DATA:  Chest pain. EXAM: CHEST - 2 VIEW COMPARISON:  May 08, 2021. FINDINGS: The heart size and mediastinal contours are within normal limits. Both lungs are clear. The visualized skeletal structures are unremarkable. IMPRESSION: No active cardiopulmonary disease. Electronically Signed   By: Lupita Raider M.D.   On: 07/17/2022 17:18   CT Abdomen Pelvis W Contrast  Result Date: 07/04/2022 CLINICAL DATA:  Hepatic cirrhosis. EXAM: CT ABDOMEN AND PELVIS WITH CONTRAST TECHNIQUE: Multidetector CT imaging of the abdomen and pelvis was performed using the standard protocol following bolus administration of intravenous contrast. RADIATION DOSE REDUCTION: This exam was performed according to the departmental dose-optimization program which includes automated exposure control, adjustment of the mA and/or kV according to patient size and/or use of iterative reconstruction technique. CONTRAST:  OMNIPAQUE IOHEXOL 300 MG/ML  SOLN COMPARISON:  Ultrasound May 23, 2022. FINDINGS: Lower chest: Incompletely visualized clustered nodularity in the right lower lobe on image 1/4. Band of subendocardial hypodensity with calcific foci along the left ventricular septum and apex compatible with prior infarction. Hepatobiliary: Cirrhotic hepatic morphology. No suspicious hepatic lesion. Gallbladder surgically absent. No biliary ductal dilation. Pancreas: Pancreatic ductal dilation or evidence of acute inflammation. Spleen: No splenomegaly.  Adrenals/Urinary Tract: Bilateral adrenal glands appear normal. No hydronephrosis. Kidneys demonstrate symmetric enhancement and excretion of contrast material. No solid enhancing renal mass. Stomach/Bowel: Stomach is nondistended limiting evaluation. No pathologic dilation of small or large bowel. Colonic diverticulosis without findings of acute diverticulitis. No evidence of acute bowel inflammation. Vascular/Lymphatic: Aortic atherosclerosis. Smooth IVC contours. The portal, splenic and superior mesenteric veins are patent. Portosystemic collateral vessels. Prominent upper abdominal lymph nodes for instance a gastrohepatic ligament lymph node measuring 8 mm in short axis on image 31/2 and a hepatoduodenal ligament lymph node measuring 7 mm in short axis on image 36/2 Reproductive: Prostate is unremarkable. Other: No significant abdominopelvic free fluid. Musculoskeletal: No acute osseous abnormality. Multilevel degenerative changes spine. IMPRESSION: 1. Cirrhotic hepatic morphology without suspicious hepatic lesion. 2. Incompletely visualized clustered nodularity in the right lower lobe, likely reflecting an infectious or inflammatory process consider further evaluation with dedicated chest CT. 3. Prominent upper abdominal lymph nodes are nonspecific but likely reactive. Attention on follow-up imaging suggested. 4. Colonic diverticulosis without findings of acute diverticulitis. 5. Band of subendocardial hypodensity with calcific foci along the left ventricular septum and apex compatible with prior infarction. 6.  Aortic Atherosclerosis (ICD10-I70.0). Electronically Signed   By: Maudry Mayhew M.D.   On: 07/04/2022 13:00    Final Assessment and Plan: Patient with active chest pain and rising troponin.  Started on heparinization and given aspirin.  Cardiology consulted and patient ultimately arranged for admission for further care and management.  Disposition:   Based  on the above findings, I believe this  patient is stable for admission.    Patient/family educated about specific findings on our evaluation and explained exact reasons for admission.  Patient/family educated about clinical situation and time was allowed to answer questions.   Admission team communicated with and agreed with need for admission. Patient admitted. Patient ready to move at this time.     Emergency Department Medication Summary:   Medications  heparin ADULT infusion 100 units/mL (25000 units/280mL) (1,200 Units/hr Intravenous New Bag/Given 07/17/22 2231)  alum & mag hydroxide-simeth (MAALOX/MYLANTA) 200-200-20 MG/5ML suspension 30 mL (30 mLs Oral Given 07/17/22 1805)  famotidine (PEPCID) tablet 20 mg (20 mg Oral Given 07/17/22 1806)  acetaminophen (TYLENOL) tablet 1,000 mg (1,000 mg Oral Given 07/17/22 1807)  iohexol (OMNIPAQUE) 350 MG/ML injection 100 mL (100 mLs Intravenous Contrast Given 07/17/22 1842)  aspirin chewable tablet 324 mg (324 mg Oral Given 07/17/22 2227)  heparin bolus via infusion 4,000 Units (4,000 Units Intravenous Bolus from Bag 07/17/22 2228)       Clinical Impression:  1. Chest pain, unspecified type   2. NSTEMI (non-ST elevated myocardial infarction) (HCC)      Admit   Final Clinical Impression(s) / ED Diagnoses Final diagnoses:  Chest pain, unspecified type  NSTEMI (non-ST elevated myocardial infarction) [facility])    Rx / DC Orders ED Discharge Orders     None         Glyn Ade, MD 07/17/22 2258

## 2022-07-17 NOTE — ED Notes (Signed)
Patient transported to CT 

## 2022-07-17 NOTE — ED Triage Notes (Signed)
C/o midsternal chest pain with dizziness that started while walking in the pharmacy, states now radiating down left arm. Took 2 nitroglycerins without relief. 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

## 2022-07-18 DIAGNOSIS — Z6828 Body mass index (BMI) 28.0-28.9, adult: Secondary | ICD-10-CM | POA: Diagnosis not present

## 2022-07-18 DIAGNOSIS — E119 Type 2 diabetes mellitus without complications: Secondary | ICD-10-CM | POA: Diagnosis present

## 2022-07-18 DIAGNOSIS — K219 Gastro-esophageal reflux disease without esophagitis: Secondary | ICD-10-CM | POA: Diagnosis present

## 2022-07-18 DIAGNOSIS — Z818 Family history of other mental and behavioral disorders: Secondary | ICD-10-CM | POA: Diagnosis not present

## 2022-07-18 DIAGNOSIS — I110 Hypertensive heart disease with heart failure: Secondary | ICD-10-CM | POA: Diagnosis present

## 2022-07-18 DIAGNOSIS — Z79899 Other long term (current) drug therapy: Secondary | ICD-10-CM | POA: Diagnosis not present

## 2022-07-18 DIAGNOSIS — E669 Obesity, unspecified: Secondary | ICD-10-CM | POA: Diagnosis present

## 2022-07-18 DIAGNOSIS — E785 Hyperlipidemia, unspecified: Secondary | ICD-10-CM | POA: Diagnosis present

## 2022-07-18 DIAGNOSIS — Y713 Surgical instruments, materials and cardiovascular devices (including sutures) associated with adverse incidents: Secondary | ICD-10-CM | POA: Diagnosis not present

## 2022-07-18 DIAGNOSIS — I214 Non-ST elevation (NSTEMI) myocardial infarction: Secondary | ICD-10-CM

## 2022-07-18 DIAGNOSIS — I97630 Postprocedural hematoma of a circulatory system organ or structure following a cardiac catheterization: Secondary | ICD-10-CM | POA: Diagnosis not present

## 2022-07-18 DIAGNOSIS — R001 Bradycardia, unspecified: Secondary | ICD-10-CM | POA: Diagnosis not present

## 2022-07-18 DIAGNOSIS — Y840 Cardiac catheterization as the cause of abnormal reaction of the patient, or of later complication, without mention of misadventure at the time of the procedure: Secondary | ICD-10-CM | POA: Diagnosis not present

## 2022-07-18 DIAGNOSIS — Z87891 Personal history of nicotine dependence: Secondary | ICD-10-CM | POA: Diagnosis not present

## 2022-07-18 DIAGNOSIS — K7460 Unspecified cirrhosis of liver: Secondary | ICD-10-CM | POA: Diagnosis present

## 2022-07-18 DIAGNOSIS — Z8261 Family history of arthritis: Secondary | ICD-10-CM | POA: Diagnosis not present

## 2022-07-18 DIAGNOSIS — Z8249 Family history of ischemic heart disease and other diseases of the circulatory system: Secondary | ICD-10-CM | POA: Diagnosis not present

## 2022-07-18 DIAGNOSIS — I5020 Unspecified systolic (congestive) heart failure: Secondary | ICD-10-CM | POA: Diagnosis not present

## 2022-07-18 DIAGNOSIS — Z952 Presence of prosthetic heart valve: Secondary | ICD-10-CM | POA: Diagnosis not present

## 2022-07-18 DIAGNOSIS — I25110 Atherosclerotic heart disease of native coronary artery with unstable angina pectoris: Secondary | ICD-10-CM | POA: Diagnosis present

## 2022-07-18 DIAGNOSIS — I2510 Atherosclerotic heart disease of native coronary artery without angina pectoris: Secondary | ICD-10-CM | POA: Diagnosis not present

## 2022-07-18 DIAGNOSIS — Z955 Presence of coronary angioplasty implant and graft: Secondary | ICD-10-CM | POA: Diagnosis not present

## 2022-07-18 DIAGNOSIS — Z833 Family history of diabetes mellitus: Secondary | ICD-10-CM | POA: Diagnosis not present

## 2022-07-18 DIAGNOSIS — I252 Old myocardial infarction: Secondary | ICD-10-CM | POA: Diagnosis not present

## 2022-07-18 DIAGNOSIS — I5022 Chronic systolic (congestive) heart failure: Secondary | ICD-10-CM | POA: Diagnosis present

## 2022-07-18 DIAGNOSIS — F32A Depression, unspecified: Secondary | ICD-10-CM | POA: Diagnosis present

## 2022-07-18 DIAGNOSIS — R7989 Other specified abnormal findings of blood chemistry: Secondary | ICD-10-CM | POA: Diagnosis not present

## 2022-07-18 DIAGNOSIS — I255 Ischemic cardiomyopathy: Secondary | ICD-10-CM | POA: Diagnosis present

## 2022-07-18 DIAGNOSIS — Z7984 Long term (current) use of oral hypoglycemic drugs: Secondary | ICD-10-CM | POA: Diagnosis not present

## 2022-07-18 DIAGNOSIS — Z91030 Bee allergy status: Secondary | ICD-10-CM | POA: Diagnosis not present

## 2022-07-18 DIAGNOSIS — R079 Chest pain, unspecified: Secondary | ICD-10-CM | POA: Diagnosis not present

## 2022-07-18 LAB — CBG MONITORING, ED: Glucose-Capillary: 115 mg/dL — ABNORMAL HIGH (ref 70–99)

## 2022-07-18 LAB — HEPARIN LEVEL (UNFRACTIONATED)
Heparin Unfractionated: 0.39 IU/mL (ref 0.30–0.70)
Heparin Unfractionated: 0.44 IU/mL (ref 0.30–0.70)

## 2022-07-18 LAB — GLUCOSE, CAPILLARY: Glucose-Capillary: 107 mg/dL — ABNORMAL HIGH (ref 70–99)

## 2022-07-18 LAB — TROPONIN I (HIGH SENSITIVITY): Troponin I (High Sensitivity): 315 ng/L (ref <18)

## 2022-07-18 MED ORDER — SODIUM CHLORIDE 0.9 % IV SOLN: 250.0000 mL | INTRAVENOUS | Status: DC | PRN

## 2022-07-18 MED ORDER — LISINOPRIL 5 MG PO TABS
2.5000 mg | ORAL_TABLET | Freq: Every day | ORAL | Status: DC
  Administered 2022-07-18: 2.5 mg via ORAL
  Filled 2022-07-18: qty 1

## 2022-07-18 MED ORDER — ACETAMINOPHEN 325 MG PO TABS: 650.0000 mg | ORAL_TABLET | ORAL | Status: DC | PRN

## 2022-07-18 MED ORDER — INSULIN ASPART 100 UNIT/ML IJ SOLN: 0.0000 [IU] | Freq: Three times a day (TID) | INTRAMUSCULAR | Status: DC

## 2022-07-18 MED ORDER — SODIUM CHLORIDE 0.9% FLUSH: 3.0000 mL | Freq: Two times a day (BID) | INTRAVENOUS | Status: DC

## 2022-07-18 MED ORDER — CITALOPRAM HYDROBROMIDE 20 MG PO TABS
40.0000 mg | ORAL_TABLET | Freq: Every day | ORAL | Status: DC
  Administered 2022-07-18 – 2022-07-19 (×2): 40 mg via ORAL
  Filled 2022-07-18: qty 1
  Filled 2022-07-18: qty 2

## 2022-07-18 MED ORDER — SODIUM CHLORIDE 0.9 % WEIGHT BASED INFUSION
3.0000 mL/kg/h | INTRAVENOUS | Status: AC
  Administered 2022-07-19: 3 mL/kg/h via INTRAVENOUS

## 2022-07-18 MED ORDER — METFORMIN HCL 500 MG PO TABS: 500.0000 mg | ORAL_TABLET | Freq: Two times a day (BID) | ORAL | Status: DC

## 2022-07-18 MED ORDER — BUSPIRONE HCL 5 MG PO TABS
10.0000 mg | ORAL_TABLET | Freq: Three times a day (TID) | ORAL | Status: DC
  Administered 2022-07-18 – 2022-07-19 (×4): 10 mg via ORAL
  Filled 2022-07-18 (×3): qty 2
  Filled 2022-07-18: qty 1

## 2022-07-18 MED ORDER — ONDANSETRON HCL 4 MG/2ML IJ SOLN: 4.0000 mg | Freq: Four times a day (QID) | INTRAMUSCULAR | Status: DC | PRN

## 2022-07-18 MED ORDER — PANTOPRAZOLE SODIUM 40 MG PO TBEC
40.0000 mg | DELAYED_RELEASE_TABLET | Freq: Every day | ORAL | Status: DC
  Administered 2022-07-18 – 2022-07-19 (×2): 40 mg via ORAL
  Filled 2022-07-18: qty 1

## 2022-07-18 MED ORDER — CARVEDILOL 6.25 MG PO TABS
6.2500 mg | ORAL_TABLET | Freq: Two times a day (BID) | ORAL | Status: DC
  Filled 2022-07-18: qty 1

## 2022-07-18 MED ORDER — ROSUVASTATIN CALCIUM 20 MG PO TABS
40.0000 mg | ORAL_TABLET | Freq: Every day | ORAL | Status: DC
  Administered 2022-07-19: 40 mg via ORAL
  Filled 2022-07-18: qty 2
  Filled 2022-07-18: qty 1

## 2022-07-18 MED ORDER — SODIUM CHLORIDE 0.9 % WEIGHT BASED INFUSION
1.0000 mL/kg/h | INTRAVENOUS | Status: DC
  Administered 2022-07-19: 1 mL/kg/h via INTRAVENOUS

## 2022-07-18 MED ORDER — NITROGLYCERIN 0.4 MG SL SUBL: 0.4000 mg | SUBLINGUAL_TABLET | SUBLINGUAL | Status: DC | PRN

## 2022-07-18 MED ORDER — SODIUM CHLORIDE 0.9% FLUSH: 3.0000 mL | INTRAVENOUS | Status: DC | PRN

## 2022-07-18 MED ORDER — ASPIRIN 81 MG PO TBEC
81.0000 mg | DELAYED_RELEASE_TABLET | Freq: Every day | ORAL | Status: DC
  Administered 2022-07-19: 81 mg via ORAL
  Filled 2022-07-18: qty 1

## 2022-07-18 NOTE — ED Notes (Signed)
Called lab to inquire about arrival of courier. Unable to tell me estimated time of arrival

## 2022-07-18 NOTE — ED Notes (Signed)
[facility] into transport 

## 2022-07-18 NOTE — H&P (Addendum)
Cardiology Admission History and Physical   Patient ID: [name] MRN: [id number]; DOB: 01-20-1956   Admission date: 07/17/2022  PCP:  Garnette Gunner, MD   [facility] Providers Cardiologist:  Olga Millers, MD     Chief Complaint:  Chest pain  Patient Profile:   Martin Frank is a 66 y.o. male with pancreatitis, HLD, CAD s/p stenting to LAD, DM and cirrhosis  who is being seen 07/18/2022 for the evaluation of chest pain/elevated troponin.  History of Present Illness:   Martin Frank is a 66 yo male with PMH noted above. 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 Had episode of vomiting after receiving plavix with subsequent ISR requiring a second intervention. Repeat cath 01/2013 with patent LAD, mild to moderate non-obstructive disease. LVEF 35-40%. Admitted to [facility] 03/2015 and underwent cardiac cath showing moderate PDA disease, small ramus branch with 70-80% ostial lesion, patent LAD stent with normal LV function. He was lost to follow up.  Presented to [facility] 11/2017 with chest pain concerning for unstable angina. Cardiac cath performed showed widely patent ostial to proximal LAD stent, mildly elevated LVEDP. Echo showed EF of 45-50%, mild LVH.   Last seen in the office on 12/2017 for follow up and doing well from cardiac standpoint. Continued on ASA, statin, coreg, lisinopril.   Presented to [facility] 12/18 with chest pain that started that evening. Centralized chest pressure with some epigastric pain. This felt quite similar to episodes he has had in the past leading to cardiac cath.   Labs in the ED showed Na+ 135, K+ 4.2, Cr 0.92, hsTn 4>>8>>50>>315, WBC 8.1, Hgb 14.3. EKG showed sinus bradycardia, TWI in inferolateral leads. CXR negative. CT angio was negative for PE. He was placed on IV heparin and transferred to [facility] for further management.   In talking with patient he reports having lots of GI issues recently, currently being  worked up with GI with US/CT scans. He was given maalox and pepcid at [facility] with improvement but follow up 3rd troponin was elevated.   He also reports a cath last year [facility], records showed cardiac cath 04/2021 with patent LAD stent, mild nonobstructive disease.    Past Medical History:  Diagnosis Date   Anterior myocardial infarction [facility]) 11/2008   CAD (coronary artery disease)    remote anterior MI with VT and DES to the LAD in May of 2010; NSTEMI July 2014 in [place] with cath showing stent patency, EF of 35 to 40% and possible vasospasm versus potential small vessel disease.    Cardiomyopathy, ischemic 05/07/2013   Chronic ischemic heart disease 04/06/2013   Formatting of this note might be different from the original. STORY: 10/2008 and hospitalized 01/2013 with positive troponins but widely patent arteries on cath. Formatting of this note might be different from the original. STORY: 10/2008 and hospitalized 01/2013 with positive troponins but widely patent arteries on cath. Formatting of this note might be different from the original. STORY: 10/2008 and    Chronic systolic heart failure (HCC)    Depression    Former smoker 12/12/2017   GERD (gastroesophageal reflux disease) 4 years   H/O heart artery stent 05/20/2019   Hx of cardiovascular stress test 2015   Lexiscan Myoview (11/2013):  Anteroseptal, apical anterior, apex, apical inferior scar; no ischemia, EF 50%; Intermediate Risk   Hyperlipidemia    Hypertension    Ischemic cardiomyopathy    a.Echo (11/2013):  EF 45-50%, apical HK   MI (myocardial infarction) (HCC) ? 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   Type II diabetes mellitus (HCC)     Past Surgical History:  Procedure Laterality Date   CARDIAC CATHETERIZATION  X2   CARDIAC VALVE REPLACEMENT     COLONOSCOPY     Multiple-last 2021 history of polyps   CORONARY ANGIOPLASTY  WITH STENT PLACEMENT  5/2010X 2   "1+1" (12/05/2017)   LAPAROSCOPIC CHOLECYSTECTOMY     LEFT HEART CATH AND CORONARY ANGIOGRAPHY N/A 12/06/2017   Procedure: LEFT HEART CATH AND CORONARY ANGIOGRAPHY;  Surgeon: Runell Gess, MD;  Location: [facility] INVASIVE CV LAB;  Service: Cardiovascular;  Laterality: N/A;   ORCHIECTOMY Right 1960s     Medications Prior to Admission: Prior to Admission medications   Medication Sig Start Date End Date Taking? Authorizing Provider  busPIRone (BUSPAR) 10 MG tablet Take 1 tablet (10 mg total) by mouth 3 (three) times daily. 06/01/22 08/30/22  Garnette Gunner, MD  carvedilol (COREG) 6.25 MG tablet Take 1 tablet (6.25 mg total) by mouth 2 (two) times daily with a meal. 12/13/21   Garnette Gunner, MD  citalopram (CELEXA) 40 MG tablet Take 1 tablet (40 mg total) by mouth daily. 06/01/22 08/30/22  Garnette Gunner, MD  lisinopril (ZESTRIL) 2.5 MG tablet TAKE 1 TABLET (2.5 MG TOTAL) BY MOUTH DAILY. PLEASE CONTACT OFFICE FOR ADDITIONAL REFILLS 06/16/22 01/12/23  Garnette Gunner, MD  metFORMIN (GLUCOPHAGE) 500 MG tablet Take 1 tablet (500 mg total) by mouth 2 (two) times daily with a meal. 01/24/22   Garnette Gunner, MD  nitroGLYCERIN (NITROSTAT) 0.4 MG SL tablet PLACE 1 TABLET (0.4 MG TOTAL) UNDER THE TONGUE EVERY 5 (FIVE) MINUTES AS NEEDED FOR CHEST PAIN (MAY REPEAT X 3 THAN CALL 911). 02/02/17   Crenshaw, Madolyn Frieze, MD  NON FORMULARY Milk thisel    [provider]  Omega-3 Fatty Acids (FISH OIL) 1000 MG CPDR Take by mouth.    [provider]  pantoprazole (PROTONIX) 40 MG tablet TAKE 1 TABLET BY MOUTH EVERY DAY 06/26/22   Garnette Gunner, MD  rosuvastatin (CRESTOR) 40 MG tablet Take 1 tablet (40 mg total) by mouth daily. 12/13/21   Garnette Gunner, MD     Allergies:    Allergies  Allergen Reactions   Bee Venom Anaphylaxis    Social History:   Social History   Socioeconomic History   Marital status: Married    Spouse name: Not on file    Number of children: 2   Years of education: Not on file   Highest education level: Not on file  Occupational History   Occupation: Teacher, adult education    Comment: Teacher, adult education   Occupation: Airline pilot  Tobacco Use   Smoking status: Former    Packs/day: 1.00    Years: 10.00    Total pack years: 10.00    Types: Cigarettes    Quit date: 07/31/1981    Years since quitting: 40.9   Smokeless tobacco: Never  Vaping Use   Vaping Use: Never used  Substance and Sexual Activity   Alcohol use: Not Currently    Alcohol/week: 3.0 standard drinks of alcohol    Types: 3 Glasses of wine per week   Drug use: Not Currently    Types: Marijuana    Comment: 12/05/2017 "nothing since my early 20's"  Sexual activity: Yes  Other Topics Concern   Not on file  Social History Narrative   Married, 43 years as of 2023   Angelique Blonder   Employed in water filter sales currently [employer]   Prior alcohol approximately 7 to 10/week maximum stopped in October 2020   5 caffeinated beverages daily   Former smoker no current tobacco or drug use   Social Determinants of Community education officer: Not on Ship broker Insecurity: Not on file  Transportation Needs: Not on file  Physical Activity: Not on file  Stress: Not on file  Social Connections: Not on file  Intimate Partner Violence: Not on file    Family History:   The patient's family history includes Atrial fibrillation in his son; CAD in his brother and father; Cancer (age of onset: 68) in his brother; Diabetes in his brother and mother; Post-traumatic stress disorder in his brother; Rheum arthritis in his mother; Suicidality in his brother. There is no history of Stomach cancer, Colon cancer, or Esophageal cancer.    ROS:  Please see the history of present illness.  All other ROS reviewed and negative.     Physical Exam/Data:   Vitals:   07/18/22 1430 07/18/22 1500 07/18/22 1530 07/18/22 1700  BP: 119/80 (!) 126/99 126/77 (!) 147/97  Pulse: (!) 55  (!) 56 (!) 48 (!) 52  Resp: 16 19 15 19   Temp:  98 F (36.7 C) 98.2 F (36.8 C) 98.3 F (36.8 C)  TempSrc:    Oral  SpO2: 94% 95% 97% 95%  Weight:      Height:        Intake/Output Summary (Last 24 hours) at 07/18/2022 1716 Last data filed at 07/18/2022 07/20/2022 Gross per 24 hour  Intake --  Output 1300 ml  Net -1300 ml      07/17/2022    4:38 PM 06/30/2022    9:50 AM 06/01/2022    2:06 PM  Last 3 Weights  Weight (lbs) 225 lb 226 lb 229 lb 6.4 oz  Weight (kg) 102.059 kg 102.513 kg 104.055 kg     Body mass index is 28.89 kg/m.  General:  Well nourished, well developed, in no acute distress HEENT: normal Neck: no JVD Vascular: No carotid bruits; Distal pulses 2+ bilaterally   Cardiac:  normal S1, S2; RRR; no murmur  Lungs:  clear to auscultation bilaterally, no wheezing, rhonchi or rales  Abd: soft, nontender, no hepatomegaly  Ext: no edema Musculoskeletal:  No deformities, BUE and BLE strength normal and equal Skin: warm and dry  Neuro:  CNs 2-12 intact, no focal abnormalities noted Psych:  Normal affect    EKG:  The ECG that was done 07/17/2022 was personally reviewed and demonstrates sinus bradycardia, TWI in inferolateral leads  Relevant CV Studies:  Cath: 11/2017  Previously placed Ost LAD to Prox LAD stent (unknown type) is widely patent. There is moderate left ventricular systolic dysfunction. LV end diastolic pressure is mildly elevated. The left ventricular ejection fraction is 45-50% by visual estimate.  Diagnostic Dominance: Right   Laboratory Data:  High Sensitivity Troponin:   Recent Labs  Lab 07/17/22 1657 07/17/22 1748 07/17/22 1857 07/17/22 2058  TROPONINIHS 4 315* 8 50*      Chemistry Recent Labs  Lab 07/17/22 1657  NA 135  K 4.2  CL 103  CO2 25  GLUCOSE 150*  BUN 13  CREATININE 0.92  CALCIUM 9.1  GFRNONAA >60  ANIONGAP 7    No  results for input(s): "PROT", "ALBUMIN", "AST", "ALT", "ALKPHOS", "BILITOT" in the last 168  hours. Lipids No results for input(s): "CHOL", "TRIG", "HDL", "LABVLDL", "LDLCALC", "CHOLHDL" in the last 168 hours. Hematology Recent Labs  Lab 07/17/22 1657  WBC 8.1  RBC 4.59  HGB 14.3  HCT 41.9  MCV 91.3  MCH 31.2  MCHC 34.1  RDW 12.8  PLT 259   Thyroid No results for input(s): "TSH", "FREET4" in the last 168 hours. BNPNo results for input(s): "BNP", "PROBNP" in the last 168 hours.  DDimer No results for input(s): "DDIMER" in the last 168 hours.   Radiology/Studies:  CT Angio Chest Aorta W and/or Wo Contrast  Result Date: 07/17/2022 CLINICAL DATA:  Chest pain, dizziness, acute aortic syndrome suspected EXAM: CT ANGIOGRAPHY CHEST WITH CONTRAST TECHNIQUE: Multidetector CT imaging of the chest was performed using the standard protocol during bolus administration of intravenous contrast. Multiplanar CT image reconstructions and MIPs were obtained to evaluate the vascular anatomy. RADIATION DOSE REDUCTION: This exam was performed according to the departmental dose-optimization program which includes automated exposure control, adjustment of the mA and/or kV according to patient size and/or use of iterative reconstruction technique. CONTRAST:  OMNIPAQUE IOHEXOL 350 MG/ML SOLN COMPARISON:  None Available. FINDINGS: Cardiovascular: Unenhanced CT, there is no evidence of intramural hematoma. Following contrast administration, there is no evidence of thoracic aortic aneurysm or dissection. Atherosclerotic calcifications of the aortic arch. Although not tailored for evaluation of the pulmonary arteries, there is no evidence of pulmonary embolism to the lobar level. The heart is normal in size.  No pericardial effusion. Coronary atherosclerosis of the LAD. Mediastinum/Nodes: No suspicious mediastinal lymphadenopathy. Visualized thyroid is unremarkable. Lungs/Pleura: Evaluation lung parenchyma is constrained by respiratory motion. Within that constraint, there are no suspicious pulmonary  nodules. Minimal bilateral lower lobe atelectasis. No focal consolidation. No pleural effusion or pneumothorax. Upper Abdomen: Visualized upper abdomen is notable for a mildly nodular hepatic contour, suggesting cirrhosis. Status post cholecystectomy. Musculoskeletal: Visualized osseous structures are within normal limits. Review of the MIP images confirms the above findings. IMPRESSION: No evidence of thoracoabdominal aortic aneurysm or dissection. No evidence of pulmonary embolism. No evidence of acute cardiopulmonary disease. Aortic Atherosclerosis (ICD10-I70.0). Electronically Signed   By: Charline Bills M.D.   On: 07/17/2022 19:24     Assessment and Plan:   Martin Frank is a 66 y.o. male with pancreatitis, HLD, CAD s/p stenting to LAD, DM and cirrhosis who is being seen 07/18/2022 for the evaluation of chest pain/elevated troponin.  NSTEMI CAD s/p stenting of LAD -- presented to the ED with chest pain. hsTn 4>>8>>50>>315. EKG with sinus bradycardia, TWI in inferolateral leads (which appears new).  -- on IV heparin which was started in the ED, no recurrent chest pain -- check echo -- continue ASA, statin, coreg  Shared Decision Making/Informed Consent The risks [stroke (1 in 1000), death (1 in 1000), kidney failure [usually temporary] (1 in 500), bleeding (1 in 200), allergic reaction [possibly serious] (1 in 200)], benefits (diagnostic support and management of coronary artery disease) and alternatives of a cardiac catheterization were discussed in detail with Mr. Eckels and he is willing to proceed.  HTN -- blood pressures stable -- continue coreg, hold lisinopril with plans for cardiac cath   HLD -- continue statin   DM -- hold metformin -- add SSI while inpatient  Recent dx of cirrhosis -- outpatient CT 12/5 with cirrhotic hepatic morphology without hepatic lesion, colonic diverticulosis without acute diverticulitis -- following with GI  outpatient    Risk Assessment/Risk  Scores:   TIMI Risk Score for Unstable Angina or Non-ST Elevation MI:   The patient's TIMI risk score is 5, which indicates a 26% risk of all cause mortality, new or recurrent myocardial infarction or need for urgent revascularization in the next 14 days.{  Severity of Illness: The appropriate patient status for this patient is INPATIENT. Inpatient status is judged to be reasonable and necessary in order to provide the required intensity of service to ensure the patient's safety. The patient's presenting symptoms, physical exam findings, and initial radiographic and laboratory data in the context of their chronic comorbidities is felt to place them at high risk for further clinical deterioration. Furthermore, it is not anticipated that the patient will be medically stable for discharge from the hospital within 2 midnights of admission.   * I certify that at the point of admission it is my clinical judgment that the patient will require inpatient hospital care spanning beyond 2 midnights from the point of admission due to high intensity of service, high risk for further deterioration and high frequency of surveillance required.*   For questions or updates, please contact [facility] Please consult [email or web address] for contact info under     Signed, Laverda Page, NP  07/18/2022 5:16 PM   I have examined the patient and reviewed assessment and plan and discussed with patient.  Agree with above as stated.   Had several hours of continuous chest pain, at least 5 hours between 3 PM and 8 PM yesterday.  Troponin elevation with an upward trend but mild peak.  Known CAD with prior LAD stent.  Plan for repeat catheterization to check for new CAD.  He has had clean caths in the past with this type of scenario.  He may have microvascular disease.  All questions about the cath have been answered.  Heart rate was somewhat slow.  Holding evening dose of carvedilol due to heart rate of 55.  If his  blood pressure and heart rate increase later today, could give his scheduled dose of carvedilol.    Lance Muss

## 2022-07-18 NOTE — ED Notes (Signed)
Pharmacist to notify [facility] pharmacy regarding change of time for Heparin level.

## 2022-07-18 NOTE — ED Notes (Signed)
Notified lab of heparin level due and to notify courier

## 2022-07-18 NOTE — Plan of Care (Signed)
  Problem: Education: Goal: Knowledge of General Education information will improve Description: Including pain rating scale, medication(s)/side effects and non-pharmacologic comfort measures Outcome: Progressing   Problem: Health Behavior/Discharge Planning: Goal: Ability to manage health-related needs will improve Outcome: Progressing   Problem: Clinical Measurements: Goal: Cardiovascular complication will be avoided Outcome: Progressing   Problem: Pain Managment: Goal: General experience of comfort will improve Outcome: Progressing   Problem: Safety: Goal: Ability to remain free from injury will improve Outcome: Progressing   Problem: Education: Goal: Understanding of CV disease, CV risk reduction, and recovery process will improve Outcome: Progressing   Problem: Education: Goal: Individualized Educational Video(s) Outcome: Progressing

## 2022-07-18 NOTE — ED Notes (Addendum)
Dr Tanna Savoy notified of elevated troponin. No changes in current plan of care. Awaiting bed assignment  and transport

## 2022-07-18 NOTE — Progress Notes (Signed)
ANTICOAGULATION CONSULT NOTE   Pharmacy Consult for heparin  Indication: chest pain/ACS  Allergies  Allergen Reactions   Bee Venom Anaphylaxis    Patient Measurements: Height: 6\' 2"  (188 cm) Weight: 102.1 kg (225 lb) IBW/kg (Calculated) : 82.2 Heparin Dosing Weight: 102.1kg   Vital Signs: Temp: 98.2 F (36.8 C) (12/19 0150) Temp Source: Oral (12/19 0150) BP: 107/71 (12/19 0600) Pulse Rate: 47 (12/19 0600)  Labs: Recent Labs    07/17/22 1657 07/17/22 1857 07/17/22 2058 07/18/22 0400  HGB 14.3  --   --   --   HCT 41.9  --   --   --   PLT 259  --   --   --   HEPARINUNFRC  --   --   --  0.39  CREATININE 0.92  --   --   --   TROPONINIHS 4 8 50*  --      Estimated Creatinine Clearance: 102.1 mL/min (by C-G formula based on SCr of 0.92 mg/dL).   Medical History: Past Medical History:  Diagnosis Date   Anterior myocardial infarction (HCC) 11/2008   CAD (coronary artery disease)    remote anterior MI with VT and DES to the LAD in May of 2010; NSTEMI July 2014 in August 2014 with cath showing stent patency, EF of 35 to 40% and possible vasospasm versus potential small vessel disease.    Cardiomyopathy, ischemic 05/07/2013   Chronic ischemic heart disease 04/06/2013   Formatting of this note might be different from the original. STORY: 10/2008 and hospitalized 01/2013 with positive troponins but widely patent arteries on cath. Formatting of this note might be different from the original. STORY: 10/2008 and hospitalized 01/2013 with positive troponins but widely patent arteries on cath. Formatting of this note might be different from the original. STORY: 10/2008 and    Chronic systolic heart failure (HCC)    Depression    Former smoker 12/12/2017   GERD (gastroesophageal reflux disease) 4 years   H/O heart artery stent 05/20/2019   Hx of cardiovascular stress test 2015   Lexiscan Myoview (11/2013):  Anteroseptal, apical anterior, apex, apical inferior scar; no ischemia, EF 50%;  Intermediate Risk   Hyperlipidemia    Hypertension    Ischemic cardiomyopathy    a.Echo (11/2013):  EF 45-50%, apical HK   MI (myocardial infarction) (HCC) ? 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 Trop elevation to 50. CBC stable, no anticoagulation PTA. Pharmacy consulted to dose heparin.   12/19 AM update:  Heparin level therapeutic   Goal of Therapy:  Heparin level 0.3-0.7 units/ml Monitor platelets by anticoagulation protocol: Yes   Plan:  Cont heparin 1200 units/hr 1400 heparin level  1/20, PharmD, BCPS Clinical Pharmacist Phone: [phone]

## 2022-07-18 NOTE — ED Notes (Signed)
ED TO INPATIENT HANDOFF REPORT  ED Nurse Name and Phone #:   Harley Hallmark RN  S Name/Age/Gender Martin Frank 66 y.o. male Room/Bed: MH02/MH02  Code Status   Code Status: Prior  Home/SNF/Other Home Patient oriented to: self, place, time, and situation Is this baseline? Yes   Triage Complete: Triage complete  Chief Complaint NSTEMI (non-ST elevated myocardial infarction) [facility] [I21.4]  Triage Note C/o midsternal chest pain with dizziness that started while walking in the pharmacy, states now radiating down left arm. Took 2 nitroglycerins without relief. 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   Allergies Allergies  Allergen Reactions   Bee Venom Anaphylaxis    Level of Care/Admitting Diagnosis ED Disposition     ED Disposition  Admit   Condition  --   Comment  Hospital Area: [facility] [[id number]]  Level of Care: Telemetry Cardiac [[id number]]  May admit patient to [facility] or [facility] if equivalent level of care is available:: No  Interfacility transfer: Yes  Covid Evaluation: Asymptomatic - no recent exposure (last 10 days) testing not required  Diagnosis: NSTEMI (non-ST elevated myocardial infarction) [facility]) [[id number]]  Admitting Physician: Lewayne Bunting [[id number]]  Attending Physician: Lewayne Bunting [[id number]]  Certification:: I certify this patient will need inpatient services for at least 2 midnights  Estimated Length of Stay: 3          B Medical/Surgery History Past Medical History:  Diagnosis Date   Anterior myocardial infarction (HCC) 11/2008   CAD (coronary artery disease)    remote anterior MI with VT and DES to the LAD in May of 2010; NSTEMI July 2014 in [place] with cath showing stent patency, EF of 35 to 40% and possible vasospasm versus potential small vessel disease.    Cardiomyopathy, ischemic 05/07/2013   Chronic ischemic heart disease 04/06/2013   Formatting of this note might be different from the original. STORY:  10/2008 and hospitalized 01/2013 with positive troponins but widely patent arteries on cath. Formatting of this note might be different from the original. STORY: 10/2008 and hospitalized 01/2013 with positive troponins but widely patent arteries on cath. Formatting of this note might be different from the original. STORY: 10/2008 and    Chronic systolic heart failure (HCC)    Depression    Former smoker 12/12/2017   GERD (gastroesophageal reflux disease) 4 years   H/O heart artery stent 05/20/2019   Hx of cardiovascular stress test 2015   Lexiscan Myoview (11/2013):  Anteroseptal, apical anterior, apex, apical inferior scar; no ischemia, EF 50%; Intermediate Risk   Hyperlipidemia    Hypertension    Ischemic cardiomyopathy    a.Echo (11/2013):  EF 45-50%, apical HK   MI (myocardial infarction) (HCC) ? 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   Type II diabetes mellitus (HCC)    Past Surgical History:  Procedure Laterality Date   CARDIAC CATHETERIZATION  X2   CARDIAC VALVE REPLACEMENT     COLONOSCOPY     Multiple-last 2021 history of polyps   CORONARY ANGIOPLASTY WITH STENT PLACEMENT  5/2010X 2   "1+1" (12/05/2017)   LAPAROSCOPIC CHOLECYSTECTOMY     LEFT HEART CATH AND CORONARY ANGIOGRAPHY N/A 12/06/2017   Procedure: LEFT HEART CATH AND CORONARY ANGIOGRAPHY;  Surgeon: Runell Gess, MD;  Location: [facility] INVASIVE CV LAB;  Service: Cardiovascular;  Laterality: N/A;  ORCHIECTOMY Right 1960s     A IV Location/Drains/Wounds Patient Lines/Drains/Airways Status     Active Line/Drains/Airways     Name Placement date Placement time Site Days   Peripheral IV 07/17/22 20 G Left Antecubital 07/17/22  1655  Antecubital  1            Intake/Output Last 24 hours  Intake/Output Summary (Last 24 hours) at 07/18/2022 1417 Last data filed at 07/18/2022 3244 Gross per 24 hour  Intake --  Output 1300 ml   Net -1300 ml    Labs/Imaging Results for orders placed or performed during the hospital encounter of 07/17/22 (from the past 48 hour(s))  Basic metabolic panel     Status: Abnormal   Collection Time: 07/17/22  4:57 PM  Result Value Ref Range   Sodium 135 135 - 145 mmol/L   Potassium 4.2 3.5 - 5.1 mmol/L   Chloride 103 98 - 111 mmol/L   CO2 25 22 - 32 mmol/L   Glucose, Bld 150 (H) 70 - 99 mg/dL    Comment: Glucose reference range applies only to samples taken after fasting for at least 8 hours.   BUN 13 8 - 23 mg/dL   Creatinine, Ser 0.10 0.61 - 1.24 mg/dL   Calcium 9.1 8.9 - 27.2 mg/dL   GFR, Estimated >53 >66 mL/min    Comment: (NOTE) Calculated using the CKD-EPI Creatinine Equation (2021)    Anion gap 7 5 - 15    Comment: Performed at [facility], [address] [place], [place] [address]  CBC     Status: None   Collection Time: 07/17/22  4:57 PM  Result Value Ref Range   WBC 8.1 4.0 - 10.5 K/uL   RBC 4.59 4.22 - 5.81 MIL/uL   Hemoglobin 14.3 13.0 - 17.0 g/dL   HCT 74.2 59.5 - 63.8 %   MCV 91.3 80.0 - 100.0 fL   MCH 31.2 26.0 - 34.0 pg   MCHC 34.1 30.0 - 36.0 g/dL   RDW 75.6 43.3 - 29.5 %   Platelets 259 150 - 400 K/uL   nRBC 0.0 0.0 - 0.2 %    Comment: Performed at [facility], [address] [place], [place] [address]  Troponin I (High Sensitivity)     Status: None   Collection Time: 07/17/22  4:57 PM  Result Value Ref Range   Troponin I (High Sensitivity) 4 <18 ng/L    Comment: (NOTE) Elevated high sensitivity troponin I (hsTnI) values and significant  changes across serial measurements may suggest ACS but many other  chronic and acute conditions are known to elevate hsTnI results.  Refer to the "Links" section for chest pain algorithms and additional  guidance. Performed at [facility], [address] [place], [place] [address]   Troponin I (High Sensitivity)     Status: Abnormal   Collection Time: 07/17/22  5:48 PM   Result Value Ref Range   Troponin I (High Sensitivity) 315 (HH) <18 ng/L    Comment: CRITICAL RESULT CALLED TO, READ BACK BY AND VERIFIED WITH MARVA SIMMS RN ON 07/18/22 1250 SRC (NOTE) Elevated high sensitivity troponin I (hsTnI) values and significant  changes across serial measurements may suggest ACS but many other  chronic and acute conditions are known to elevate hsTnI results.  Refer to the "Links" section for chest pain algorithms and additional  guidance. Performed at [facility], [address] [place], [place]  [address]   Troponin I (High Sensitivity)     Status: None   Collection Time: 07/17/22  6:57 PM  Result Value Ref Range   Troponin I (High Sensitivity) 8 <18 ng/L    Comment: (NOTE) Elevated high sensitivity troponin I (hsTnI) values and significant  changes across serial measurements may suggest ACS but many other  chronic and acute conditions are known to elevate hsTnI results.  Refer to the "Links" section for chest pain algorithms and additional  guidance. Performed at [facility], [address] [place], [place] [address]   Troponin I (High Sensitivity)     Status: Abnormal   Collection Time: 07/17/22  8:58 PM  Result Value Ref Range   Troponin I (High Sensitivity) 50 (H) <18 ng/L    Comment: DELTA CHECK NOTED RESULT CALLED TO, READ BACK BY AND VERIFIED WITH LISA M.RN AT 2142 ON 07/17/22 BY [email or web address] (NOTE) Elevated high sensitivity troponin I (hsTnI) values and significant  changes across serial measurements may suggest ACS but many other  chronic and acute conditions are known to elevate hsTnI results.  Refer to the "Links" section for chest pain algorithms and additional  guidance. Performed at [facility], [address] [place], [place] [address]   Heparin level (unfractionated)     Status: None   Collection Time: 07/18/22  4:00 AM  Result Value Ref Range   Heparin Unfractionated 0.39 0.30 - 0.70 IU/mL     Comment: (NOTE) The clinical reportable range upper limit is being lowered to >1.10 to align with the FDA approved guidance for the current laboratory assay.  If heparin results are below expected values, and patient dosage has  been confirmed, suggest follow up testing of antithrombin III levels. Performed at [facility], [place] [address] [place], [place] [address]   CBG monitoring, ED     Status: Abnormal   Collection Time: 07/18/22  8:55 AM  Result Value Ref Range   Glucose-Capillary 115 (H) 70 - 99 mg/dL    Comment: Glucose reference range applies only to samples taken after fasting for at least 8 hours.   CT Angio Chest Aorta W and/or Wo Contrast  Result Date: 07/17/2022 CLINICAL DATA:  Chest pain, dizziness, acute aortic syndrome suspected EXAM: CT ANGIOGRAPHY CHEST WITH CONTRAST TECHNIQUE: Multidetector CT imaging of the chest was performed using the standard protocol during bolus administration of intravenous contrast. Multiplanar CT image reconstructions and MIPs were obtained to evaluate the vascular anatomy. RADIATION DOSE REDUCTION: This exam was performed according to the departmental dose-optimization program which includes automated exposure control, adjustment of the mA and/or kV according to patient size and/or use of iterative reconstruction technique. CONTRAST:  170mL OMNIPAQUE IOHEXOL 350 MG/ML SOLN COMPARISON:  None Available. FINDINGS: Cardiovascular: Unenhanced CT, there is no evidence of intramural hematoma. Following contrast administration, there is no evidence of thoracic aortic aneurysm or dissection. Atherosclerotic calcifications of the aortic arch. Although not tailored for evaluation of the pulmonary arteries, there is no evidence of pulmonary embolism to the lobar level. The heart is normal in size.  No pericardial effusion. Coronary atherosclerosis of the LAD. Mediastinum/Nodes: No suspicious mediastinal lymphadenopathy. Visualized thyroid is unremarkable.  Lungs/Pleura: Evaluation lung parenchyma is constrained by respiratory motion. Within that constraint, there are no suspicious pulmonary nodules. Minimal bilateral lower lobe atelectasis. No focal consolidation. No pleural effusion or pneumothorax. Upper Abdomen: Visualized upper abdomen is notable for a mildly nodular hepatic contour, suggesting cirrhosis. Status post cholecystectomy.  Musculoskeletal: Visualized osseous structures are within normal limits. Review of the MIP images confirms the above findings. IMPRESSION: No evidence of thoracoabdominal aortic aneurysm or dissection. No evidence of pulmonary embolism. No evidence of acute cardiopulmonary disease. Aortic Atherosclerosis (ICD10-I70.0). Electronically Signed   By: Julian Hy M.D.   On: 07/17/2022 19:24   DG Chest 2 View  Result Date: 07/17/2022 CLINICAL DATA:  Chest pain. EXAM: CHEST - 2 VIEW COMPARISON:  May 08, 2021. FINDINGS: The heart size and mediastinal contours are within normal limits. Both lungs are clear. The visualized skeletal structures are unremarkable. IMPRESSION: No active cardiopulmonary disease. Electronically Signed   By: Marijo Conception M.D.   On: 07/17/2022 17:18    Pending Labs Unresulted Labs (From admission, onward)     Start     Ordered   07/18/22 1400  Heparin level (unfractionated)  Once-Timed,   URGENT        07/18/22 0631            Vitals/Pain Today's Vitals   07/18/22 1131 07/18/22 1300 07/18/22 1330 07/18/22 1400  BP:  (!) 141/82 123/81 119/76  Pulse:  (!) 58 (!) 56 (!) 55  Resp:  (!) 28 13 20   Temp:  98 F (36.7 C) 98 F (36.7 C)   TempSrc:      SpO2:  98% 93% 94%  Weight:      Height:      PainSc: 1        Isolation Precautions No active isolations  Medications Medications  heparin ADULT infusion 100 units/mL (25000 units/261mL) (1,200 Units/hr Intravenous New Bag/Given 07/17/22 2231)  busPIRone (BUSPAR) tablet 10 mg (10 mg Oral Not Given 07/18/22 1255)  carvedilol  (COREG) tablet 6.25 mg ( Oral Canceled Entry 07/18/22 0919)  citalopram (CELEXA) tablet 40 mg (40 mg Oral Not Given 07/18/22 1259)  lisinopril (ZESTRIL) tablet 2.5 mg (has no administration in time range)  pantoprazole (PROTONIX) EC tablet 40 mg (has no administration in time range)  rosuvastatin (CRESTOR) tablet 40 mg (40 mg Oral Not Given 07/18/22 1213)  metFORMIN (GLUCOPHAGE) tablet 500 mg (has no administration in time range)  alum & mag hydroxide-simeth (MAALOX/MYLANTA) 200-200-20 MG/5ML suspension 30 mL (30 mLs Oral Given 07/17/22 1805)  famotidine (PEPCID) tablet 20 mg (20 mg Oral Given 07/17/22 1806)  acetaminophen (TYLENOL) tablet 1,000 mg (1,000 mg Oral Given 07/17/22 1807)  iohexol (OMNIPAQUE) 350 MG/ML injection 100 mL (100 mLs Intravenous Contrast Given 07/17/22 1842)  aspirin chewable tablet 324 mg (324 mg Oral Given 07/17/22 2227)  heparin bolus via infusion 4,000 Units (4,000 Units Intravenous Bolus from Bag 07/17/22 2228)    Mobility walks Low fall risk   Focused Assessments Cardiac Assessment Handoff:  Cardiac Rhythm: Sinus bradycardia Lab Results  Component Value Date   TROPONINI <0.03 12/06/2017   No results found for: "DDIMER" Does the Patient currently have chest pain? No    R Recommendations: See Admitting Provider Note  Report given to:   Additional Notes:

## 2022-07-18 NOTE — ED Notes (Signed)
Report given to carelink 

## 2022-07-19 ENCOUNTER — Other Ambulatory Visit (HOSPITAL_COMMUNITY): Payer: Self-pay

## 2022-07-19 ENCOUNTER — Encounter (HOSPITAL_COMMUNITY)
Admission: EM | Disposition: A | Discharge status: Home / Self Care | Payer: Self-pay | Source: Home / Self Care | Attending: Interventional Cardiology

## 2022-07-19 ENCOUNTER — Inpatient Hospital Stay (HOSPITAL_COMMUNITY): Payer: Medicare Other

## 2022-07-19 ENCOUNTER — Encounter (HOSPITAL_COMMUNITY): Payer: Self-pay | Admitting: Cardiovascular Disease

## 2022-07-19 DIAGNOSIS — R7989 Other specified abnormal findings of blood chemistry: Secondary | ICD-10-CM

## 2022-07-19 DIAGNOSIS — R079 Chest pain, unspecified: Secondary | ICD-10-CM

## 2022-07-19 DIAGNOSIS — I5020 Unspecified systolic (congestive) heart failure: Secondary | ICD-10-CM

## 2022-07-19 DIAGNOSIS — R001 Bradycardia, unspecified: Secondary | ICD-10-CM

## 2022-07-19 DIAGNOSIS — I25110 Atherosclerotic heart disease of native coronary artery with unstable angina pectoris: Secondary | ICD-10-CM

## 2022-07-19 HISTORY — PX: CORONARY ANGIOGRAPHY: CATH118303

## 2022-07-19 LAB — ECHOCARDIOGRAM COMPLETE
AR max vel: 3.75 cm2
AV Area VTI: 3.61 cm2
AV Area mean vel: 3.61 cm2
AV Mean grad: 3 mmHg
AV Peak grad: 5 mmHg
Ao pk vel: 1.12 m/s
Area-P 1/2: 2.44 cm2
Calc EF: 39.8 %
Height: 74 in
Single Plane A2C EF: 45.5 %
Single Plane A4C EF: 37.3 %
Weight: 3424 oz

## 2022-07-19 LAB — BASIC METABOLIC PANEL
Anion gap: 11 (ref 5–15)
BUN: 11 mg/dL (ref 8–23)
CO2: 19 mmol/L — ABNORMAL LOW (ref 22–32)
Calcium: 8.8 mg/dL — ABNORMAL LOW (ref 8.9–10.3)
Chloride: 106 mmol/L (ref 98–111)
Creatinine, Ser: 0.76 mg/dL (ref 0.61–1.24)
GFR, Estimated: >60 mL/min (ref >60)
Glucose, Bld: 98 mg/dL (ref 70–99)
Potassium: 3.8 mmol/L (ref 3.5–5.1)
Sodium: 136 mmol/L (ref 135–145)

## 2022-07-19 LAB — CBC
HCT: 43.1 % (ref 39.0–52.0)
Hemoglobin: 14.6 g/dL (ref 13.0–17.0)
MCH: 31.1 pg (ref 26.0–34.0)
MCHC: 33.9 g/dL (ref 30.0–36.0)
MCV: 91.9 fL (ref 80.0–100.0)
Platelets: 195 10*3/uL (ref 150–400)
RBC: 4.69 MIL/uL (ref 4.22–5.81)
RDW: 12.5 % (ref 11.5–15.5)
WBC: 7.7 10*3/uL (ref 4.0–10.5)
nRBC: 0 % (ref 0.0–0.2)

## 2022-07-19 LAB — LIPID PANEL
Cholesterol: 183 mg/dL (ref 0–200)
HDL: 46 mg/dL (ref >40)
LDL Cholesterol: 116 mg/dL — ABNORMAL HIGH (ref 0–99)
Total CHOL/HDL Ratio: 4 RATIO
Triglycerides: 103 mg/dL (ref <150)
VLDL: 21 mg/dL (ref 0–40)

## 2022-07-19 LAB — GLUCOSE, CAPILLARY
Glucose-Capillary: 113 mg/dL — ABNORMAL HIGH (ref 70–99)
Glucose-Capillary: 107 mg/dL — ABNORMAL HIGH (ref 70–99)
Glucose-Capillary: 115 mg/dL — ABNORMAL HIGH (ref 70–99)
Glucose-Capillary: 194 mg/dL — ABNORMAL HIGH (ref 70–99)

## 2022-07-19 LAB — HIV ANTIBODY (ROUTINE TESTING W REFLEX): HIV Screen 4th Generation wRfx: NONREACTIVE

## 2022-07-19 LAB — HEPARIN LEVEL (UNFRACTIONATED): Heparin Unfractionated: 0.42 IU/mL (ref 0.30–0.70)

## 2022-07-19 SURGERY — CORONARY ANGIOGRAPHY (CATH LAB)
Anesthesia: LOCAL

## 2022-07-19 MED ORDER — EMPAGLIFLOZIN 10 MG PO TABS
10.0000 mg | ORAL_TABLET | Freq: Every day | ORAL | 2 refills | Status: DC
  Filled 2022-07-19: qty 30, 30d supply, fill #0

## 2022-07-19 MED ORDER — HEPARIN (PORCINE) IN NACL 1000-0.9 UT/500ML-% IV SOLN
INTRAVENOUS | Status: AC
  Filled 2022-07-19: qty 1000

## 2022-07-19 MED ORDER — SODIUM CHLORIDE 0.9 % IV SOLN: 250.0000 mL | INTRAVENOUS | Status: DC | PRN

## 2022-07-19 MED ORDER — HYDRALAZINE HCL 20 MG/ML IJ SOLN: 10.0000 mg | INTRAMUSCULAR | Status: AC | PRN

## 2022-07-19 MED ORDER — MIDAZOLAM HCL 2 MG/2ML IJ SOLN
INTRAMUSCULAR | Status: DC | PRN
  Administered 2022-07-19: 2 mg via INTRAVENOUS

## 2022-07-19 MED ORDER — LABETALOL HCL 5 MG/ML IV SOLN: 10.0000 mg | INTRAVENOUS | Status: AC | PRN

## 2022-07-19 MED ORDER — ASPIRIN 81 MG PO TBEC: 81.0000 mg | DELAYED_RELEASE_TABLET | Freq: Every day | ORAL | 1 refills | Status: AC

## 2022-07-19 MED ORDER — VERAPAMIL HCL 2.5 MG/ML IV SOLN
INTRAVENOUS | Status: DC | PRN
  Administered 2022-07-19: 10 mL via INTRA_ARTERIAL

## 2022-07-19 MED ORDER — CARVEDILOL 3.125 MG PO TABS: 3.1250 mg | ORAL_TABLET | Freq: Two times a day (BID) | ORAL | 0 refills | Status: DC

## 2022-07-19 MED ORDER — LIDOCAINE HCL (PF) 1 % IJ SOLN
INTRAMUSCULAR | Status: DC | PRN
  Administered 2022-07-19: 2 mL

## 2022-07-19 MED ORDER — FENTANYL CITRATE (PF) 100 MCG/2ML IJ SOLN
INTRAMUSCULAR | Status: DC | PRN
  Administered 2022-07-19: 25 ug via INTRAVENOUS

## 2022-07-19 MED ORDER — MIDAZOLAM HCL 2 MG/2ML IJ SOLN
INTRAMUSCULAR | Status: AC
  Filled 2022-07-19: qty 2

## 2022-07-19 MED ORDER — IOHEXOL 350 MG/ML SOLN
INTRAVENOUS | Status: DC | PRN
  Administered 2022-07-19: 40 mL

## 2022-07-19 MED ORDER — SODIUM CHLORIDE 0.9 % IV SOLN: INTRAVENOUS | Status: AC

## 2022-07-19 MED ORDER — SODIUM CHLORIDE 0.9% FLUSH: 3.0000 mL | INTRAVENOUS | Status: DC | PRN

## 2022-07-19 MED ORDER — SODIUM CHLORIDE 0.9% FLUSH: 3.0000 mL | Freq: Two times a day (BID) | INTRAVENOUS | Status: DC

## 2022-07-19 MED ORDER — HEPARIN (PORCINE) IN NACL 1000-0.9 UT/500ML-% IV SOLN
INTRAVENOUS | Status: DC | PRN
  Administered 2022-07-19 (×2): 500 mL

## 2022-07-19 MED ORDER — LIDOCAINE HCL (PF) 1 % IJ SOLN
INTRAMUSCULAR | Status: AC
  Filled 2022-07-19: qty 30

## 2022-07-19 MED ORDER — HEPARIN SODIUM (PORCINE) 1000 UNIT/ML IJ SOLN
INTRAMUSCULAR | Status: AC
  Filled 2022-07-19: qty 10

## 2022-07-19 MED ORDER — FENTANYL CITRATE (PF) 100 MCG/2ML IJ SOLN
INTRAMUSCULAR | Status: AC
  Filled 2022-07-19: qty 2

## 2022-07-19 MED ORDER — PERFLUTREN LIPID MICROSPHERE
1.0000 mL | INTRAVENOUS | Status: AC | PRN
  Administered 2022-07-19: 2 mL via INTRAVENOUS

## 2022-07-19 MED ORDER — HEPARIN SODIUM (PORCINE) 1000 UNIT/ML IJ SOLN
INTRAMUSCULAR | Status: DC | PRN
  Administered 2022-07-19: 5000 [IU] via INTRAVENOUS

## 2022-07-19 MED ORDER — EMPAGLIFLOZIN 10 MG PO TABS: 10.0000 mg | ORAL_TABLET | Freq: Every day | ORAL | Status: DC

## 2022-07-19 MED ORDER — VERAPAMIL HCL 2.5 MG/ML IV SOLN
INTRAVENOUS | Status: AC
  Filled 2022-07-19: qty 2

## 2022-07-19 SURGICAL SUPPLY — 10 items
CATH 5FR JL3.5 JR4 ANG PIG MP (CATHETERS) IMPLANT
DEVICE RAD COMP TR BAND LRG (VASCULAR PRODUCTS) IMPLANT
GLIDESHEATH SLEND SS 6F .021 (SHEATH) IMPLANT
GUIDEWIRE INQWIRE 1.5J.035X260 (WIRE) IMPLANT
INQWIRE 1.5J .035X260CM (WIRE) ×1
KIT HEART LEFT (KITS) ×1 IMPLANT
PACK CARDIAC CATHETERIZATION (CUSTOM PROCEDURE TRAY) ×1 IMPLANT
SHEATH PROBE COVER 6X72 (BAG) IMPLANT
TRANSDUCER W/STOPCOCK (MISCELLANEOUS) ×1 IMPLANT
TUBING CIL FLEX 10 FLL-RA (TUBING) ×1 IMPLANT

## 2022-07-19 NOTE — TOC Benefit Eligibility Note (Signed)
Patient Product/process development scientist completed.    The patient is currently admitted and upon discharge could be taking Entresto 24-26 mg.  The current 30 day co-pay is $288.75 due to a $144.45 deductible remaining.   The patient is currently admitted and upon discharge could be taking Farxiga 10 mg.  The current 30 day co-pay is $164.45 due to a $144.45 deductible remaining.   The patient is currently admitted and upon discharge could be taking Jardiance 10 mg.  The current 30 day co-pay is $164.45 due to a $144.45 deductible remaining.   The patient is insured through [employer] Part D [name] only [facility] pharmacy that takes his insurnace)   Martin Frank, CPHT Pharmacy Patient Advocate Specialist [facility] Patient Advocate Team Direct Number: [phone]  Fax: [phone]

## 2022-07-19 NOTE — Interval H&P Note (Signed)
History and Physical Interval Note:  07/19/2022 10:38 AM  Martin Frank  has presented today for surgery, with the diagnosis of chest pain.  The various methods of treatment have been discussed with the patient and family. After consideration of risks, benefits and other options for treatment, the patient has consented to  Procedure(s): LEFT HEART CATH AND CORONARY ANGIOGRAPHY (N/A) as a surgical intervention.  The patient's history has been reviewed, patient examined, no change in status, stable for surgery.  I have reviewed the patient's chart and labs.  Questions were answered to the patient's satisfaction.     Tonny Bollman

## 2022-07-19 NOTE — Progress Notes (Addendum)
Rounding Note    Patient Name: Martin Frank Date of Encounter: 07/19/2022  [facility] Cardiologist: Olga Millers, MD   Subjective   No chest pain this morning. Planned for cardiac cath.   Inpatient Medications    Scheduled Meds:  aspirin EC  81 mg Oral Daily   busPIRone  10 mg Oral TID   carvedilol  6.25 mg Oral BID WC   citalopram  40 mg Oral Daily   insulin aspart  0-15 Units Subcutaneous TID WC   pantoprazole  40 mg Oral Daily   rosuvastatin  40 mg Oral Daily   sodium chloride flush  3 mL Intravenous Q12H   Continuous Infusions:  sodium chloride     sodium chloride 1 mL/kg/hr (07/19/22 0602)   heparin 1,200 Units/hr (07/19/22 0000)   PRN Meds: sodium chloride, acetaminophen, nitroGLYCERIN, ondansetron (ZOFRAN) IV, sodium chloride flush   Vital Signs    Vitals:   07/18/22 1948 07/19/22 0024 07/19/22 0456 07/19/22 0506  BP: 108/68 125/72    Pulse: 65 (!) 52 (!) 56   Resp: 18 19 20    Temp: 98.5 F (36.9 C) 98.7 F (37.1 C) 97.8 F (36.6 C)   TempSrc: Oral Oral Oral   SpO2: 95% 96% 98%   Weight:    97.1 kg  Height:    6\' 2"  (1.88 m)    Intake/Output Summary (Last 24 hours) at 07/19/2022 0741 Last data filed at 07/19/2022 0000 Gross per 24 hour  Intake 545.8 ml  Output --  Net 545.8 ml      07/19/2022    5:06 AM 07/17/2022    4:38 PM 06/30/2022    9:50 AM  Last 3 Weights  Weight (lbs) 214 lb 225 lb 226 lb  Weight (kg) 97.07 kg 102.059 kg 102.513 kg      Telemetry    Sinus Bradycardia - Personally Reviewed  ECG    Sinus Bradycardia, 57 bpm - Personally Reviewed  Physical Exam   GEN: No acute distress.   Neck: No JVD Cardiac: RRR, no murmurs, rubs, or gallops.  Respiratory: Clear to auscultation bilaterally. GI: Soft, nontender, non-distended  MS: No edema; No deformity. Neuro:  Nonfocal  Psych: Normal affect   Labs    High Sensitivity Troponin:   Recent Labs  Lab 07/17/22 1657 07/17/22 1748 07/17/22 1857  07/17/22 2058  TROPONINIHS 4 315* 8 50*     Chemistry Recent Labs  Lab 07/17/22 1657 07/19/22 0203  NA 135 136  K 4.2 3.8  CL 103 106  CO2 25 19*  GLUCOSE 150* 98  BUN 13 11  CREATININE 0.92 0.76  CALCIUM 9.1 8.8*  GFRNONAA >60 >60  ANIONGAP 7 11    Lipids  Recent Labs  Lab 07/19/22 0203  CHOL 183  TRIG 103  HDL 46  LDLCALC 116*  CHOLHDL 4.0    Hematology Recent Labs  Lab 07/17/22 1657 07/19/22 0203  WBC 8.1 7.7  RBC 4.59 4.69  HGB 14.3 14.6  HCT 41.9 43.1  MCV 91.3 91.9  MCH 31.2 31.1  MCHC 34.1 33.9  RDW 12.8 12.5  PLT 259 195   Thyroid No results for input(s): "TSH", "FREET4" in the last 168 hours.  BNPNo results for input(s): "BNP", "PROBNP" in the last 168 hours.  DDimer No results for input(s): "DDIMER" in the last 168 hours.   Radiology    CT Angio Chest Aorta W and/or Wo Contrast  Result Date: 07/17/2022 CLINICAL DATA:  Chest pain, dizziness,  acute aortic syndrome suspected EXAM: CT ANGIOGRAPHY CHEST WITH CONTRAST TECHNIQUE: Multidetector CT imaging of the chest was performed using the standard protocol during bolus administration of intravenous contrast. Multiplanar CT image reconstructions and MIPs were obtained to evaluate the vascular anatomy. RADIATION DOSE REDUCTION: This exam was performed according to the departmental dose-optimization program which includes automated exposure control, adjustment of the mA and/or kV according to patient size and/or use of iterative reconstruction technique. CONTRAST:  133mL OMNIPAQUE IOHEXOL 350 MG/ML SOLN COMPARISON:  None Available. FINDINGS: Cardiovascular: Unenhanced CT, there is no evidence of intramural hematoma. Following contrast administration, there is no evidence of thoracic aortic aneurysm or dissection. Atherosclerotic calcifications of the aortic arch. Although not tailored for evaluation of the pulmonary arteries, there is no evidence of pulmonary embolism to the lobar level. The heart is normal in  size.  No pericardial effusion. Coronary atherosclerosis of the LAD. Mediastinum/Nodes: No suspicious mediastinal lymphadenopathy. Visualized thyroid is unremarkable. Lungs/Pleura: Evaluation lung parenchyma is constrained by respiratory motion. Within that constraint, there are no suspicious pulmonary nodules. Minimal bilateral lower lobe atelectasis. No focal consolidation. No pleural effusion or pneumothorax. Upper Abdomen: Visualized upper abdomen is notable for a mildly nodular hepatic contour, suggesting cirrhosis. Status post cholecystectomy. Musculoskeletal: Visualized osseous structures are within normal limits. Review of the MIP images confirms the above findings. IMPRESSION: No evidence of thoracoabdominal aortic aneurysm or dissection. No evidence of pulmonary embolism. No evidence of acute cardiopulmonary disease. Aortic Atherosclerosis (ICD10-I70.0). Electronically Signed   By: Julian Hy M.D.   On: 07/17/2022 19:24   DG Chest 2 View  Result Date: 07/17/2022 CLINICAL DATA:  Chest pain. EXAM: CHEST - 2 VIEW COMPARISON:  May 08, 2021. FINDINGS: The heart size and mediastinal contours are within normal limits. Both lungs are clear. The visualized skeletal structures are unremarkable. IMPRESSION: No active cardiopulmonary disease. Electronically Signed   By: Marijo Conception M.D.   On: 07/17/2022 17:18    Cardiac Studies   Echo: pending  Patient Profile     66 y.o. male with pancreatitis, HLD, CAD s/p stenting to LAD, DM and cirrhosis who is being seen 07/18/2022 for the evaluation of chest pain/elevated troponin.   Assessment & Plan    NSTEMI CAD s/p stenting of LAD -- presented to the ED with chest pain. hsTn 4>>8>>50>>315. EKG with sinus bradycardia, TWI in inferolateral leads (which appears new).  -- Continue IV heparin, aspirin, statin -- Echo pending   HTN -- blood pressures stable -- Lisinopril held with plans for cath, patient bradycardic on admission, Coreg held  at this time  Sinus Bradycardia -- noted on admission, overall asymptomatic. Some rates in the 30s over night -- coreg held for now   HLD -- continue statin    DM -- hold metformin -- Continue SSI while inpatient   Recent dx of cirrhosis -- outpatient CT 12/5 with cirrhotic hepatic morphology without hepatic lesion, colonic diverticulosis without acute diverticulitis -- following with GI outpatient   For questions or updates, please contact [place] Please consult [email or web address] for contact info under        Signed, Reino Bellis, NP  07/19/2022, 7:41 AM     I have examined the patient and reviewed assessment and plan and discussed with patient.  Agree with above as stated.  Cath result reviewed.  Echo reviewed.  I think the RV function is normal in most views, including the contrast image.  Known LV dysfunction from prior anterior STEMI.  Add Jardiance for  better CHF therapy. Reduce Coreg and continue low dose ACE-I.  Plan for discharge later today.   Larae Grooms

## 2022-07-19 NOTE — Progress Notes (Incomplete)
Echocardiogram 2D Echocardiogram has been performed.  Martin Frank 07/19/2022, 1:25 PM

## 2022-07-19 NOTE — Progress Notes (Signed)
Pt had a golf ball size hematoma after TR band was removed and getting ready to be discharged.  Held pressure and hematoma decreased.  Called cath lab and they placed another TR band with 10 ml of air. Martin Frank from cardiology came and assessed patient. No hematoma present currently.  TR band will come off shortly and pt will be discharged.  Hinton Dyer, RN

## 2022-07-19 NOTE — Discharge Summary (Addendum)
Discharge Summary    Patient ID: Martin Frank MRN: [id number]; DOB: 1955/11/04  Admit date: 07/17/2022 Discharge date: 07/19/2022  PCP:  Bonnita Hollow, MD   [place] Prov[facility] Cardiologist:  Kirk Ruths, MD     Discharge Diagnoses    Principal Problem:   Chest pain Active Problems:   Hyperlipidemia   Type 2 diabetes mellitus with other specified complication [facility])   Essential hypertension   HFrEF (heart failure with reduced ejection fraction) [facility])  Diagnostic Studies/Procedures    Cath: 07/19/2022  1.  Single-vessel coronary artery disease with continued patency of the proximal LAD stented segment 2.  Patent left main, left circumflex, and RCA with minimal to mild nonobstructive plaquing   Recommendations: Check 2D echocardiogram (scheduled today), should be okay for discharge if his echocardiogram is unrevealing.  Findings communicated with the patient's wife.  Echo: 07/19/2022  IMPRESSIONS     1. No apical thrombus with Definity contrast. Left ventricular ejection  fraction, by estimation, is 35 to 40%. Left ventricular ejection fraction  by 2D MOD biplane is 39.8 %. The left ventricle has moderately decreased  function. The left ventricle  demonstrates regional wall motion abnormalities (see scoring  diagram/findings for description). Left ventricular diastolic parameters  are consistent with Grade I diastolic dysfunction (impaired relaxation).  There is severe hypokinesis of the left  ventricular, mid-apical anteroseptal wall, anterior wall, apical segment  and inferior segment.   2. Right ventricular systolic function is mildly reduced. The right  ventricular size is normal. Tricuspid regurgitation signal is inadequate  for assessing PA pressure.   3. The mitral valve is grossly normal. Trivial mitral valve  regurgitation.   4. The aortic valve is tricuspid. Aortic valve regurgitation is not  visualized.   Comparison(s): Changes  from prior study are noted. 12/07/2017: LVEF 45-50%,  LAD territory [facility].   FINDINGS   Left Ventricle: No apical thrombus with Definity contrast. Left  ventricular ejection fraction, by estimation, is 35 to 40%. Left  ventricular ejection fraction by 2D MOD biplane is 39.8 %. The left  ventricle has moderately decreased function. The left  ventricle demonstrates regional wall motion abnormalities. Severe  hypokinesis of the left ventricular, mid-apical anteroseptal wall,  anterior wall, apical segment and inferior segment. Definity contrast  agent was given IV to delineate the left  ventricular endocardial borders. The left ventricular internal cavity size  was normal in size. There is no left ventricular hypertrophy. Left  ventricular diastolic parameters are consistent with Grade I diastolic  dysfunction (impaired relaxation). Normal   left ventricular filling pressure.   Right Ventricle: The right ventricular size is normal. No increase in  right ventricular wall thickness. Right ventricular systolic function is  mildly reduced. Tricuspid regurgitation signal is inadequate for assessing  PA pressure.   Left [facility]: Left atrial size was normal in size.   Right [facility]: Right atrial size was normal in size.   Pericardium: There is no evidence of pericardial effusion.   Mitral Valve: The mitral valve is grossly normal. Trivial mitral valve  regurgitation.   Tricuspid Valve: The tricuspid valve is normal in structure. Tricuspid  valve regurgitation is not demonstrated.   Aortic Valve: The aortic valve is tricuspid. Aortic valve regurgitation is  not visualized. Aortic valve mean gradient measures 3.0 mmHg. Aortic valve  peak gradient measures 5.0 mmHg. Aortic valve area, by VTI measures 3.61  cm.   Pulmonic Valve: The pulmonic valve was normal in structure. Pulmonic valve  regurgitation  is not visualized.   Aorta: The aortic root and ascending aorta are structurally normal,  with  no evidence of dilitation.   Venous: The inferior vena cava was not well visualized.   IAS/Shunts: No atrial level shunt detected by color flow Doppler.      _____________   History of Present Illness     Martin Frank is a 66 y.o. male with pancreatitis, HLD, CAD s/p stenting to LAD, DM and cirrhosis  who is being seen 07/18/2022 for the evaluation of chest pain/elevated troponin.   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 Had episode of vomiting after receiving plavix with subsequent ISR requiring a second intervention. Repeat cath 01/2013 with patent LAD, mild to moderate non-obstructive disease. LVEF 35-40%. Admitted to [facility] 03/2015 and underwent cardiac cath showing moderate PDA disease, small ramus branch with 70-80% ostial lesion, patent LAD stent with normal LV function. He was lost to follow up.   Presented to [facility] 11/2017 with chest pain concerning for unstable angina. Cardiac cath performed showed widely patent ostial to proximal LAD stent, mildly elevated LVEDP. Echo showed EF of 45-50%, mild LVH.    Last seen in the office on 12/2017 for follow up and doing well from cardiac standpoint. Continued on ASA, statin, coreg, lisinopril.    Presented to [place] 12/18 with chest pain that started that evening. Centralized chest pressure with some epigastric pain. This felt quite similar to episodes he has had in the past leading to cardiac cath.    Labs in the ED showed Na+ 135, K+ 4.2, Cr 0.92, hsTn 4>>8>>50>>315, WBC 8.1, Hgb 14.3. EKG showed sinus bradycardia, TWI in inferolateral leads. CXR negative. CT angio was negative for PE. He was placed on IV heparin and transferred to [facility] for further management.    In talking with patient he reported having lots of GI issues recently, currently being worked up with GI with US/CT scans. He was given maalox and pepcid at [facility] with improvement but follow up 3rd troponin was elevated.    He also reports a  cath last year [facility], records showed cardiac cath 04/2021 with patent LAD stent, mild nonobstructive disease.   Hospital Course     Chest pain Elevated Troponin CAD s/p stenting of LAD -- presented to the ED with chest pain. hsTn 4>>8>>50>>315. EKG with sinus bradycardia, TWI in inferolateral leads (which appears new).  Treated with IV heparin.  Underwent cardiac catheterization noted above with single-vessel coronary disease with patent stent in proximal LAD.  Follow-up echocardiogram showed LVEF of 35-40%, WMA in LAD territory similar to prior echo but slight decline in EF.  -- continue ASA, statin, reduced coreg to 3.139m BID with bradycardia  HFrEF -- echo showed slightly further decline in LVEF from 40-45%, to 35-40%. No evidence of volume overload on exam -- GDMT: continue coreg 3.1269mBID, lisinopril 2.36m74maily, add jardiance at discharge  HTN -- blood pressures stable -- Resume lisinopril at discharge, coreg was reduced to 3.1236m71mD with bradycardia   Sinus Bradycardia -- noted on admission, overall asymptomatic. Some episodes of nocturnal bradycardia -- coreg held for now   HLD -- continue statin    DM -- Treated with SSI while inpatient -- Resume metformin 48 hours post cath -- start jardiance    Recent dx of cirrhosis -- outpatient CT 12/5 with cirrhotic hepatic morphology without hepatic lesion, colonic diverticulosis without acute diverticulitis -- following with GI outpatient  Did the patient have an acute coronary syndrome (MI, NSTEMI, STEMI, etc) this admission?:  No                               Did the patient have a percutaneous coronary intervention (stent / angioplasty)?:  No.       The patient will be scheduled for a TOC follow up appointment in 10-14 days.  A message has been sent to the [facility] and Scheduling Pool at the office where the patient should be seen for follow up.  _____________  Discharge Vitals Blood pressure 123/80,  pulse (!) 57, temperature 98.6 F (37 C), temperature source Oral, resp. rate 19, height _0  (1.88 m), weight 97.1 kg, SpO2 98 %.  Filed Weights   07/17/22 1638 07/19/22 0506  Weight: 102.1 kg 97.1 kg    Labs & Radiologic Studies    CBC Recent Labs    07/17/22 1657 07/19/22 0203  WBC 8.1 7.7  HGB 14.3 14.6  HCT 41.9 43.1  MCV 91.3 91.9  PLT 259 191   Basic Metabolic Panel Recent Labs    07/17/22 1657 07/19/22 0203  NA 135 136  K 4.2 3.8  CL 103 106  CO2 25 19*  GLUCOSE 150* 98  BUN 13 11  CREATININE 0.92 0.76  CALCIUM 9.1 8.8*   Liver Function Tests No results for input(s): "AST", "ALT", "ALKPHOS", "BILITOT", "PROT", "ALBUMIN" in the last 72 hours. No results for input(s): "LIPASE", "AMYLASE" in the last 72 hours. High Sensitivity Troponin:   Recent Labs  Lab 07/17/22 1657 07/17/22 1748 07/17/22 1857 07/17/22 2058  TROPONINIHS 4 315* 8 50*    BNP Invalid input(s): "POCBNP" D-Dimer No results for input(s): "DDIMER" in the last 72 hours. Hemoglobin A1C No results for input(s): "HGBA1C" in the last 72 hours. Fasting Lipid Panel Recent Labs    07/19/22 0203  CHOL 183  HDL 46  LDLCALC 116*  TRIG 103  CHOLHDL 4.0   Thyroid Function Tests No results for input(s): "TSH", "T4TOTAL", "T3FREE", "THYROIDAB" in the last 72 hours.  Invalid input(s): "FREET3" _____________  ECHOCARDIOGRAM COMPLETE  Result Date: 07/19/2022    ECHOCARDIOGRAM REPORT   Patient Name:   Martin Frank Date of Exam: 07/19/2022 Medical Rec #:  [id number]       Height:       74.0 in Accession #:    [id number]      Weight:       214.0 lb Date of Birth:  03-30-1956       BSA:          2.237 m Patient Age:    66 years        BP:           123/80 mmHg Patient Gender: M               HR:           53 bpm. Exam Location:  Inpatient Procedure: 2D Echo, Color Doppler, Cardiac Doppler and Intracardiac            Opacification Agent                                 MODIFIED REPORT: This report  was modified by Lyman Bishop MD on 07/19/2022 due to RV function is  thought to only be mildly reduced.  Indications:     Chest Pain R07.9  History:         Patient has prior history of Echocardiogram examinations, most                  recent 12/07/2017. CHF and Cardiomyopathy, CAD, Angina and                  Previous Myocardial Infarction; Risk Factors:Hypertension,                  Diabetes and Dyslipidemia.  Sonographer:     Ronny Flurry Referring Phys:  [facility] Diagnosing Phys: Lyman Bishop MD  Sonographer Comments: Technically difficult study due to poor echo windows. IMPRESSIONS  1. No apical thrombus with Definity contrast. Left ventricular ejection fraction, by estimation, is 35 to 40%. Left ventricular ejection fraction by 2D MOD biplane is 39.8 %. The left ventricle has moderately decreased function. The left ventricle demonstrates regional wall motion abnormalities (see scoring diagram/findings for description). Left ventricular diastolic parameters are consistent with Grade I diastolic dysfunction (impaired relaxation). There is severe hypokinesis of the left ventricular, mid-apical anteroseptal wall, anterior wall, apical segment and inferior segment.  2. Right ventricular systolic function is mildly reduced. The right ventricular size is normal. Tricuspid regurgitation signal is inadequate for assessing PA pressure.  3. The mitral valve is grossly normal. Trivial mitral valve regurgitation.  4. The aortic valve is tricuspid. Aortic valve regurgitation is not visualized. Comparison(s): Changes from prior study are noted. 12/07/2017: LVEF 45-50%, LAD territory [facility]. FINDINGS  Left Ventricle: No apical thrombus with Definity contrast. Left ventricular ejection fraction, by estimation, is 35 to 40%. Left ventricular ejection fraction by 2D MOD biplane is 39.8 %. The left ventricle has moderately decreased function. The left ventricle demonstrates regional wall  motion abnormalities. Severe hypokinesis of the left ventricular, mid-apical anteroseptal wall, anterior wall, apical segment and inferior segment. Definity contrast agent was given IV to delineate the left ventricular endocardial borders. The left ventricular internal cavity size was normal in size. There is no left ventricular hypertrophy. Left ventricular diastolic parameters are consistent with Grade I diastolic dysfunction (impaired relaxation). Normal  left ventricular filling pressure. Right Ventricle: The right ventricular size is normal. No increase in right ventricular wall thickness. Right ventricular systolic function is mildly reduced. Tricuspid regurgitation signal is inadequate for assessing PA pressure. Left [facility]: Left atrial size was normal in size. Right [facility]: Right atrial size was normal in size. Pericardium: There is no evidence of pericardial effusion. Mitral Valve: The mitral valve is grossly normal. Trivial mitral valve regurgitation. Tricuspid Valve: The tricuspid valve is normal in structure. Tricuspid valve regurgitation is not demonstrated. Aortic Valve: The aortic valve is tricuspid. Aortic valve regurgitation is not visualized. Aortic valve mean gradient measures 3.0 mmHg. Aortic valve peak gradient measures 5.0 mmHg. Aortic valve area, by VTI measures 3.61 cm. Pulmonic Valve: The pulmonic valve was normal in structure. Pulmonic valve regurgitation is not visualized. Aorta: The aortic root and ascending aorta are structurally normal, with no evidence of dilitation. Venous: The inferior vena cava was not well visualized. IAS/Shunts: No atrial level shunt detected by color flow Doppler.  LEFT VENTRICLE PLAX 2D                        Biplane EF (MOD) LVOT diam:     2.30 cm  LV Biplane EF:   Left LV SV:         99                               ventricular LV SV Index:   44                               ejection LVOT Area:     4.15 cm                         fraction by                                                  2D MOD                                                 biplane is LV Volumes (MOD)                                39.8 %. LV vol d, MOD    82.6 ml A2C:                           Diastology LV vol d, MOD    159.0 ml      LV e' medial:    6.68 cm/s A4C:                           LV E/e' medial:  7.1 LV vol s, MOD    45.0 ml       LV e' lateral:   9.01 cm/s A2C:                           LV E/e' lateral: 5.3 LV vol s, MOD    99.7 ml A4C: LV SV MOD A2C:   37.6 ml LV SV MOD A4C:   159.0 ml LV SV MOD BP:    48.4 ml RIGHT VENTRICLE RV S prime:     7.51 cm/s TAPSE (M-mode): 1.2 cm LEFT ATRIUM             Index        RIGHT ATRIUM           Index LA Vol (A2C):   67.7 ml 30.27 ml/m  RA Area:     15.10 cm LA Vol (A4C):   58.0 ml 25.93 ml/m  RA Volume:   32.60 ml  14.57 ml/m LA Biplane Vol: 65.0 ml 29.06 ml/m  AORTIC VALVE AV Area (Vmax):    3.75 cm AV Area (Vmean):   3.61 cm AV Area (VTI):     3.61 cm AV Vmax:           112.00 cm/s AV Vmean:          75.400 cm/s AV VTI:            0.275 m AV Peak  Grad:      5.0 mmHg AV Mean Grad:      3.0 mmHg LVOT Vmax:         101.00 cm/s LVOT Vmean:        65.600 cm/s LVOT VTI:          0.239 m LVOT/AV VTI ratio: 0.87  AORTA Ao Root diam: 3.60 cm Ao Asc diam:  3.20 cm MITRAL VALVE MV Area (PHT): 2.44 cm    SHUNTS MV Decel Time: 311 msec    Systemic VTI:  0.24 m MV E velocity: 47.60 cm/s  Systemic Diam: 2.30 cm MV A velocity: 71.10 cm/s MV E/A ratio:  0.67 Lyman Bishop MD Electronically signed by Lyman Bishop MD Signature Date/Time: 07/19/2022/2:05:25 PM    Final (Updated)    CARDIAC CATHETERIZATION  Result Date: 07/19/2022 1.  Single-vessel coronary artery disease with continued patency of the proximal LAD stented segment 2.  Patent left main, left circumflex, and RCA with minimal to mild nonobstructive plaquing Recommendations: Check 2D echocardiogram (scheduled today), should be okay for discharge if his echocardiogram is unrevealing.   Findings communicated with the patient's wife.   CT Angio Chest Aorta W and/or Wo Contrast  Result Date: 07/17/2022 CLINICAL DATA:  Chest pain, dizziness, acute aortic syndrome suspected EXAM: CT ANGIOGRAPHY CHEST WITH CONTRAST TECHNIQUE: Multidetector CT imaging of the chest was performed using the standard protocol during bolus administration of intravenous contrast. Multiplanar CT image reconstructions and MIPs were obtained to evaluate the vascular anatomy. RADIATION DOSE REDUCTION: This exam was performed according to the departmental dose-optimization program which includes automated exposure control, adjustment of the mA and/or kV according to patient size and/or use of iterative reconstruction technique. CONTRAST:  159m OMNIPAQUE IOHEXOL 350 MG/ML SOLN COMPARISON:  None Available. FINDINGS: Cardiovascular: Unenhanced CT, there is no evidence of intramural hematoma. Following contrast administration, there is no evidence of thoracic aortic aneurysm or dissection. Atherosclerotic calcifications of the aortic arch. Although not tailored for evaluation of the pulmonary arteries, there is no evidence of pulmonary embolism to the lobar level. The heart is normal in size.  No pericardial effusion. Coronary atherosclerosis of the LAD. Mediastinum/Nodes: No suspicious mediastinal lymphadenopathy. Visualized thyroid is unremarkable. Lungs/Pleura: Evaluation lung parenchyma is constrained by respiratory motion. Within that constraint, there are no suspicious pulmonary nodules. Minimal bilateral lower lobe atelectasis. No focal consolidation. No pleural effusion or pneumothorax. Upper Abdomen: Visualized upper abdomen is notable for a mildly nodular hepatic contour, suggesting cirrhosis. Status post cholecystectomy. Musculoskeletal: Visualized osseous structures are within normal limits. Review of the MIP images confirms the above findings. IMPRESSION: No evidence of thoracoabdominal aortic aneurysm or dissection.  No evidence of pulmonary embolism. No evidence of acute cardiopulmonary disease. Aortic Atherosclerosis (ICD10-I70.0). Electronically Signed   By: SJulian HyM.D.   On: 07/17/2022 19:24   DG Chest 2 View  Result Date: 07/17/2022 CLINICAL DATA:  Chest pain. EXAM: CHEST - 2 VIEW COMPARISON:  May 08, 2021. FINDINGS: The heart size and mediastinal contours are within normal limits. Both lungs are clear. The visualized skeletal structures are unremarkable. IMPRESSION: No active cardiopulmonary disease. Electronically Signed   By: JMarijo ConceptionM.D.   On: 07/17/2022 17:18   CT Abdomen Pelvis W Contrast  Result Date: 07/04/2022 CLINICAL DATA:  Hepatic cirrhosis. EXAM: CT ABDOMEN AND PELVIS WITH CONTRAST TECHNIQUE: Multidetector CT imaging of the abdomen and pelvis was performed using the standard protocol following bolus administration of intravenous contrast. RADIATION DOSE REDUCTION: This exam was performed according  to the departmental dose-optimization program which includes automated exposure control, adjustment of the mA and/or kV according to patient size and/or use of iterative reconstruction technique. CONTRAST:  176m OMNIPAQUE IOHEXOL 300 MG/ML  SOLN COMPARISON:  Ultrasound May 23, 2022. FINDINGS: Lower chest: Incompletely visualized clustered nodularity in the right lower lobe on image 1/4. Band of subendocardial hypodensity with calcific foci along the left ventricular septum and apex compatible with prior infarction. Hepatobiliary: Cirrhotic hepatic morphology. No suspicious hepatic lesion. Gallbladder surgically absent. No biliary ductal dilation. Pancreas: Pancreatic ductal dilation or evidence of acute inflammation. Spleen: No splenomegaly. Adrenals/Urinary Tract: Bilateral adrenal glands appear normal. No hydronephrosis. Kidneys demonstrate symmetric enhancement and excretion of contrast material. No solid enhancing renal mass. Stomach/Bowel: Stomach is nondistended limiting  evaluation. No pathologic dilation of small or large bowel. Colonic diverticulosis without findings of acute diverticulitis. No evidence of acute bowel inflammation. Vascular/Lymphatic: Aortic atherosclerosis. Smooth IVC contours. The portal, splenic and superior mesenteric veins are patent. Portosystemic collateral vessels. Prominent upper abdominal lymph nodes for instance a gastrohepatic ligament lymph node measuring 8 mm in short axis on image 31/2 and a hepatoduodenal ligament lymph node measuring 7 mm in short axis on image 36/2 Reproductive: Prostate is unremarkable. Other: No significant abdominopelvic free fluid. Musculoskeletal: No acute osseous abnormality. Multilevel degenerative changes spine. IMPRESSION: 1. Cirrhotic hepatic morphology without suspicious hepatic lesion. 2. Incompletely visualized clustered nodularity in the right lower lobe, likely reflecting an infectious or inflammatory process consider further evaluation with dedicated chest CT. 3. Prominent upper abdominal lymph nodes are nonspecific but likely reactive. Attention on follow-up imaging suggested. 4. Colonic diverticulosis without findings of acute diverticulitis. 5. Band of subendocardial hypodensity with calcific foci along the left ventricular septum and apex compatible with prior infarction. 6.  Aortic Atherosclerosis (ICD10-I70.0). Electronically Signed   By: JDahlia BailiffM.D.   On: 07/04/2022 13:00   Disposition   Pt is being discharged home today in good condition.  Follow-up Plans & Appointments     Follow-up Information     Connect with your PCP/Specialist as discussed. Schedule an appointment as soon as possible for a visit .   Contact information: [email or web address] our physician referral line at [phone]        MLenna Sciara NP Follow up on 08/02/2022.   Specialties: Cardiology, Family Medicine Why: at 8:25am for your follow up appt Contact information: [address] [place] [phone]               Discharge Instructions     AMB referral to Phase II Cardiac Rehabilitation   Complete by: As directed    Diagnosis: NSTEMI   After initial evaluation and assessments completed: Virtual Based Care may be provided alone or in conjunction with Phase 2 Cardiac Rehab based on patient barriers.: Yes   Intensive Cardiac Rehabilitation ([facility]) [place] only OR Traditional Cardiac Rehabilitation (TCR) *If criteria for ICR are not met will enroll in [facility] Yes   Call MD for:  redness, tenderness, or signs of infection (pain, swelling, redness, odor or green/yellow discharge around incision site)   Complete by: As directed    Diet - low sodium heart healthy   Complete by: As directed    Discharge instructions   Complete by: As directed    Radial Site Care Refer to this sheet in the next few weeks. These instructions provide you with information on caring for yourself after your procedure. Your caregiver may also  give you more specific instructions. Your treatment has been planned according to current medical practices, but problems sometimes occur. Call your caregiver if you have any problems or questions after your procedure. HOME CARE INSTRUCTIONS You may shower the day after the procedure. Remove the bandage (dressing) and gently wash the site with plain soap and water. Gently pat the site dry.  Do not apply powder or lotion to the site.  Do not submerge the affected site in water for 3 to 5 days.  Inspect the site at least twice daily.  Do not flex or bend the affected arm for 24 hours.  No lifting over 5 pounds (2.3 kg) for 5 days after your procedure.  Do not drive home if you are discharged the same day of the procedure. Have someone else drive you.  You may drive 24 hours after the procedure unless otherwise instructed by your caregiver.  What to expect: Any bruising will usually fade within 1 to 2  weeks.  Blood that collects in the tissue (hematoma) may be painful to the touch. It should usually decrease in size and tenderness within 1 to 2 weeks.  SEEK IMMEDIATE MEDICAL CARE IF: You have unusual pain at the radial site.  You have redness, warmth, swelling, or pain at the radial site.  You have drainage (other than a small amount of blood on the dressing).  You have chills.  You have a fever or persistent symptoms for more than 72 hours.  You have a fever and your symptoms suddenly get worse.  Your arm becomes pale, cool, tingly, or numb.  You have heavy bleeding from the site. Hold pressure on the site.   Increase activity slowly   Complete by: As directed       Discharge Medications   Allergies as of 07/19/2022       Reactions   Bee Venom Anaphylaxis        Medication List     TAKE these medications    acetaminophen 500 MG tablet Commonly known as: TYLENOL Take 500 mg by mouth 2 (two) times daily as needed for moderate pain or headache.   aspirin EC 81 MG tablet Take 1 tablet (81 mg total) by mouth daily. Swallow whole. Start taking on: July 20, 2022   busPIRone 10 MG tablet Commonly known as: BUSPAR Take 1 tablet (10 mg total) by mouth 3 (three) times daily.   carvedilol 3.125 MG tablet Commonly known as: COREG Take 1 tablet (3.125 mg total) by mouth 2 (two) times daily with a meal. What changed:  medication strength how much to take   citalopram 40 MG tablet Commonly known as: CELEXA Take 1 tablet (40 mg total) by mouth daily.   empagliflozin 10 MG Tabs tablet Commonly known as: JARDIANCE Take 1 tablet (10 mg total) by mouth daily.   Fish Oil 1000 MG Cpdr Take 1,000 mg by mouth daily.   lisinopril 2.5 MG tablet Commonly known as: ZESTRIL TAKE 1 TABLET (2.5 MG TOTAL) BY MOUTH DAILY. PLEASE CONTACT OFFICE FOR ADDITIONAL REFILLS What changed: additional instructions   metFORMIN 500 MG tablet Commonly known as: GLUCOPHAGE Take 1 tablet  (500 mg total) by mouth 2 (two) times daily with a meal.   nitroGLYCERIN 0.4 MG SL tablet Commonly known as: NITROSTAT PLACE 1 TABLET (0.4 MG TOTAL) UNDER THE TONGUE EVERY 5 (FIVE) MINUTES AS NEEDED FOR CHEST PAIN (MAY REPEAT X 3 THAN CALL 911). What changed: reasons to take this   pantoprazole 40  MG tablet Commonly known as: PROTONIX TAKE 1 TABLET BY MOUTH EVERY DAY What changed: when to take this   rosuvastatin 40 MG tablet Commonly known as: CRESTOR Take 1 tablet (40 mg total) by mouth daily.        Outstanding Labs/Studies   N/a  Duration of Discharge Encounter   Greater than 30 minutes including physician time.  Signed, Reino Bellis, NP 07/19/2022, 3:41 PM  I have examined the patient and reviewed assessment and plan and discussed with patient.  Agree with above as stated.  Cath result reviewed.  Echo reviewed.  I think the RV function is normal in most views, including the contrast image.  Known LV dysfunction from prior anterior STEMI.  Add Jardiance for better CHF therapy. Reduce Coreg and continue low dose ACE-I.  Plan for discharge later today.    Larae Grooms

## 2022-07-19 NOTE — Progress Notes (Signed)
ANTICOAGULATION CONSULT NOTE   Pharmacy Consult for heparin  Indication: chest pain/ACS  Allergies  Allergen Reactions   Bee Venom Anaphylaxis    Patient Measurements: Height: 6\' 2"  (188 cm) Weight: 97.1 kg (214 lb) IBW/kg (Calculated) : 82.2 Heparin Dosing Weight: 102.1kg   Vital Signs: Temp: 97.8 F (36.6 C) (12/20 0456) Temp Source: Oral (12/20 0456) BP: 125/72 (12/20 0024) Pulse Rate: 56 (12/20 0456)  Labs: Recent Labs    07/17/22 1657 07/17/22 1748 07/17/22 1857 07/17/22 2058 12/Martin/23 0400 12/Martin/23 1708 07/19/22 0203  HGB 14.3  --   --   --   --   --  14.6  HCT 41.9  --   --   --   --   --  43.1  PLT 259  --   --   --   --   --  195  HEPARINUNFRC  --   --   --   --  0.39 0.44 0.42  CREATININE 0.92  --   --   --   --   --  0.76  TROPONINIHS 4 315* 8 50*  --   --   --      Estimated Creatinine Clearance: 107 mL/min (by C-G formula based on SCr of 0.76 mg/dL).   Medical History: Past Medical History:  Diagnosis Date   Anterior myocardial infarction (HCC) 11/2008   CAD (coronary artery disease)    remote anterior MI with VT and DES to the LAD in May of 2010; NSTEMI July 2014 in August 2014 with cath showing stent patency, EF of 35 to 40% and possible vasospasm versus potential small vessel disease.    Cardiomyopathy, ischemic 05/07/2013   Chronic ischemic heart disease 04/06/2013   Formatting of this note might be different from the original. STORY: 10/2008 and hospitalized 01/2013 with positive troponins but widely patent arteries on cath. Formatting of this note might be different from the original. STORY: 10/2008 and hospitalized 01/2013 with positive troponins but widely patent arteries on cath. Formatting of this note might be different from the original. STORY: 10/2008 and    Chronic systolic heart failure (HCC)    Depression    Former smoker 12/12/2017   GERD (gastroesophageal reflux disease) 4 years   H/O heart artery stent 05/20/2019   Hx of cardiovascular  stress test 2015   Lexiscan Myoview (11/2013):  Anteroseptal, apical anterior, apex, apical inferior scar; no ischemia, EF 50%; Intermediate Risk   Hyperlipidemia    Hypertension    Ischemic cardiomyopathy    a.Echo (11/2013):  EF 45-50%, apical HK   MI (myocardial infarction) (HCC) ? 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   Type II diabetes mellitus (HCC)     Assessment: Martin Frank with hx CAD admitted with CP. Pharmacy to dose IV heparin for ACS, no AC prior to admit.  Heparin level therapeutic, H/H stable. Cardiac cath planned later today.  Goal of Therapy:  Heparin level 0.3-0.7 units/ml Monitor platelets by anticoagulation protocol: Yes   Plan:  Cont heparin 1200 units/hr Daily heparin level and CBC  76, PharmD, [place], [facility] Clinical Pharmacist [phone] Please check AMION for all [facility] Pharmacy numbers 07/19/2022

## 2022-07-20 LAB — LIPOPROTEIN A (LPA): Lipoprotein (a): 128.3 nmol/L — ABNORMAL HIGH (ref <75.0)

## 2022-07-29 ENCOUNTER — Other Ambulatory Visit: Payer: Self-pay | Admitting: Family Medicine

## 2022-08-02 ENCOUNTER — Encounter: Payer: Self-pay | Admitting: Pharmacist Clinician (PhC)/ Clinical Pharmacy Specialist

## 2022-08-02 ENCOUNTER — Encounter: Payer: Self-pay | Admitting: Nurse Practitioner

## 2022-08-02 ENCOUNTER — Ambulatory Visit: Payer: Medicare Other | Attending: Nurse Practitioner | Admitting: Nurse Practitioner

## 2022-08-02 ENCOUNTER — Ambulatory Visit (INDEPENDENT_AMBULATORY_CARE_PROVIDER_SITE_OTHER): Payer: Medicare Other | Admitting: Pharmacist Clinician (PhC)/ Clinical Pharmacy Specialist

## 2022-08-02 VITALS — BP 98/70 | HR 62 | Ht 74.0 in | Wt 220.0 lb

## 2022-08-02 DIAGNOSIS — I10 Essential (primary) hypertension: Secondary | ICD-10-CM | POA: Insufficient documentation

## 2022-08-02 DIAGNOSIS — R001 Bradycardia, unspecified: Secondary | ICD-10-CM | POA: Diagnosis not present

## 2022-08-02 DIAGNOSIS — I2510 Atherosclerotic heart disease of native coronary artery without angina pectoris: Secondary | ICD-10-CM

## 2022-08-02 DIAGNOSIS — E785 Hyperlipidemia, unspecified: Secondary | ICD-10-CM

## 2022-08-02 DIAGNOSIS — E1169 Type 2 diabetes mellitus with other specified complication: Secondary | ICD-10-CM

## 2022-08-02 DIAGNOSIS — I5022 Chronic systolic (congestive) heart failure: Secondary | ICD-10-CM | POA: Diagnosis not present

## 2022-08-02 DIAGNOSIS — I255 Ischemic cardiomyopathy: Secondary | ICD-10-CM | POA: Diagnosis not present

## 2022-08-02 DIAGNOSIS — R7989 Other specified abnormal findings of blood chemistry: Secondary | ICD-10-CM | POA: Insufficient documentation

## 2022-08-02 LAB — BASIC METABOLIC PANEL
BUN/Creatinine Ratio: 13 (ref 10–24)
BUN: 12 mg/dL (ref 8–27)
CO2: 24 mmol/L (ref 20–29)
Calcium: 9.8 mg/dL (ref 8.6–10.2)
Chloride: 103 mmol/L (ref 96–106)
Creatinine, Ser: 0.94 mg/dL (ref 0.76–1.27)
Glucose: 126 mg/dL — ABNORMAL HIGH (ref 70–99)
Potassium: 4.6 mmol/L (ref 3.5–5.2)
Sodium: 138 mmol/L (ref 134–144)
eGFR: 90 mL/min/{1.73_m2} (ref >59)

## 2022-08-02 MED ORDER — EZETIMIBE 10 MG PO TABS: 10.0000 mg | ORAL_TABLET | Freq: Every day | ORAL | 3 refills | Status: DC

## 2022-08-02 NOTE — Patient Instructions (Addendum)
Medication Instructions:  Your physician recommends that you continue on your current medications as directed. Please refer to the Current Medication list given to you today.   *If you need a refill on your cardiac medications before your next appointment, please call your pharmacy*   Lab Work: Your physician recommends that you complete lab work today. BMET  If you have labs (blood work) drawn today and your tests are completely normal, you will receive your results only by: [place] (if you have MyChart) OR A paper copy in the mail If you have any lab test that is abnormal or we need to change your treatment, we will call you to review the results.   Testing/Procedures: NONE ordered at this time of appointment    Follow-Up: At [facility], you and your health needs are our priority.  As part of our continuing mission to provide you with exceptional heart care, we have created designated Provider Care Teams.  These Care Teams include your primary Cardiologist (physician) and Advanced Practice Providers (APPs -  Physician Assistants and Nurse Practitioners) who all work together to provide you with the care you need, when you need it.  We recommend signing up for the patient portal called "MyChart".  Sign up information is provided on this After Visit Summary.  MyChart is used to connect with patients for Virtual Visits (Telemedicine).  Patients are able to view lab/test results, encounter notes, upcoming appointments, etc.  Non-urgent messages can be sent to your provider as well.   To learn more about what you can do with MyChart, go to [email or web address].    Your next appointment:   4-6 month(s)  The format for your next appointment:   In Person  Provider:   Kirk Ruths, MD     Other Instructions   Important Information About Sugar

## 2022-08-02 NOTE — Patient Instructions (Signed)
Your Results:             Your most recent labs Goal  Total Cholesterol 183 < 200  Triglycerides 103 < 150  HDL (happy/good cholesterol) 46 > 40  LDL (lousy/bad cholesterol 116 < 55   Medication changes:  Add ezetimibe 10 mg once daily.  Continue with rosuvastatin 40 mg daily  Lab orders:  We want to repeat labs after 2-3 months.  We will send you a lab order to remind you once we get closer to that time.     Thank you for choosing [facility]

## 2022-08-02 NOTE — Progress Notes (Signed)
Office Visit    Patient Name: Martin Frank Date of Encounter: 08/02/2022  Primary Care Provider:  Bonnita Hollow, MD Primary Cardiologist:  Kirk Ruths, MD  Chief Complaint    Hyperlipidemia   Significant Past Medical History   CAD 2010 - NSTEMI w/DES to LAD, ISR with second intervention; 2016 cath with 70-80% small ramus ostial lesion, patent LAD;   CHF HFrEF - 35-40% - on carvedilol 3.125, lisinopril 2.5, empagliflozin  HTN Treated with carvedilol, lisinopril,   DM2 A1c 6.0 on empagliflozin, metformin  Sinus bradycardia      Allergies  Allergen Reactions   Bee Venom Anaphylaxis    History of Present Illness    Martin Frank is a 67 y.o. male patient of Dr Stanford Breed in the office today to discuss options for lowering cholesterol.  Patient notes that he has been on rosuvastatin 40 mg for several years, without issues, but earlier this year was decreased to 20 mg daily.  He only recently went back to 40 mg daily.  His labs drawn on Dec 20 reflect his being on the lower dose, as previous labs showed the LDL in the 60-80 range.    Insurance Carrier:  [employer]  LDL Cholesterol goal:  LDL < 55  Current Medications:   rosuvastatin 40  Social Hx: Tobacco:  former smoker, quit in [place] (ppd x 10 yrs) Alcohol:   occasional alcohol    Accessory Clinical Findings   Lab Results  Component Value Date   CHOL 183 07/19/2022   HDL 46 07/19/2022   LDLCALC 116 (H) 07/19/2022   TRIG 103 07/19/2022   CHOLHDL 4.0 07/19/2022    Lab Results  Component Value Date   ALT 43 06/30/2022   AST 40 (H) 06/30/2022   ALKPHOS 95 06/30/2022   BILITOT 0.8 06/30/2022   Lab Results  Component Value Date   CREATININE 0.76 07/19/2022   BUN 11 07/19/2022   NA 136 07/19/2022   K 3.8 07/19/2022   CL 106 07/19/2022   CO2 19 (L) 07/19/2022   Lab Results  Component Value Date   HGBA1C 6.0 (A) 05/15/2022    Home Medications    Current Outpatient Medications   Medication Sig Dispense Refill   ezetimibe (ZETIA) 10 MG tablet Take 1 tablet (10 mg total) by mouth daily. 90 tablet 3   acetaminophen (TYLENOL) 500 MG tablet Take 500 mg by mouth 2 (two) times daily as needed for moderate pain or headache.     aspirin EC 81 MG tablet Take 1 tablet (81 mg total) by mouth daily. Swallow whole. 90 tablet 1   busPIRone (BUSPAR) 10 MG tablet Take 1 tablet (10 mg total) by mouth 3 (three) times daily. 270 tablet 0   carvedilol (COREG) 3.125 MG tablet Take 1 tablet (3.125 mg total) by mouth 2 (two) times daily with a meal. 180 tablet 0   citalopram (CELEXA) 40 MG tablet Take 1 tablet (40 mg total) by mouth daily. 30 tablet 2   empagliflozin (JARDIANCE) 10 MG TABS tablet Take 1 tablet (10 mg total) by mouth daily. 30 tablet 2   lisinopril (ZESTRIL) 2.5 MG tablet TAKE 1 TABLET (2.5 MG TOTAL) BY MOUTH DAILY. PLEASE CONTACT OFFICE FOR ADDITIONAL REFILLS (Patient taking differently: Take 2.5 mg by mouth daily.) 90 tablet 2   metFORMIN (GLUCOPHAGE) 500 MG tablet TAKE 1 TABLET BY MOUTH 2 TIMES DAILY WITH A MEAL. 180 tablet 1   nitroGLYCERIN (NITROSTAT) 0.4 MG SL tablet  PLACE 1 TABLET (0.4 MG TOTAL) UNDER THE TONGUE EVERY 5 (FIVE) MINUTES AS NEEDED FOR CHEST PAIN (MAY REPEAT X 3 THAN CALL 911). (Patient taking differently: Place 0.4 mg under the tongue every 5 (five) minutes as needed for chest pain.) 25 tablet 3   Omega-3 Fatty Acids (FISH OIL) 1000 MG CPDR Take 1,000 mg by mouth daily.     pantoprazole (PROTONIX) 40 MG tablet TAKE 1 TABLET BY MOUTH EVERY DAY (Patient taking differently: Take 40 mg by mouth every evening.) 90 tablet 0   rosuvastatin (CRESTOR) 40 MG tablet Take 1 tablet (40 mg total) by mouth daily. 30 tablet 6   No current facility-administered medications for this visit.     Assessment & Plan    Hyperlipidemia Assessment: Patient with ASCVD/familial hyperlipidemia not at LDL goal of < 55 Has been compliant with high intensity statin:  rosuvastatin 40  mg (increased from 20 mg last month)  Reviewed options for lowering LDL cholesterol  Plan: Patient agreeable to starting ezetimibe 10 mg daily Repeat labs after:  3 months Lipid Liver function   Tommy Medal, PharmD CPP [facility] [address] [place]  [place], [place] [address] [phone]  08/02/2022, 3:57 PM

## 2022-08-02 NOTE — Progress Notes (Signed)
Office Visit    Patient Name: Martin Frank Date of Encounter: 08/02/2022  Primary Care Provider:  Bonnita Hollow, MD Primary Cardiologist:  Kirk Ruths, MD  Chief Complaint    67 year old male with a history of CAD s/p DES-LAD in 8295, chronic systolic heart failure, ICM, sinus bradycardia, hypertension, hyperlipidemia, type 2 diabetes, pancreatitis, and cirrhosis who presents for hospital follow-up related to CAD.  Past Medical History    Past Medical History:  Diagnosis Date   Anterior myocardial infarction ([place]) 11/2008   CAD (coronary artery disease)    remote anterior MI with VT and DES to the LAD in May of 2010; NSTEMI July 2014 in [place] with cath showing stent patency, EF of 35 to 40% and possible vasospasm versus potential small vessel disease.    Cardiomyopathy, ischemic 05/07/2013   Chronic ischemic heart disease 04/06/2013   Formatting of this note might be different from the original. STORY: 10/2008 and hospitalized 01/2013 with positive troponins but widely patent arteries on cath. Formatting of this note might be different from the original. STORY: 10/2008 and hospitalized 01/2013 with positive troponins but widely patent arteries on cath. Formatting of this note might be different from the original. STORY: 10/2008 and    Chronic systolic heart failure ([place])    Depression    Former smoker 12/12/2017   GERD (gastroesophageal reflux disease) 4 years   H/O heart artery stent 05/20/2019   Hx of cardiovascular stress test 2015   Lexiscan Myoview (11/2013):  Anteroseptal, apical anterior, apex, apical inferior scar; no ischemia, EF 50%; Intermediate Risk   Hyperlipidemia    Hypertension    Ischemic cardiomyopathy    a.Echo (11/2013):  EF 45-50%, apical HK   MI (myocardial infarction) ([place]) ? 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   Type II diabetes mellitus [facility])    Past  Surgical History:  Procedure Laterality Date   CARDIAC CATHETERIZATION  X2   CARDIAC VALVE REPLACEMENT     COLONOSCOPY     Multiple-last 2021 history of polyps   CORONARY ANGIOGRAPHY N/A 07/19/2022   Procedure: CORONARY ANGIOGRAPHY;  Surgeon: Sherren Mocha, MD;  Location: [place] [facility];  Service: Cardiovascular;  Laterality: N/A;   CORONARY ANGIOPLASTY WITH STENT PLACEMENT  5/2010X 2   "1+1" (12/05/2017)   LAPAROSCOPIC CHOLECYSTECTOMY     LEFT HEART CATH AND CORONARY ANGIOGRAPHY N/A 12/06/2017   Procedure: LEFT HEART CATH AND CORONARY ANGIOGRAPHY;  Surgeon: Lorretta Harp, MD;  Location: [place] [facility];  Service: Cardiovascular;  Laterality: N/A;   ORCHIECTOMY Right 1960s    Allergies  Allergies  Allergen Reactions   Bee Venom Anaphylaxis    History of Present Illness    67 year old male with the above past medical history including CAD s/p DES-LAD in 6213, chronic systolic heart failure, ICM, sinus bradycardia, hypertension, hyperlipidemia, type 2 diabetes, pancreatitis, and cirrhosis.  He has a history of anterior MI complicated by VT in 0865 s/p DES-LAD.  He had an episode of vomiting after receiving Plavix which resulted in subsequent ISR requiring a second intervention. Repeat cath in 01/2013 showed patent LAD, mild to moderate nonobstructive disease, EF 35 to 40%.  He was hospitalized in 2016 and underwent cardiac cath which showed moderate PDA disease, small ramus branch with 70 to 80% ostial lesion, patent LAD stent with normal LV function.  He was hospitalized in  11/2017 with chest pain concerning for unstable angina.  Repeat cardiac catheterization at the time showed widely patent ostial to proximal LAD stent, mildly elevated LVEDP.  Echocardiogram showed EF 45 to 50%, mild LVH.  He was last seen in the office in 12/2017 and was stable from a cardiac standpoint.  He underwent repeat cath in 04/2021 through [facility] which showed patent LAD stent, mild nonobstructive  disease.  He presented to med [facility] on 07/17/2022 with chest pain, similar to prior anginal equivalent.  Troponin was elevated, EKG showed sinus bradycardia, T wave inversion in inferolateral leads.  He was transferred to [facility] underwent repeat cardiac catheterization which revealed single-vessel CAD with continued patency of the proximal LAD stent, RCA with minimal to mild nonobstructive plaquing.  Echocardiogram showed EF 35 to 40%, moderately decreased LV function, RWMA, G1 DD, mildly reduced RV systolic function, no significant valvular abnormalities.  Given slight decline in EF, he was started on Jardiance.  Carvedilol was reduced to 3.125 mg twice daily in the setting of bradycardia.  He was discharged home in stable condition on 07/19/2022.  He was referred to lipid clinic Pharm.D. for consideration of PCSK9 inhibitor.  He presents today for follow-up.  Since his hospitalization he has done well from a cardiac standpoint.  He denies symptoms concerning for angina, denies dyspnea, edema, PND, orthopnea, weight gain.  He did have a hematoma at his left radial cath site prior to hospital discharge, however, this has healed well.  He is tolerating his Jardiance.  BP is somewhat low, however, he denies any dizziness, presyncope, syncope.  Overall, he reports feeling well.  Home Medications    Current Outpatient Medications  Medication Sig Dispense Refill   acetaminophen (TYLENOL) 500 MG tablet Take 500 mg by mouth 2 (two) times daily as needed for moderate pain or headache.     aspirin EC 81 MG tablet Take 1 tablet (81 mg total) by mouth daily. Swallow whole. 90 tablet 1   busPIRone (BUSPAR) 10 MG tablet Take 1 tablet (10 mg total) by mouth 3 (three) times daily. 270 tablet 0   carvedilol (COREG) 3.125 MG tablet Take 1 tablet (3.125 mg total) by mouth 2 (two) times daily with a meal. 180 tablet 0   citalopram (CELEXA) 40 MG tablet Take 1 tablet (40 mg total) by mouth daily. 30 tablet 2    empagliflozin (JARDIANCE) 10 MG TABS tablet Take 1 tablet (10 mg total) by mouth daily. 30 tablet 2   lisinopril (ZESTRIL) 2.5 MG tablet TAKE 1 TABLET (2.5 MG TOTAL) BY MOUTH DAILY. PLEASE CONTACT OFFICE FOR ADDITIONAL REFILLS (Patient taking differently: Take 2.5 mg by mouth daily.) 90 tablet 2   metFORMIN (GLUCOPHAGE) 500 MG tablet TAKE 1 TABLET BY MOUTH 2 TIMES DAILY WITH A MEAL. 180 tablet 1   nitroGLYCERIN (NITROSTAT) 0.4 MG SL tablet PLACE 1 TABLET (0.4 MG TOTAL) UNDER THE TONGUE EVERY 5 (FIVE) MINUTES AS NEEDED FOR CHEST PAIN (MAY REPEAT X 3 THAN CALL 911). (Patient taking differently: Place 0.4 mg under the tongue every 5 (five) minutes as needed for chest pain.) 25 tablet 3   Omega-3 Fatty Acids (FISH OIL) 1000 MG CPDR Take 1,000 mg by mouth daily.     pantoprazole (PROTONIX) 40 MG tablet TAKE 1 TABLET BY MOUTH EVERY DAY (Patient taking differently: Take 40 mg by mouth every evening.) 90 tablet 0   rosuvastatin (CRESTOR) 40 MG tablet Take 1 tablet (40 mg total) by mouth daily. 30 tablet 6  ezetimibe (ZETIA) 10 MG tablet Take 1 tablet (10 mg total) by mouth daily. 90 tablet 3   No current facility-administered medications for this visit.     Review of Systems   He denies chest pain, palpitations, dyspnea, pnd, orthopnea, n, v, dizziness, syncope, edema, weight gain, or early satiety. All other systems reviewed and are otherwise negative except as noted above.   Physical Exam    VS:  BP 98/70 (BP Location: Left Arm, Patient Position: Sitting, Cuff Size: Normal)   Pulse 62   Ht 6\' 2"  (1.88 m)   Wt 220 lb (99.8 kg)   BMI 28.25 kg/m  GEN: Well nourished, well developed, in no acute distress. HEENT: normal. Neck: Supple, no JVD, carotid bruits, or masses. Cardiac: RRR, no murmurs, rubs, or gallops. No clubbing, cyanosis, edema.  Left radial cath site with minimal bruising, no bruit, bleeding, or hematoma.  Radials/DP/PT 2+ and equal bilaterally.  Respiratory:  Respirations regular and  unlabored, clear to auscultation bilaterally. GI: Soft, nontender, nondistended, BS + x 4. MS: no deformity or atrophy. Skin: warm and dry, no rash. Neuro:  Strength and sensation are intact. Psych: Normal affect.  Accessory Clinical Findings    ECG personally reviewed by me today -NSR, 62 bpm, LAD, PRWP- no acute changes.   Lab Results  Component Value Date   WBC 7.7 07/19/2022   HGB 14.6 07/19/2022   HCT 43.1 07/19/2022   MCV 91.9 07/19/2022   PLT 195 07/19/2022   Lab Results  Component Value Date   CREATININE 0.76 07/19/2022   BUN 11 07/19/2022   NA 136 07/19/2022   K 3.8 07/19/2022   CL 106 07/19/2022   CO2 19 (L) 07/19/2022   Lab Results  Component Value Date   ALT 43 06/30/2022   AST 40 (H) 06/30/2022   ALKPHOS 95 06/30/2022   BILITOT 0.8 06/30/2022   Lab Results  Component Value Date   CHOL 183 07/19/2022   HDL 46 07/19/2022   LDLCALC 116 (H) 07/19/2022   TRIG 103 07/19/2022   CHOLHDL 4.0 07/19/2022    Lab Results  Component Value Date   HGBA1C 6.0 (A) 05/15/2022    Assessment & Plan    1. CAD/elevated troponin: S/p DES-pLAD in 2010. Recent cath in 06/2022 revealed single-vessel CAD with continued patency of the proximal LAD stent, RCA with minimal to mild nonobstructive plaquing. Stable with no anginal symptoms.  Continue aspirin, carvedilol, lisinopril, Jardiance, and Crestor.  2. Chronic systolic heart failure/ICM: Most recent echo showed EF 35 to 40%, moderately decreased LV function, RWMA, G1 DD, mildly reduced RV systolic function, no significant valvular abnormalities., EF slightly reduced from prior. Euvolemic and well compensated on exam.  Will check BMET today given new Jardiance.  Further escalation of GDMT limited in the setting of hypotension.  Continue current medications as above.  3. Sinus bradycardia: Resolved with decreased carvedilol dosing.  4. Hypertension: BP well controlled, borderline though he is asymptomatic. Continue current  antihypertensive regimen.   5. Hyperlipidemia: LDL was 116 in 06/2022. LPA was 128.3.  He has a follow-up appointment scheduled with lipid clinic Pharm.D. today for consideration of further lipid-lowering therapy/PCSK9 inhibitor.  Continue aspirin, Crestor.  6. Type 2 diabetes: A1c was 6.2 in 12/2021. Monitored and managed per PCP.  7. Disposition: Follow-up in 4-6 months with Dr. Stanford Breed.      Lenna Sciara, NP 08/02/2022, 9:19 AM

## 2022-08-02 NOTE — Assessment & Plan Note (Addendum)
Assessment: Patient with ASCVD/familial hyperlipidemia not at LDL goal of < 55 Has been compliant with high intensity statin:  rosuvastatin 40 mg (increased from 20 mg last month)  Reviewed options for lowering LDL cholesterol  Plan: Patient agreeable to starting ezetimibe 10 mg daily Repeat labs after:  3 months Lipid Liver function

## 2022-08-16 ENCOUNTER — Ambulatory Visit (INDEPENDENT_AMBULATORY_CARE_PROVIDER_SITE_OTHER): Payer: Medicare Other | Admitting: Internal Medicine

## 2022-08-16 ENCOUNTER — Encounter: Payer: Self-pay | Admitting: Family Medicine

## 2022-08-16 ENCOUNTER — Other Ambulatory Visit (HOSPITAL_COMMUNITY): Payer: Self-pay

## 2022-08-16 ENCOUNTER — Other Ambulatory Visit (INDEPENDENT_AMBULATORY_CARE_PROVIDER_SITE_OTHER): Payer: Medicare Other

## 2022-08-16 ENCOUNTER — Encounter: Payer: Self-pay | Admitting: Internal Medicine

## 2022-08-16 VITALS — BP 112/80 | HR 63 | Ht 74.0 in | Wt 219.0 lb

## 2022-08-16 DIAGNOSIS — K7460 Unspecified cirrhosis of liver: Secondary | ICD-10-CM

## 2022-08-16 DIAGNOSIS — R634 Abnormal weight loss: Secondary | ICD-10-CM

## 2022-08-16 DIAGNOSIS — I255 Ischemic cardiomyopathy: Secondary | ICD-10-CM | POA: Diagnosis not present

## 2022-08-16 DIAGNOSIS — Z86010 Personal history of colonic polyps: Secondary | ICD-10-CM

## 2022-08-16 LAB — FERRITIN: Ferritin: 48.7 ng/mL (ref 22.0–322.0)

## 2022-08-16 NOTE — Patient Instructions (Addendum)
Your provider has requested that you go to the basement level for lab work before leaving today. Press "B" on the elevator. The lab is located at the first door on the left as you exit the elevator.  Due to recent changes in healthcare laws, you may see the results of your imaging and laboratory studies on MyChart before your provider has had a chance to review them.  We understand that in some cases there may be results that are confusing or concerning to you. Not all laboratory results come back in the same time frame and the provider may be waiting for multiple results in order to interpret others.  Please give us 48 hours in order for your provider to thoroughly review all the results before contacting the office for clarification of your results.   You have been scheduled for an endoscopy and colonoscopy. Please follow the written instructions given to you at your visit today. Please pick up your prep supplies at the pharmacy within the next 1-3 days. If you use inhalers (even only as needed), please bring them with you on the day of your procedure.  I appreciate the opportunity to care for you. Carl Gessner, MD, FACG 

## 2022-08-16 NOTE — Progress Notes (Signed)
Martin Frank 67 y.o. Oct 21, 1955 [id number]  Assessment & Plan:   Encounter Diagnoses  Name Primary?   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  This is now called MetALD.  He is doing all the right things by changing his diet and stopping alcohol.  He did have some weight loss it is probably from these 2 changes i.e. diet and alcohol change.  His platelets have been normal except for 1 transient subnormal reading in the last several months. Workup as below.    We will schedule a surveillance colonoscopy and given the weight loss and the cirrhosis diagnosis I think there is a relative indication for an EGD so we will pursue that at the same time.  The risks and benefits as well as alternatives of endoscopic procedure(s) have been discussed and reviewed. All questions answered. The patient agrees to proceed.  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  I had him do a CT of the abdomen and pelvis and we requested very old records to see the alpha-1 antitrypsin results.  There is also a history of suspected fatty liver.  Could not obtain any old lab records from his prior PCP.  Alpha-1 antitrypsin testing was done and is negative.  Other labs show that he is immune to hepatitis A but not hepatitis B  and has a negative hepatitis C test.   CT abdomen pelvis with contrast 07/04/2022 IMPRESSION: 1. Cirrhotic hepatic morphology without suspicious hepatic lesion. 2. Incompletely visualized clustered nodularity in the right lower lobe, likely reflecting an infectious or inflammatory process consider further evaluation with dedicated chest CT. 3. Prominent upper abdominal lymph nodes are nonspecific but likely reactive. Attention on follow-up imaging suggested. 4. Colonic diverticulosis without findings of acute diverticulitis. 5. Band of subendocardial hypodensity with calcific foci along the left ventricular septum and apex compatible with prior infarction. 6.  Aortic Atherosclerosis (ICD10-I70.0)  Subsequently on December 18 the patient had some chest pain issues and went to the hospital where he had a chest x-ray that was clear and a CT angio of the chest that was negative for any acute process or lung nodules though there was some respiratory motion that potentially limited lung parenchymal examination somewhat they were said.  Minute for a couple days.  Negative echocardiogram as well though reduced EF it was 39.8% (35 to 40%).  He had a cardiac cath vessel disease patent proximal LAD stent.  He feels well since.  The patient had lost weight and was 239 pounds May 2023 he has stopped drinking alcohol he was drinking 1 to 2 glasses of wine a day and has stopped eating processed especially ultra processed foods so much. Wt Readings from Last 3 Encounters:  08/16/22 219 lb (99.3 kg)  08/02/22 220 lb (99.8 kg)  07/19/22 214 lb (  97.1 kg)   Colonoscopy (1/21) = good prep, mild Sigmoid diverticulosis, medium internal hemorrhoids, and four sessile polyps were removed (three 42m TAs from [facility] and a 7mm non-polyp from [facility])   Allergies  Allergen Reactions   Bee Venom Anaphylaxis   Current Meds  Medication Sig   acetaminophen (TYLENOL) 500 MG tablet Take 500 mg by mouth 2 (two) times daily as  needed for moderate pain or headache.   aspirin EC 81 MG tablet Take 1 tablet (81 mg total) by mouth daily. Swallow whole.   busPIRone (BUSPAR) 10 MG tablet Take 1 tablet (10 mg total) by mouth 3 (three) times daily.   carvedilol (COREG) 3.125 MG tablet Take 1 tablet (3.125 mg total) by mouth 2 (two) times daily with a meal.   citalopram (CELEXA) 40 MG tablet Take 1 tablet (40 mg total) by mouth daily.   empagliflozin (JARDIANCE) 10 MG TABS tablet Take 1 tablet (10 mg total) by mouth daily.   ezetimibe (ZETIA) 10 MG tablet Take 1 tablet (10 mg total) by mouth daily.   lisinopril (ZESTRIL) 2.5 MG tablet TAKE 1 TABLET (2.5 MG TOTAL) BY MOUTH DAILY. PLEASE CONTACT OFFICE FOR ADDITIONAL REFILLS (Patient taking differently: Take 2.5 mg by mouth daily.)   metFORMIN (GLUCOPHAGE) 500 MG tablet TAKE 1 TABLET BY MOUTH 2 TIMES DAILY WITH A MEAL.   nitroGLYCERIN (NITROSTAT) 0.4 MG SL tablet PLACE 1 TABLET (0.4 MG TOTAL) UNDER THE TONGUE EVERY 5 (FIVE) MINUTES AS NEEDED FOR CHEST PAIN (MAY REPEAT X 3 THAN CALL 911). (Patient taking differently: Place 0.4 mg under the tongue every 5 (five) minutes as needed for chest pain.)   Omega-3 Fatty Acids (FISH OIL) 1000 MG CPDR Take 1,000 mg by mouth daily.   pantoprazole (PROTONIX) 40 MG tablet TAKE 1 TABLET BY MOUTH EVERY DAY (Patient taking differently: Take 40 mg by mouth every evening.)   rosuvastatin (CRESTOR) 40 MG tablet Take 1 tablet (40 mg total) by mouth daily.   Past Medical History:  Diagnosis Date   Anterior myocardial infarction [facility]) 11/2008   CAD (coronary artery disease)    remote anterior MI with VT and DES to the LAD in May of 2010; NSTEMI July 2014 in [place] with cath showing stent patency, EF of 35 to 40% and possible vasospasm versus potential small vessel disease.    Cardiomyopathy, ischemic 05/07/2013   Chronic ischemic heart disease 04/06/2013   Formatting of this note might be different from the original. STORY: 10/2008 and hospitalized  01/2013 with positive troponins but widely patent arteries on cath. Formatting of this note might be different from the original. STORY: 10/2008 and hospitalized 01/2013 with positive troponins but widely patent arteries on cath. Formatting of this note might be different from the original. STORY: 10/2008 and    Chronic systolic heart failure ([place])    Depression    Former smoker 12/12/2017   GERD (gastroesophageal reflux disease) 4 years   H/O heart artery stent 05/20/2019   Hx of cardiovascular stress test 2015   Lexiscan Myoview (11/2013):  Anteroseptal, apical anterior, apex, apical inferior scar; no ischemia, EF 50%; Intermediate Risk   Hyperlipidemia    Hypertension    Ischemic cardiomyopathy    a.Echo (11/2013):  EF 45-50%, apical HK   MI (myocardial infarction) ([place]) ? 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   Type II diabetes mellitus ([place])  Past Surgical History:  Procedure Laterality Date   CARDIAC CATHETERIZATION  X2   CARDIAC VALVE REPLACEMENT     COLONOSCOPY     Multiple-last 2021 history of polyps   CORONARY ANGIOGRAPHY N/A 07/19/2022   Procedure: CORONARY ANGIOGRAPHY;  Surgeon: Sherren Mocha, MD;  Location: [facility];  Service: Cardiovascular;  Laterality: N/A;   CORONARY ANGIOPLASTY WITH STENT PLACEMENT  5/2010X 2   "1+1" (12/05/2017)   LAPAROSCOPIC CHOLECYSTECTOMY     LEFT HEART CATH AND CORONARY ANGIOGRAPHY N/A 12/06/2017   Procedure: LEFT HEART CATH AND CORONARY ANGIOGRAPHY;  Surgeon: Lorretta Harp, MD;  Location: [facility];  Service: Cardiovascular;  Laterality: N/A;   ORCHIECTOMY Right 1960s   Social History   Social History Narrative   Married, 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   Review of Systems As per HPI  Objective:   Physical Exam BP 112/80   Pulse 63   Ht 6\' 2"  (1.88 m)   Wt 219 lb (99.3 kg)   BMI 28.12 kg/m  Eyes anicteric Lungs clear Heart sounds normal Abdomen soft nontender no organomegaly or mass Skin without stigmata of chronic liver disease Alert and oriented x 3

## 2022-08-18 LAB — ANTI-SMOOTH MUSCLE ANTIBODY, IGG: Actin (Smooth Muscle) Antibody (IGG): <20 U (ref <20)

## 2022-08-18 LAB — ANA: Anti Nuclear Antibody (ANA): POSITIVE — AB

## 2022-08-18 LAB — ANTI-NUCLEAR AB-TITER (ANA TITER): ANA Titer 1: 1:160 {titer} — ABNORMAL HIGH

## 2022-08-26 ENCOUNTER — Encounter: Payer: Self-pay | Admitting: Cardiology

## 2022-08-28 ENCOUNTER — Other Ambulatory Visit: Payer: Self-pay | Admitting: Family Medicine

## 2022-08-28 DIAGNOSIS — F331 Major depressive disorder, recurrent, moderate: Secondary | ICD-10-CM

## 2022-08-31 ENCOUNTER — Ambulatory Visit (AMBULATORY_SURGERY_CENTER): Payer: Medicare Other | Admitting: Internal Medicine

## 2022-08-31 ENCOUNTER — Encounter: Payer: Self-pay | Admitting: Internal Medicine

## 2022-08-31 VITALS — BP 126/74 | HR 52 | Temp 96.8°F | Resp 14 | Ht 74.0 in | Wt 219.0 lb

## 2022-08-31 DIAGNOSIS — K7460 Unspecified cirrhosis of liver: Secondary | ICD-10-CM | POA: Diagnosis not present

## 2022-08-31 DIAGNOSIS — Z09 Encounter for follow-up examination after completed treatment for conditions other than malignant neoplasm: Secondary | ICD-10-CM

## 2022-08-31 DIAGNOSIS — K3189 Other diseases of stomach and duodenum: Secondary | ICD-10-CM | POA: Diagnosis not present

## 2022-08-31 DIAGNOSIS — Z860100 Personal history of colon polyps, unspecified: Secondary | ICD-10-CM

## 2022-08-31 DIAGNOSIS — K5730 Diverticulosis of large intestine without perforation or abscess without bleeding: Secondary | ICD-10-CM | POA: Diagnosis not present

## 2022-08-31 DIAGNOSIS — K2970 Gastritis, unspecified, without bleeding: Secondary | ICD-10-CM | POA: Diagnosis not present

## 2022-08-31 MED ORDER — SODIUM CHLORIDE 0.9 % IV SOLN: 500.0000 mL | INTRAVENOUS | Status: DC

## 2022-08-31 NOTE — Progress Notes (Signed)
To pacu, VSS. Report to [email or web address] 

## 2022-08-31 NOTE — Progress Notes (Signed)
Pt's states no medical or surgical changes since previsit or office visit. 

## 2022-08-31 NOTE — Patient Instructions (Addendum)
The upper endoscopy showed inflammation in the stomach - biopsies taken and I will let you know what that tells [place].  Colonoscopy - no polyps but I saw diverticulosis ( thickened muscle rings and pouches in the colon wall) and hemorrhoids. Please read the handouts about these conditions.  I will have my office staff arrange a liver biopsy via interventional radiology, as discussed.  I appreciate the opportunity to care for you. Gatha Mayer, MD, FACG  YOU HAD AN ENDOSCOPIC PROCEDURE TODAY AT [facility]:   Refer to the procedure report that was given to you for any specific questions about what was found during the examination.  If the procedure report does not answer your questions, please call your gastroenterologist to clarify.  If you requested that your care partner not be given the details of your procedure findings, then the procedure report has been included in a sealed envelope for you to review at your convenience later.  YOU SHOULD EXPECT: Some feelings of bloating in the abdomen. Passage of more gas than usual.  Walking can help get rid of the air that was put into your GI tract during the procedure and reduce the bloating. If you had a lower endoscopy (such as a colonoscopy or flexible sigmoidoscopy) you may notice spotting of blood in your stool or on the toilet paper. If you underwent a bowel prep for your procedure, you may not have a normal bowel movement for a few days.  Please Note:  You might notice some irritation and congestion in your nose or some drainage.  This is from the oxygen used during your procedure.  There is no need for concern and it should clear up in a day or so.  SYMPTOMS TO REPORT IMMEDIATELY:  Following lower endoscopy (colonoscopy or flexible sigmoidoscopy):  Excessive amounts of blood in the stool  Significant tenderness or worsening of abdominal pains  Swelling of the abdomen that is new, acute  Fever of 100F or higher  Following  upper endoscopy (EGD)  Vomiting of blood or coffee ground material  New chest pain or pain under the shoulder blades  Painful or persistently difficult swallowing  New shortness of breath  Fever of 100F or higher  Black, tarry-looking stools  For urgent or emergent issues, a gastroenterologist can be reached at any hour by calling [phone]. Do not use MyChart messaging for urgent concerns.    DIET:  We do recommend a small meal at first, but then you may proceed to your regular diet.  Drink plenty of fluids but you should avoid alcoholic beverages for 24 hours.  ACTIVITY:  You should plan to take it easy for the rest of today and you should NOT DRIVE or use heavy machinery until tomorrow (because of the sedation medicines used during the test).    FOLLOW UP: Our staff will call the number listed on your records the next business day following your procedure.  We will call around 7:15- 8:00 am to check on you and address any questions or concerns that you may have regarding the information given to you following your procedure. If we do not reach you, we will leave a message.     If any biopsies were taken you will be contacted by phone or by letter within the next 1-3 weeks.  Please call us at [phone] if you have not heard about the biopsies in 3 weeks.    SIGNATURES/CONFIDENTIALITY: You and/or your care partner have signed  paperwork which will be entered into your electronic medical record.  These signatures attest to the fact that that the information above on your After Visit Summary has been reviewed and is understood.  Full responsibility of the confidentiality of this discharge information lies with you and/or your care-partner.

## 2022-08-31 NOTE — Progress Notes (Signed)
History and Physical Interval Note:  08/31/2022 2:15 PM  Martin Frank  has presented today for endoscopic procedure(s), with the diagnosis of  Encounter Diagnoses  Name Primary?   Hepatic cirrhosis, unspecified hepatic cirrhosis type, unspecified whether ascites present ([place]) Yes   History of colonic polyps   .  The various methods of evaluation and treatment have been discussed with the patient and/or family. After consideration of risks, benefits and other options for treatment, the patient has consented to  the endoscopic procedure(s).   The patient's history has been reviewed, patient examined, no change in status, stable for endoscopic procedure(s).  I have reviewed the patient's chart and labs.  Questions were answered to the patient's satisfaction.     Gatha Mayer, MD, [facility]

## 2022-08-31 NOTE — Progress Notes (Signed)
Called to room to assist during endoscopic procedure.  Patient ID and intended procedure confirmed with present staff. Received instructions for my participation in the procedure from the performing physician.  

## 2022-08-31 NOTE — Op Note (Signed)
[name]: Martin Frank Procedure Date: 08/31/2022 2:16 PM MRN: [id number] Endoscopist: Gatha Mayer , MD, [id number] Age: 67 Referring MD:  Date of Birth: 11-Jan-1956 Gender: Male Account #: [id number] Procedure:                Upper GI endoscopy Indications:              Cirrhosis rule out esophageal varices Medicines:                Monitored Anesthesia Care Procedure:                Pre-Anesthesia Assessment:                           - Prior to the procedure, a History and Physical                            was performed, and patient medications and                            allergies were reviewed. The patient's tolerance of                            previous anesthesia was also reviewed. The risks                            and benefits of the procedure and the sedation                            options and risks were discussed with the patient.                            All questions were answered, and informed consent                            was obtained. Prior Anticoagulants: The patient has                            taken no anticoagulant or antiplatelet agents. ASA                            Grade Assessment: III - A patient with severe                            systemic disease. After reviewing the risks and                            benefits, the patient was deemed in satisfactory                            condition to undergo the procedure.                           After obtaining informed consent, the endoscope was  passed under direct vision. Throughout the                            procedure, the patient's blood pressure, pulse, and                            oxygen saturations were monitored continuously. The                            Olympus Scope [id number] was introduced through the                            mouth, and advanced to the second part of duodenum.                            The upper GI  endoscopy was accomplished without                            difficulty. The patient tolerated the procedure                            well. Scope In: Scope Out: Findings:                 Patchy moderate inflammation characterized by                            congestion (edema) and erythema was found in the                            gastric antrum. Biopsies were taken with a cold                            forceps for histology. Verification of patient                            identification for the specimen was done. Estimated                            blood loss was minimal.                           The exam was otherwise without abnormality.                           The cardia and gastric fundus were normal on                            retroflexion. Complications:            No immediate complications. Estimated Blood Loss:     Estimated blood loss was minimal. Impression:               - Gastritis. (portal gastropathy) Biopsied.                           -  The examination was otherwise normal. No varices                            or clear signs portal hypertension (? antral                            changes could be portal gastropathy) Recommendation:           - Patient has a contact number available for                            emergencies. The signs and symptoms of potential                            delayed complications were discussed with the                            patient. Return to normal activities tomorrow.                            Written discharge instructions were provided to the                            patient.                           - Resume previous diet.                           - Continue present medications.                           - See the other procedure note for documentation of                            additional recommendations. Gatha Mayer, MD 08/31/2022 2:52:13 PM This report has been signed electronically.

## 2022-08-31 NOTE — Op Note (Signed)
[facility] Patient Name: Martin Frank Procedure Date: 08/31/2022 2:16 PM MRN: [id number] Endoscopist: Gatha Mayer , MD, [id number] Age: 67 Referring MD:  Date of Birth: [birth year] Gender: Male Account #: [id number] Procedure:                Colonoscopy Indications:              Surveillance: Personal history of adenomatous                            polyps on last colonoscopy 3 years ago Medicines:                Monitored Anesthesia Care Procedure:                Pre-Anesthesia Assessment:                           - Prior to the procedure, a History and Physical                            was performed, and patient medications and                            allergies were reviewed. The patient's tolerance of                            previous anesthesia was also reviewed. The risks                            and benefits of the procedure and the sedation                            options and risks were discussed with the patient.                            All questions were answered, and informed consent                            was obtained. Prior Anticoagulants: The patient has                            taken no anticoagulant or antiplatelet agents. ASA                            Grade Assessment: III - A patient with severe                            systemic disease. After reviewing the risks and                            benefits, the patient was deemed in satisfactory                            condition to undergo the procedure.  After obtaining informed consent, the colonoscope                            was passed under direct vision. Throughout the                            procedure, the patient's blood pressure, pulse, and                            oxygen saturations were monitored continuously. The                            Olympus [id number] was introduced through the anus                            and advanced to the  the cecum, identified by                            appendiceal orifice and ileocecal valve. The                            colonoscopy was performed without difficulty. The                            patient tolerated the procedure well. The quality                            of the bowel preparation was excellent. The                            ileocecal valve, appendiceal orifice, and rectum                            were photographed. The bowel preparation used was                            Miralax via split dose instruction. Scope In: 2:26:02 PM Scope Out: 2:40:02 PM Scope Withdrawal Time: 0 hours 9 minutes 3 seconds  Total Procedure Duration: 0 hours 14 minutes 0 seconds  Findings:                 The perianal and digital rectal examinations were                            normal.                           Multiple diverticula were found in the sigmoid                            colon.                           Internal hemorrhoids were found.  The exam was otherwise without abnormality on                            direct and retroflexion views. Complications:            No immediate complications. Estimated Blood Loss:     Estimated blood loss: none. Impression:               - Diverticulosis in the sigmoid colon.                           - Internal hemorrhoids.                           - The examination was otherwise normal on direct                            and retroflexion views.                           - No specimens collected.                           - Personal history of colonic polyps. 3 5 mm                            adenomas 08/2019 Recommendation:           - Patient has a contact number available for                            emergencies. The signs and symptoms of potential                            delayed complications were discussed with the                            patient. Return to normal activities tomorrow.                             Written discharge instructions were provided to the                            patient.                           - Resume previous diet.                           - Continue present medications.                           - Repeat colonoscopy in 5 years for surveillance. Gatha Mayer, MD 08/31/2022 2:54:47 PM This report has been signed electronically.

## 2022-09-01 ENCOUNTER — Telehealth: Payer: Self-pay

## 2022-09-01 ENCOUNTER — Other Ambulatory Visit: Payer: Self-pay

## 2022-09-01 ENCOUNTER — Telehealth: Payer: Self-pay | Admitting: *Deleted

## 2022-09-01 DIAGNOSIS — R768 Other specified abnormal immunological findings in serum: Secondary | ICD-10-CM

## 2022-09-01 DIAGNOSIS — R948 Abnormal results of function studies of other organs and systems: Secondary | ICD-10-CM

## 2022-09-01 DIAGNOSIS — K7460 Unspecified cirrhosis of liver: Secondary | ICD-10-CM

## 2022-09-01 NOTE — Telephone Encounter (Unsigned)
Order placed in Epic Staff Message sent to Rhys Martini and April Pait to schedule ultrasound-guided liver biopsy - dx cirrhosis and abnormal ANA Pt made aware. Pt verbalized understanding with all questions answered.

## 2022-09-01 NOTE — Telephone Encounter (Signed)
  Follow up Call-     08/31/2022    1:16 PM  Call back number  Post procedure Call Back phone  # [phone]  Permission to leave phone message Yes     Patient questions:  Do you have a fever, pain , or abdominal swelling? No. Pain Score  0 *  Have you tolerated food without any problems? Yes.    Have you been able to return to your normal activities? Yes.    Do you have any questions about your discharge instructions: Diet   No. Medications  No. Follow up visit  No.  Do you have questions or concerns about your Care? No.  Actions: * If pain score is 4 or above: No action needed, pain <4.

## 2022-09-01 NOTE — Telephone Encounter (Unsigned)
liver biopsy Received: Lorra Hals, Ofilia Neas, MD  Gillermina Hu, RN Remo Lipps,  Please arrange for ultrasound-guided liver biopsy - dx cirrhosis and abnormal ANA  Thanks  CEG

## 2022-09-07 ENCOUNTER — Telehealth: Payer: Self-pay | Admitting: General Practice

## 2022-09-07 ENCOUNTER — Other Ambulatory Visit: Payer: Self-pay | Admitting: Family Medicine

## 2022-09-07 ENCOUNTER — Encounter: Payer: Self-pay | Admitting: General Practice

## 2022-09-07 DIAGNOSIS — F331 Major depressive disorder, recurrent, moderate: Secondary | ICD-10-CM

## 2022-09-07 MED ORDER — CITALOPRAM HYDROBROMIDE 40 MG PO TABS: 40.0000 mg | ORAL_TABLET | Freq: Every day | ORAL | 0 refills | Status: DC

## 2022-09-07 NOTE — Telephone Encounter (Signed)
Pt called and asked to speak to the head person. He said he had been seen by the jack--s. He said that he refused to give him a refill for his medication, even though he had chosen to stop seeing Dr Marcello Moores.

## 2022-09-07 NOTE — Telephone Encounter (Signed)
Martin Frank speaking with patient on the phone currently. Rx refill for 30 days only per Dr. Grandville Silos. Please place rx order

## 2022-09-07 NOTE — Telephone Encounter (Signed)
Advised patient this would be the last refill "Celexa".  We will send information via MyChart to help him find another PCP.  I wished him well, and conversation ended.

## 2022-09-07 NOTE — Addendum Note (Signed)
Addended by: Bonnita Hollow on: 09/07/2022 05:04 PM   Modules accepted: Orders

## 2022-09-13 ENCOUNTER — Other Ambulatory Visit: Payer: Self-pay | Admitting: Radiology

## 2022-09-13 DIAGNOSIS — K7460 Unspecified cirrhosis of liver: Secondary | ICD-10-CM

## 2022-09-14 ENCOUNTER — Ambulatory Visit (HOSPITAL_COMMUNITY)
Admission: RE | Admit: 2022-09-14 | Discharge: 2022-09-14 | Disposition: A | Discharge status: Home / Self Care | Payer: Medicare Other | Source: Ambulatory Visit | Attending: Internal Medicine | Admitting: Internal Medicine

## 2022-09-14 ENCOUNTER — Other Ambulatory Visit: Payer: Self-pay

## 2022-09-14 DIAGNOSIS — I2510 Atherosclerotic heart disease of native coronary artery without angina pectoris: Secondary | ICD-10-CM | POA: Insufficient documentation

## 2022-09-14 DIAGNOSIS — I10 Essential (primary) hypertension: Secondary | ICD-10-CM | POA: Insufficient documentation

## 2022-09-14 DIAGNOSIS — F32A Depression, unspecified: Secondary | ICD-10-CM | POA: Insufficient documentation

## 2022-09-14 DIAGNOSIS — R768 Other specified abnormal immunological findings in serum: Secondary | ICD-10-CM | POA: Insufficient documentation

## 2022-09-14 DIAGNOSIS — K219 Gastro-esophageal reflux disease without esophagitis: Secondary | ICD-10-CM | POA: Insufficient documentation

## 2022-09-14 DIAGNOSIS — Z7984 Long term (current) use of oral hypoglycemic drugs: Secondary | ICD-10-CM | POA: Diagnosis not present

## 2022-09-14 DIAGNOSIS — K7460 Unspecified cirrhosis of liver: Secondary | ICD-10-CM | POA: Diagnosis not present

## 2022-09-14 DIAGNOSIS — I252 Old myocardial infarction: Secondary | ICD-10-CM | POA: Diagnosis not present

## 2022-09-14 DIAGNOSIS — K739 Chronic hepatitis, unspecified: Secondary | ICD-10-CM | POA: Diagnosis not present

## 2022-09-14 DIAGNOSIS — E118 Type 2 diabetes mellitus with unspecified complications: Secondary | ICD-10-CM | POA: Diagnosis not present

## 2022-09-14 DIAGNOSIS — E785 Hyperlipidemia, unspecified: Secondary | ICD-10-CM | POA: Insufficient documentation

## 2022-09-14 LAB — CBC
HCT: 42.3 % (ref 39.0–52.0)
Hemoglobin: 14.4 g/dL (ref 13.0–17.0)
MCH: 31.4 pg (ref 26.0–34.0)
MCHC: 34 g/dL (ref 30.0–36.0)
MCV: 92.4 fL (ref 80.0–100.0)
Platelets: 171 10*3/uL (ref 150–400)
RBC: 4.58 MIL/uL (ref 4.22–5.81)
RDW: 13.1 % (ref 11.5–15.5)
WBC: 5 10*3/uL (ref 4.0–10.5)
nRBC: 0 % (ref 0.0–0.2)

## 2022-09-14 LAB — GLUCOSE, CAPILLARY
Glucose-Capillary: 96 mg/dL (ref 70–99)
Glucose-Capillary: 97 mg/dL (ref 70–99)

## 2022-09-14 LAB — PROTIME-INR
INR: 1.1 (ref 0.8–1.2)
Prothrombin Time: 13.8 seconds (ref 11.4–15.2)

## 2022-09-14 MED ORDER — SODIUM CHLORIDE 0.9 % IV SOLN: INTRAVENOUS | Status: DC

## 2022-09-14 MED ORDER — MIDAZOLAM HCL 2 MG/2ML IJ SOLN
INTRAMUSCULAR | Status: AC | PRN
  Administered 2022-09-14: 1 mg via INTRAVENOUS

## 2022-09-14 MED ORDER — FENTANYL CITRATE (PF) 100 MCG/2ML IJ SOLN
INTRAMUSCULAR | Status: AC | PRN
  Administered 2022-09-14: 50 ug via INTRAVENOUS

## 2022-09-14 MED ORDER — MIDAZOLAM HCL 2 MG/2ML IJ SOLN
INTRAMUSCULAR | Status: AC
  Filled 2022-09-14: qty 2

## 2022-09-14 MED ORDER — SODIUM CHLORIDE 0.9 % IV SOLN
INTRAVENOUS | Status: AC | PRN
  Administered 2022-09-14: 10 mL/h via INTRAVENOUS

## 2022-09-14 MED ORDER — LIDOCAINE HCL (PF) 1 % IJ SOLN
7.0000 mL | Freq: Once | INTRAMUSCULAR | Status: AC
  Administered 2022-09-14: 7 mL via INTRADERMAL

## 2022-09-14 MED ORDER — FENTANYL CITRATE (PF) 100 MCG/2ML IJ SOLN
INTRAMUSCULAR | Status: AC
  Filled 2022-09-14: qty 2

## 2022-09-14 MED ORDER — GELATIN ABSORBABLE 12-7 MM EX MISC: 1.0000 | Freq: Once | CUTANEOUS | Status: DC

## 2022-09-14 NOTE — H&P (Signed)
Chief Complaint: Patient was seen in consultation today for cirrhosis/abnormal ANA  Referring Physician(s): Gessner,Carl E  Supervising Physician: Corrie Mckusick  Patient Status: [facility] - Out-pt  History of Present Illness: [facility] is a 67 y.o. male with a past medical history significant for depression, GERD, HTN, HLD, CAD, cardiomyopathy, MI, DM, cirrhosis and abnormal ANA who presents today for a random liver biopsy. [facility] has been followed by GI for suspected fatty liver disease and abnormal weight loss. During workup he was noted to have an abnormal ANA. He underwent EGD/colonoscopy on 08/31/22 which showed patchy moderate inflammation characterized by congestion and erythema in the gastric antrum and multiple diverticula in the sigmoid colon. He has been referred to IR for a random liver biopsy to further evaluate cirrhosis diagnosis.  Past Medical History:  Diagnosis Date   Anterior myocardial infarction [facility]) 11/2008   CAD (coronary artery disease)    remote anterior MI with VT and DES to the LAD in May of 2010; NSTEMI July 2014 in [place] with cath showing stent patency, EF of 35 to 40% and possible vasospasm versus potential small vessel disease.    Cardiomyopathy, ischemic 05/07/2013   Chronic ischemic heart disease 04/06/2013   Formatting of this note might be different from the original. STORY: 10/2008 and hospitalized 01/2013 with positive troponins but widely patent arteries on cath. Formatting of this note might be different from the original. STORY: 10/2008 and hospitalized 01/2013 with positive troponins but widely patent arteries on cath. Formatting of this note might be different from the original. STORY: 10/2008 and    Chronic systolic heart failure ([place])    Depression    Former smoker 12/12/2017   GERD (gastroesophageal reflux disease) 4 years   H/O heart artery stent 05/20/2019   Hx of cardiovascular stress test 2015   Lexiscan Myoview (11/2013):  Anteroseptal,  apical anterior, apex, apical inferior scar; no ischemia, EF 50%; Intermediate Risk   Hyperlipidemia    Hypertension    Ischemic cardiomyopathy    a.Echo (11/2013):  EF 45-50%, apical HK   MI (myocardial infarction) ([place]) ? 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   Type II diabetes mellitus [facility])     Past Surgical History:  Procedure Laterality Date   CARDIAC CATHETERIZATION  X2   CARDIAC VALVE REPLACEMENT     COLONOSCOPY     Multiple-last 2021 history of polyps   CORONARY ANGIOGRAPHY N/A 07/19/2022   Procedure: CORONARY ANGIOGRAPHY;  Surgeon: Sherren Mocha, MD;  Location: [place] [facility];  Service: Cardiovascular;  Laterality: N/A;   CORONARY ANGIOPLASTY WITH STENT PLACEMENT  5/2010X 2   "1+1" (12/05/2017)   LAPAROSCOPIC CHOLECYSTECTOMY     LEFT HEART CATH AND CORONARY ANGIOGRAPHY N/A 12/06/2017   Procedure: LEFT HEART CATH AND CORONARY ANGIOGRAPHY;  Surgeon: Lorretta Harp, MD;  Location: [place] [facility];  Service: Cardiovascular;  Laterality: N/A;   ORCHIECTOMY Right 1960s    Allergies: Bee venom  Medications: Prior to Admission medications   Medication Sig Start Date End Date Taking? Authorizing Provider  acetaminophen (TYLENOL) 500 MG tablet Take 500 mg by mouth 2 (two) times daily as needed for moderate pain or headache.   Yes [provider]  carvedilol (COREG) 3.125 MG tablet Take 1 tablet (3.125 mg total) by mouth 2 (two) times daily with a meal. 07/19/22  Yes Cheryln Manly,  NP  citalopram (CELEXA) 40 MG tablet Take 1 tablet (40 mg total) by mouth daily. 09/07/22 10/07/22 Yes Bonnita Hollow, MD  empagliflozin (JARDIANCE) 10 MG TABS tablet Take 1 tablet (10 mg total) by mouth daily. 07/19/22  Yes Reino Bellis B, NP  ezetimibe (ZETIA) 10 MG tablet Take 1 tablet (10 mg total) by mouth daily. 08/02/22  Yes Lelon Perla, MD  lisinopril (ZESTRIL) 2.5 MG tablet  TAKE 1 TABLET (2.5 MG TOTAL) BY MOUTH DAILY. PLEASE CONTACT OFFICE FOR ADDITIONAL REFILLS Patient taking differently: Take 2.5 mg by mouth daily. 06/16/22 01/12/23 Yes Bonnita Hollow, MD  metFORMIN (GLUCOPHAGE) 500 MG tablet TAKE 1 TABLET BY MOUTH 2 TIMES DAILY WITH A MEAL. 08/01/22  Yes McElwee, Lauren A, NP  Omega-3 Fatty Acids (FISH OIL) 1000 MG CPDR Take 1,000 mg by mouth daily.   Yes [provider]  pantoprazole (PROTONIX) 40 MG tablet TAKE 1 TABLET BY MOUTH EVERY DAY Patient taking differently: Take 40 mg by mouth every evening. 06/26/22  Yes Bonnita Hollow, MD  rosuvastatin (CRESTOR) 40 MG tablet Take 1 tablet (40 mg total) by mouth daily. 12/13/21  Yes Bonnita Hollow, MD  aspirin EC 81 MG tablet Take 1 tablet (81 mg total) by mouth daily. Swallow whole. 07/20/22   Cheryln Manly, NP  nitroGLYCERIN (NITROSTAT) 0.4 MG SL tablet PLACE 1 TABLET (0.4 MG TOTAL) UNDER THE TONGUE EVERY 5 (FIVE) MINUTES AS NEEDED FOR CHEST PAIN (MAY REPEAT X 3 THAN CALL 911). 02/02/17   Lelon Perla, MD     Family History  Problem Relation Age of Onset   Diabetes Mother    Rheum arthritis Mother    CAD Father        MI at age 26   CAD Brother        MI at age 66   Post-traumatic stress disorder Brother    Suicidality Brother    Cancer Brother 38       Brain Tumor   Diabetes Brother    Atrial fibrillation Son    Stomach cancer Neg Hx    Colon cancer Neg Hx    Esophageal cancer Neg Hx     Social History   Socioeconomic History   Marital status: Married    Spouse name: Not on file   Number of children: 2   Years of education: Not on file   Highest education level: Not on file  Occupational History   Occupation: Occupational psychologist    Comment: Youth worker   Occupation: Press photographer  Tobacco Use   Smoking status: Former    Packs/day: 1.00    Years: 10.00    Total pack years: 10.00    Types: Cigarettes    Quit date: 07/31/1981    Years since quitting: 41.1   Smokeless tobacco:  Never  Vaping Use   Vaping Use: Never used  Substance and Sexual Activity   Alcohol use: Not Currently    Alcohol/week: 3.0 standard drinks of alcohol    Types: 3 Glasses of wine per week   Drug use: Not Currently    Types: Marijuana    Comment: 12/05/2017 "nothing since my early 20's"   Sexual activity: Yes  Other Topics Concern   Not on file  Social History Narrative   Married, 24 years as of 2023   Langley Gauss   Employed in [place] currently [place]   Prior alcohol approximately 7 to 10/week maximum stopped in October 2020  5 caffeinated beverages daily   Former smoker no current tobacco or drug use   Social Determinants of Radio broadcast assistant Strain: Not on file  Food Insecurity: Not on file  Transportation Needs: Not on file  Physical Activity: Not on file  Stress: Not on file  Social Connections: Not on file     Review of Systems: A 12 point ROS discussed and pertinent positives are indicated in the HPI above.  All other systems are negative.  Review of Systems  Constitutional:  Negative for chills and fever.  Respiratory:  Negative for cough and shortness of breath.   Cardiovascular:  Negative for chest pain.  Gastrointestinal:  Negative for abdominal pain, diarrhea, nausea and vomiting.  Musculoskeletal:  Negative for back pain.  Neurological:  Negative for dizziness and headaches.    Vital Signs: BP 131/86   Pulse (!) 53   Temp 97.6 F (36.4 C) (Temporal)   Resp 15   Ht 6' 2"$  (1.88 m)   Wt 215 lb (97.5 kg)   SpO2 97%   BMI 27.60 kg/m   Physical Exam Vitals and nursing note reviewed.  Constitutional:      General: He is not in acute distress.    Appearance: He is not ill-appearing.  HENT:     Head: Normocephalic.     Mouth/Throat:     Mouth: Mucous membranes are moist.     Pharynx: Oropharynx is clear. No oropharyngeal exudate or posterior oropharyngeal erythema.  Cardiovascular:     Rate and Rhythm: Normal rate and regular  rhythm.  Pulmonary:     Effort: Pulmonary effort is normal.     Breath sounds: Normal breath sounds.  Abdominal:     General: There is no distension.     Palpations: Abdomen is soft.     Tenderness: There is no abdominal tenderness.  Skin:    General: Skin is warm and dry.  Neurological:     Mental Status: He is alert and oriented to person, place, and time.  Psychiatric:        Mood and Affect: Mood normal.        Thought Content: Thought content normal.        Judgment: Judgment normal.      MD Evaluation Airway: WNL Heart: WNL Abdomen: WNL Chest/ Lungs: WNL ASA  Classification: 3 Mallampati/Airway Score: Two   Imaging: No results found.  Labs:  CBC: Recent Labs    06/30/22 1038 07/17/22 1657 07/19/22 0203 09/14/22 1153  WBC 6.8 8.1 7.7 5.0  HGB 15.5 14.3 14.6 14.4  HCT 45.4 41.9 43.1 42.3  PLT 186.0 259 195 171    COAGS: Recent Labs    06/30/22 1038 09/14/22 1153  INR 1.1* 1.1    BMP: Recent Labs    06/30/22 1038 07/17/22 1657 07/19/22 0203 08/02/22 0839  NA 138 135 136 138  K 5.1 4.2 3.8 4.6  CL 102 103 106 103  CO2 28 25 19* 24  GLUCOSE 111* 150* 98 126*  BUN 11 13 11 12  $ CALCIUM 10.0 9.1 8.8* 9.8  CREATININE 0.85 0.92 0.76 0.94  GFRNONAA  --  >60 >60  --     LIVER FUNCTION TESTS: Recent Labs    01/10/22 0829 02/13/22 0855 06/30/22 1038  BILITOT 0.8 0.8 0.8  AST 38* 54* 40*  ALT 32 55* 43  ALKPHOS 88 122* 95  PROT 6.8 6.9 7.5  ALBUMIN 4.0 4.2 4.4    TUMOR MARKERS: Recent Labs  07/06/22 1549  AFPTM 4.0    Assessment and Plan:  67 y/o M with history of cirrhosis of unknown etiology who presents today for a random liver biopsy to further evaluate.  Patient has been NPO since midnight, last dose ASA 2/7.  Risks and benefits of random liver biopsy was discussed with the patient and/or patient's family including, but not limited to bleeding, infection, damage to adjacent structures or low yield requiring additional  tests.  All of the questions were answered and there is agreement to proceed.  Consent signed and in chart.   Thank you for this interesting consult.  I greatly enjoyed meeting RAEQUAN QUARTUCCIO and look forward to participating in their care.  A copy of this report was sent to the requesting provider on this date.  Electronically Signed: Joaquim Nam, PA-C 09/14/2022, 1:06 PM   I spent a total of  30 Minutes  in face to face in clinical consultation, greater than 50% of which was counseling/coordinating care for cirrhosis.

## 2022-09-14 NOTE — Procedures (Signed)
Interventional Radiology Procedure Note  Procedure: [place] RANDOM RT LIVER CORE BX    Complications: None  Estimated Blood Loss:  MIN  Findings: 84 G CORE X 2    M. Daryll Brod, MD

## 2022-09-14 NOTE — Progress Notes (Signed)
Patient and wife was given discharge instructions. Both verbalized understanding. 

## 2022-09-22 ENCOUNTER — Encounter: Payer: Self-pay | Admitting: Internal Medicine

## 2022-09-26 ENCOUNTER — Encounter: Payer: Self-pay | Admitting: Internal Medicine

## 2022-09-27 LAB — SURGICAL PATHOLOGY

## 2022-09-28 ENCOUNTER — Encounter: Payer: Self-pay | Admitting: Family Medicine

## 2022-09-28 ENCOUNTER — Encounter: Payer: Self-pay | Admitting: Internal Medicine

## 2022-09-28 ENCOUNTER — Ambulatory Visit (INDEPENDENT_AMBULATORY_CARE_PROVIDER_SITE_OTHER): Payer: Medicare Other | Admitting: Family Medicine

## 2022-09-28 ENCOUNTER — Telehealth: Payer: Self-pay | Admitting: Internal Medicine

## 2022-09-28 VITALS — BP 116/68 | HR 65 | Temp 98.9°F | Resp 18 | Ht 74.0 in | Wt 217.2 lb

## 2022-09-28 DIAGNOSIS — K7469 Other cirrhosis of liver: Secondary | ICD-10-CM

## 2022-09-28 DIAGNOSIS — E1169 Type 2 diabetes mellitus with other specified complication: Secondary | ICD-10-CM

## 2022-09-28 DIAGNOSIS — F331 Major depressive disorder, recurrent, moderate: Secondary | ICD-10-CM | POA: Diagnosis not present

## 2022-09-28 DIAGNOSIS — I2510 Atherosclerotic heart disease of native coronary artery without angina pectoris: Secondary | ICD-10-CM | POA: Diagnosis not present

## 2022-09-28 DIAGNOSIS — K219 Gastro-esophageal reflux disease without esophagitis: Secondary | ICD-10-CM

## 2022-09-28 DIAGNOSIS — I10 Essential (primary) hypertension: Secondary | ICD-10-CM | POA: Diagnosis not present

## 2022-09-28 DIAGNOSIS — I2583 Coronary atherosclerosis due to lipid rich plaque: Secondary | ICD-10-CM | POA: Diagnosis not present

## 2022-09-28 DIAGNOSIS — E785 Hyperlipidemia, unspecified: Secondary | ICD-10-CM

## 2022-09-28 DIAGNOSIS — K7460 Unspecified cirrhosis of liver: Secondary | ICD-10-CM

## 2022-09-28 MED ORDER — ROSUVASTATIN CALCIUM 40 MG PO TABS: 40.0000 mg | ORAL_TABLET | Freq: Every day | ORAL | 3 refills | Status: DC

## 2022-09-28 NOTE — Assessment & Plan Note (Signed)
Currently on rosuvastatin, Omega-3, Zetia Declined labs today Lifestyle factors for lowering cholesterol include: Diet therapy - heart-healthy diet rich in fruits, veggies, fiber-rich whole grains, lean meats, chicken, fish (at least twice a week), fat-free or 1% dairy products; foods low in saturated/trans fats, cholesterol, sodium, and sugar. Mediterranean diet has shown to be very heart healthy. Regular exercise - recommend at least 30 minutes a day, 5 times per week Weight management

## 2022-09-28 NOTE — Assessment & Plan Note (Signed)
Currently on Celexa Denies active SI/HI, but passive thoughts present at times Refused referral for [facility] med management and counseling Patient aware of signs/symptoms requiring further/urgent evaluation.

## 2022-09-28 NOTE — Assessment & Plan Note (Signed)
Previously followed with cardiology, but states he does not plan to continue Currently taking coreg, lisinopril, aspirin, rosuvastatin Refused labs

## 2022-09-28 NOTE — Progress Notes (Signed)
New Patient Office Visit  Subjective    Patient ID: Martin Frank, male    DOB: 1955/10/23  Age: 67 y.o. MRN: [id number]  CC:  Chief Complaint  Patient presents with   New Patient (Initial Visit)    Concerns/ questions: none AWV due Eye exam: not established Tdap, Shingrix- to get at the pharmacy    HPI Martin Frank presents to establish care. He was previously a patient at [facility] He works in Press photographer. He lives with his wife. He has 2 grown sons who are married with children.   Patient reports he is here to establish care and be with our office "for a little while." Reports that he has lost hope in the healthcare system and is trying to decide how he wants to proceed with his personal health. Admits that he recently ended relationship with previous PCP at [facility] prematurely as he now needs someone to continue his prescriptions. Reports that he is following with GI for liver concerns and once he figures out what is going on there, he will make a decision if he wants to continue all of his medications or stop and focus on lifestyle measures only. States that he was following with cardiology but does not plan to continue seeing them for future care.     Hypertension: - Medications: carvedilol 3.125 mg BID, lisinopril 2.5 mg daily - Compliance: good - Checking BP at home: no - Denies any SOB, recurrent headaches, CP, vision changes, LE edema, dizziness, palpitations, or medication side effects.  Diabetes: - Checking BG at home: no - Medications: metformin 500 mg BID (He was recently prescribed Jardiance, but he does not feel that he needs it and it is expensive. He is not interested in seeking alternatives). - Compliance: good - Eye exam: declined, refused referral - Foot exam: June 2023 - Microalbumin: July 2023 - Denies symptoms of hypoglycemia, polyuria, polydipsia, numbness extremities, foot ulcers/trauma, wounds that are not healing, medication side effects  Lab  Results  Component Value Date   HGBA1C 6.0 (A) 05/15/2022   Mood disorder: - Diagnosis:depression - Treatment: Celexa - Medication side effects: non - SI/HI: occasional SI, none in the past 2 weeks - Update: States Celexa is not working but he is not interested in [facility] referral or counseling. Does not want me to make any medication changes. Reports that he doesn't have any active plan to harm himself or others.   Hyperlipidemia: - medications: rosuvastatin 40 mg daily, Omega 3, Zetia - compliance: good - medication SEs: no The ASCVD Risk score (Arnett DK, et al., 2019) failed to calculate for the following reasons:   The patient has a prior MI or stroke diagnosis  Hepatic cirrhosis: - Patient is following with [facility] for weight loss and cirrhosis. Weight loss was suspected to be related to diet changes and reducing alcohol. He had a colonoscopy and upper GI endoscopy on 08/31/22 with following findings: diverticulosis in the sigmoid colon, internal hemorrhoids, and patchy moderate inflammation characterized by congestion (edema) and erythema was found in the gastric antrum. Biopsies were taken." Gastric biopsies revealed inflammation and vessel dilation. Liver biopsy was also done - results pending.      Outpatient Encounter Medications as of 09/28/2022  Medication Sig   acetaminophen (TYLENOL) 500 MG tablet Take 500 mg by mouth 2 (two) times daily as needed for moderate pain or headache.   aspirin EC 81 MG tablet Take 1 tablet (81 mg total) by mouth daily. Swallow whole.  carvedilol (COREG) 3.125 MG tablet Take 1 tablet (3.125 mg total) by mouth 2 (two) times daily with a meal.   citalopram (CELEXA) 40 MG tablet Take 1 tablet (40 mg total) by mouth daily.   ezetimibe (ZETIA) 10 MG tablet Take 1 tablet (10 mg total) by mouth daily.   lisinopril (ZESTRIL) 2.5 MG tablet TAKE 1 TABLET (2.5 MG TOTAL) BY MOUTH DAILY. PLEASE CONTACT OFFICE FOR ADDITIONAL REFILLS (Patient taking differently:  Take 2.5 mg by mouth daily.)   metFORMIN (GLUCOPHAGE) 500 MG tablet TAKE 1 TABLET BY MOUTH 2 TIMES DAILY WITH A MEAL.   nitroGLYCERIN (NITROSTAT) 0.4 MG SL tablet PLACE 1 TABLET (0.4 MG TOTAL) UNDER THE TONGUE EVERY 5 (FIVE) MINUTES AS NEEDED FOR CHEST PAIN (MAY REPEAT X 3 THAN CALL 911).   Omega-3 Fatty Acids (FISH OIL) 1000 MG CPDR Take 1,000 mg by mouth daily.   pantoprazole (PROTONIX) 40 MG tablet TAKE 1 TABLET BY MOUTH EVERY DAY (Patient taking differently: Take 40 mg by mouth every evening.)   [DISCONTINUED] empagliflozin (JARDIANCE) 10 MG TABS tablet Take 1 tablet (10 mg total) by mouth daily.   [DISCONTINUED] rosuvastatin (CRESTOR) 40 MG tablet Take 1 tablet (40 mg total) by mouth daily.   rosuvastatin (CRESTOR) 40 MG tablet Take 1 tablet (40 mg total) by mouth daily.   No facility-administered encounter medications on file as of 09/28/2022.    Past Medical History:  Diagnosis Date   Anterior myocardial infarction [facility]) 11/2008   CAD (coronary artery disease)    remote anterior MI with VT and DES to the LAD in May of 2010; NSTEMI July 2014 in [place] with cath showing stent patency, EF of 35 to 40% and possible vasospasm versus potential small vessel disease.    Cardiomyopathy, ischemic 05/07/2013   Chronic ischemic heart disease 04/06/2013   Formatting of this note might be different from the original. STORY: 10/2008 and hospitalized 01/2013 with positive troponins but widely patent arteries on cath. Formatting of this note might be different from the original. STORY: 10/2008 and hospitalized 01/2013 with positive troponins but widely patent arteries on cath. Formatting of this note might be different from the original. STORY: 10/2008 and    Chronic systolic heart failure ([place])    Depression    Former smoker 12/12/2017   GERD (gastroesophageal reflux disease) 4 years   H/O heart artery stent 05/20/2019   Hx of cardiovascular stress test 2015   Lexiscan Myoview (11/2013):  Anteroseptal,  apical anterior, apex, apical inferior scar; no ischemia, EF 50%; Intermediate Risk   Hyperlipidemia    Hypertension    Ischemic cardiomyopathy    a.Echo (11/2013):  EF 45-50%, apical HK   MI (myocardial infarction) ([place]) ? 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   Type II diabetes mellitus [facility])     Past Surgical History:  Procedure Laterality Date   CARDIAC CATHETERIZATION  X2   CARDIAC VALVE REPLACEMENT     COLONOSCOPY     Multiple-last 2021 history of polyps   CORONARY ANGIOGRAPHY N/A 07/19/2022   Procedure: CORONARY ANGIOGRAPHY;  Surgeon: Sherren Mocha, MD;  Location: [place] [facility];  Service: Cardiovascular;  Laterality: N/A;   CORONARY ANGIOPLASTY WITH STENT PLACEMENT  5/2010X 2   "1+1" (12/05/2017)   LAPAROSCOPIC CHOLECYSTECTOMY     LEFT HEART CATH AND CORONARY ANGIOGRAPHY N/A 12/06/2017   Procedure: LEFT HEART CATH AND  CORONARY ANGIOGRAPHY;  Surgeon: Lorretta Harp, MD;  Location: [place] [facility];  Service: Cardiovascular;  Laterality: N/A;   ORCHIECTOMY Right 1960s    Family History  Problem Relation Age of Onset   Diabetes Mother    Rheum arthritis Mother    CAD Father        MI at age 26   CAD Brother        MI at age 32   Post-traumatic stress disorder Brother    Suicidality Brother    Cancer Brother 59       Brain Tumor   Diabetes Brother    Atrial fibrillation Son    Stomach cancer Neg Hx    Colon cancer Neg Hx    Esophageal cancer Neg Hx     Social History   Socioeconomic History   Marital status: Married    Spouse name: Not on file   Number of children: 2   Years of education: Not on file   Highest education level: Not on file  Occupational History   Occupation: Occupational psychologist    Comment: Youth worker   Occupation: Press photographer  Tobacco Use   Smoking status: Former    Packs/day: 1.00    Years: 10.00    Total pack years: 10.00    Types:  Cigarettes    Quit date: 07/31/1981    Years since quitting: 41.1   Smokeless tobacco: Never  Vaping Use   Vaping Use: Never used  Substance and Sexual Activity   Alcohol use: Not Currently    Alcohol/week: 3.0 standard drinks of alcohol    Types: 3 Glasses of wine per week   Drug use: Not Currently    Types: Marijuana    Comment: 12/05/2017 "nothing since my early 20's"   Sexual activity: Yes  Other Topics Concern   Not on file  Social History Narrative   Married, 62 years as of [place]   Employed in [place] currently [place]   Prior alcohol approximately 7 to 10/week maximum stopped in October 2020   5 caffeinated beverages daily   Former smoker no current tobacco or drug use   Social Determinants of Radio broadcast assistant Strain: Not on file  Food Insecurity: Not on file  Transportation Needs: Not on file  Physical Activity: Not on file  Stress: Not on file  Social Connections: Not on file  Intimate Partner Violence: Not on file    ROS All review of systems negative except what is listed in the HPI      Objective    BP 116/68 (BP Location: Left Arm, Patient Position: Sitting, Cuff Size: Normal)   Pulse 65   Temp 98.9 F (37.2 C) (Temporal)   Resp 18   Ht '6\' 2"'$  (1.88 m)   Wt 217 lb 3.2 oz (98.5 kg)   SpO2 100%   BMI 27.89 kg/m   Physical Exam Vitals reviewed.  Constitutional:      Appearance: Normal appearance.  Cardiovascular:     Rate and Rhythm: Normal rate and regular rhythm.     Pulses: Normal pulses.     Heart sounds: Normal heart sounds.  Pulmonary:     Effort: Pulmonary effort is normal.     Breath sounds: Normal breath sounds.  Skin:    General: Skin is warm and dry.  Neurological:     Mental Status: He is alert and oriented to person, place, and time.  Psychiatric:  Mood and Affect: Mood normal.        Behavior: Behavior normal.        Thought Content: Thought content normal.        Judgment: Judgment  normal.             Assessment & Plan:   Problem List Items Addressed This Visit       Cardiovascular and Mediastinum   CAD (coronary artery disease)    Previously followed with cardiology, but states he does not plan to continue Currently taking coreg, lisinopril, aspirin, rosuvastatin Refused labs       Relevant Medications   rosuvastatin (CRESTOR) 40 MG tablet   Essential hypertension - Primary    Blood pressure is at goal for age and co-morbidities.   Recommendations: continue coreg and lisinopril - BP goal <130/80 - monitor and log blood pressures at home - check around the same time each day in a relaxed setting - Limit salt to <2000 mg/day - Follow DASH eating plan (heart healthy diet) - limit alcohol to 2 standard drinks per day for men and 1 per day for women - avoid tobacco products - get at least 2 hours of regular aerobic exercise weekly Patient aware of signs/symptoms requiring further/urgent evaluation. Patient refused labs.       Relevant Medications   rosuvastatin (CRESTOR) 40 MG tablet     Endocrine   Type 2 diabetes mellitus with other specified complication (HCC)    Stable. A1c 6% last fall. Refused repeat labs today.  Continue metformin.  Declined medication discussion regarding Jardiance vs Farxiga for diabetes and cardiac benefits Declined referral for eye exam      Relevant Medications   rosuvastatin (CRESTOR) 40 MG tablet     Other   Hyperlipidemia    Currently on rosuvastatin, Omega-3, Zetia Declined labs today Lifestyle factors for lowering cholesterol include: Diet therapy - heart-healthy diet rich in fruits, veggies, fiber-rich whole grains, lean meats, chicken, fish (at least twice a week), fat-free or 1% dairy products; foods low in saturated/trans fats, cholesterol, sodium, and sugar. Mediterranean diet has shown to be very heart healthy. Regular exercise - recommend at least 30 minutes a day, 5 times per week Weight  management        Relevant Medications   rosuvastatin (CRESTOR) 40 MG tablet   MDD (major depressive disorder), recurrent episode, moderate (HCC)    Currently on Celexa Denies active SI/HI, but passive thoughts present at times Refused referral for [facility] med management and counseling Patient aware of signs/symptoms requiring further/urgent evaluation.       Other Visit Diagnoses     Other cirrhosis of liver ([place])     Following with GI       Recommend routine 4-monthfollow-up for chronic disease management and refills/labs. Patient states "not necessary, I'll just let you know when I need refills."      TTerrilyn Saver NP

## 2022-09-28 NOTE — Assessment & Plan Note (Signed)
Stable. A1c 6% last fall. Refused repeat labs today.  Continue metformin.  Declined medication discussion regarding Jardiance vs Farxiga for diabetes and cardiac benefits Declined referral for eye exam

## 2022-09-28 NOTE — Patient Instructions (Signed)
Thank you for choosing [facility] at [facility] for your Primary Care needs. I am excited for the opportunity to partner with you to meet your health care goals. It was a pleasure meeting you today!  Information on diet, exercise, and health maintenance recommendations are listed below. This is information to help you be sure you are on track for optimal health and monitoring.   Please look over this and let us know if you have any questions or if you have completed any of the health maintenance outside of [place] so that we can be sure your records are up to date.  ___________________________________________________________  MyChart:  For all urgent or time sensitive needs we ask that you please call the office to avoid delays. Our number is [phone]. MyChart is not constantly monitored and due to the large volume of messages a day, replies may take up to 72 business hours.  MyChart Policy: MyChart allows for you to see your visit notes, after visit summary, provider recommendations, lab and tests results, make an appointment, request refills, and contact your provider or the office for non-urgent questions or concerns. Providers are seeing patients during normal business hours and do not have built in time to review MyChart messages.  We ask that you allow a minimum of 3 business days for responses to [employer]. For this reason, please do not send urgent requests through [place]. Please call the office at [phone]. New and ongoing conditions may require a visit. We have virtual and in-person visits available for your convenience.  Complex MyChart concerns may require a visit. Your provider may request you schedule a virtual or in-person visit to ensure we are providing the best care possible. MyChart messages sent after 11:00 AM on Friday will not be received by the provider until Monday morning.    Lab and Test Results: You will receive your lab and test  results on MyChart as soon as they are completed and results have been sent by the lab or testing facility. Due to this service, you will receive your results BEFORE your provider.  I review lab and test results each morning prior to seeing patients. Some results require collaboration with other providers to ensure you are receiving the most appropriate care. For this reason, we ask that you please allow a minimum of 3-5 business days from the time that ALL results have been received for your provider to receive and review lab and test results and contact you about these.  Most lab and test result comments from the provider will be sent through [place]. Your provider may recommend changes to the plan of care, follow-up visits, repeat testing, ask questions, or request an office visit to discuss these results. You may reply directly to this message or call the office to provide information for the provider or set up an appointment. In some instances, you will be called with test results and recommendations. Please let us know if this is preferred and we will make note of this in your chart to provide this for you.    If you have not heard a response to your lab or test results in 5 business days from all results returning to [place], please call the office to let us know. We ask that you please avoid calling prior to this time unless there is an emergent concern. Due to high call volumes, this can delay the resulting process.  After Hours: For all non-emergency after hours needs, please  call the office at [phone] and select the option to reach the on-call  service. On-call services are shared between multiple [facility] offices and therefore it will not be possible to speak directly with your provider. On-call providers may provide medical advice and recommendations, but are unable to provide refills for maintenance medications.  For all emergency or urgent medical needs after normal business hours, we  recommend that you seek care at the closest Urgent Care or Emergency Department to ensure appropriate treatment in a timely manner.  [facility] has a 24 hour emergency room located on the ground floor for your convenience.   Urgent Concerns During the Business Day Providers are seeing patients from 8AM to [place] with a busy schedule and are most often not able to respond to non-urgent calls until the end of the day or the next business day. If you should have URGENT concerns during the day, please call and speak to the nurse or schedule a same day appointment so that we can address your concern without delay.   Thank you, again, for choosing me as your health care partner. I appreciate your trust and look forward to learning more about you!   [place]cell [place] Olevia Bowens, DNP, FNP-C  ___________________________________________________________  Health Maintenance Recommendations Screening Testing Mammogram Every 1-2 years based on history and risk factors Starting at age 44 Pap Smear Ages 21-39 every 3 years Ages 7-65 every 5 years with HPV testing More frequent testing may be required based on results and history Colon Cancer Screening Every 1-10 years based on test performed, risk factors, and history Starting at age 64 Bone Density Screening Every 2-10 years based on history Starting at age 6 for women Recommendations for men differ based on medication usage, history, and risk factors AAA Screening One time ultrasound Men 34-11 years old who have ever smoked Lung Cancer Screening Low Dose Lung CT every 12 months Age 66-80 years with a 20 pack-year smoking history who still smoke or who have quit within the last 15 years  Screening Labs Routine  Labs: Complete Blood Count (CBC), Complete Metabolic Panel (CMP), Cholesterol (Lipid Panel) Every 6-12 months based on history and medications May be recommended more frequently based on current conditions or previous results Hemoglobin  A1c Lab Every 3-12 months based on history and previous results Starting at age 46 or earlier with diagnosis of diabetes, high cholesterol, BMI >26, and/or risk factors Frequent monitoring for patients with diabetes to ensure blood sugar control Thyroid Panel  Every 6 months based on history, symptoms, and risk factors May be repeated more often if on medication HIV One time testing for all patients 90 and older May be repeated more frequently for patients with increased risk factors or exposure Hepatitis C One time testing for all patients 44 and older May be repeated more frequently for patients with increased risk factors or exposure Gonorrhea, Chlamydia Every 12 months for all sexually active persons 13-24 years Additional monitoring may be recommended for those who are considered high risk or who have symptoms PSA Men 43-34 years old with risk factors Additional screening may be recommended from age 36-69 based on risk factors, symptoms, and history  Vaccine Recommendations Tetanus Booster All adults every 10 years Flu Vaccine All patients 6 months and older every year COVID Vaccine All patients 12 years and older Initial dosing with booster May recommend additional booster based on age and health history HPV Vaccine 2 doses all patients age 45-26 Dosing may be considered  for patients over 26 Shingles Vaccine (Shingrix) 2 doses all adults 29 years and older Pneumonia (Pneumovax 23) All adults 38 years and older May recommend earlier dosing based on health history Pneumonia (Prevnar 31) All adults 33 years and older Dosed 1 year after Pneumovax 23 Pneumonia (Prevnar 74) All adults 74 years and older (adults [id number] with certain conditions or risk factors) 1 dose  For those who have not received Prevnar 13 vaccine previously   Additional Screening, Testing, and Vaccinations may be recommended on an individualized basis based on family history, health history, risk  factors, and/or exposure.  __________________________________________________________  Diet Recommendations for All Patients  I recommend that all patients maintain a diet low in saturated fats, carbohydrates, and cholesterol. While this can be challenging at first, it is not impossible and small changes can make big differences.  Things to try: Decreasing the amount of soda, sweet tea, and/or juice to one or less per day and replace with water While water is always the first choice, if you do not like water you may consider adding a water additive without sugar to improve the taste other sugar free drinks Replace potatoes with a brightly colored vegetable  Use healthy oils, such as canola oil or olive oil, instead of butter or hard margarine Limit your bread intake to two pieces or less a day Replace regular pasta with low carb pasta options Bake, broil, or grill foods instead of frying Monitor portion sizes  Eat smaller, more frequent meals throughout the day instead of large meals  An important thing to remember is, if you love foods that are not great for your health, you don't have to give them up completely. Instead, allow these foods to be a reward when you have done well. Allowing yourself to still have special treats every once in a while is a nice way to tell yourself thank you for working hard to keep yourself healthy.   Also remember that every day is a new day. If you have a bad day and "fall off the wagon", you can still climb right back up and keep moving along on your journey!  We have resources available to help you!  Some websites that may be helpful include: www.http://carter.biz/  Www.VeryWellFit.com _____________________________________________________________  Activity Recommendations for All Patients  I recommend that all adults get at least 20 minutes of moderate physical activity that elevates your heart rate at least 5 days out of the week.  Some examples  include: Walking or jogging at a pace that allows you to carry on a conversation Cycling (stationary bike or outdoors) Water aerobics Yoga Weight lifting Dancing If physical limitations prevent you from putting stress on your joints, exercise in a pool or seated in a chair are excellent options.  Do determine your MAXIMUM heart rate for activity: 220 - YOUR AGE = MAX Heart Rate   Remember! Do not push yourself too hard.  Start slowly and build up your pace, speed, weight, time in exercise, etc.  Allow your body to rest between exercise and get good sleep. You will need more water than normal when you are exerting yourself. Do not wait until you are thirsty to drink. Drink with a purpose of getting in at least 8, 8 ounce glasses of water a day plus more depending on how much you exercise and sweat.    If you begin to develop dizziness, chest pain, abdominal pain, jaw pain, shortness of breath, headache, vision changes, lightheadedness, or other concerning symptoms,  stop the activity and allow your body to rest. If your symptoms are severe, seek emergency evaluation immediately. If your symptoms are concerning, but not severe, please let us know so that we can recommend further evaluation.

## 2022-09-28 NOTE — Telephone Encounter (Signed)
Message left re: trying to review liver biopsy results

## 2022-09-28 NOTE — Assessment & Plan Note (Signed)
Blood pressure is at goal for age and co-morbidities.   Recommendations: continue coreg and lisinopril - BP goal <130/80 - monitor and log blood pressures at home - check around the same time each day in a relaxed setting - Limit salt to <2000 mg/day - Follow DASH eating plan (heart healthy diet) - limit alcohol to 2 standard drinks per day for men and 1 per day for women - avoid tobacco products - get at least 2 hours of regular aerobic exercise weekly Patient aware of signs/symptoms requiring further/urgent evaluation. Patient refused labs.

## 2022-09-29 NOTE — Telephone Encounter (Signed)
Reviewed liver biopsy results with the patient.  Explained he has cirrhosis that appears compensated and the cause is not entirely clear question could there be an autoimmune component versus MASH or both.  He needs:  Referral to [facility] - re: cirrhosis ? Autoimmune hepatitis   I have placed orders for IgG, IgA and IgM  levels and LFTs and he will come next week to do those.

## 2022-09-29 NOTE — Telephone Encounter (Signed)
[facility] sent along with pt records.

## 2022-10-02 ENCOUNTER — Encounter: Payer: Self-pay | Admitting: Internal Medicine

## 2022-10-02 DIAGNOSIS — K7460 Unspecified cirrhosis of liver: Secondary | ICD-10-CM

## 2022-10-02 HISTORY — DX: Unspecified cirrhosis of liver: K74.60

## 2022-10-03 ENCOUNTER — Other Ambulatory Visit (INDEPENDENT_AMBULATORY_CARE_PROVIDER_SITE_OTHER): Payer: Medicare Other

## 2022-10-03 DIAGNOSIS — K7460 Unspecified cirrhosis of liver: Secondary | ICD-10-CM | POA: Diagnosis not present

## 2022-10-03 LAB — HEPATIC FUNCTION PANEL
ALT: 31 U/L (ref 0–53)
AST: 25 U/L (ref 0–37)
Albumin: 3.9 g/dL (ref 3.5–5.2)
Alkaline Phosphatase: 106 U/L (ref 39–117)
Bilirubin, Direct: 0.3 mg/dL (ref 0.0–0.3)
Total Bilirubin: 1 mg/dL (ref 0.2–1.2)
Total Protein: 7 g/dL (ref 6.0–8.3)

## 2022-10-04 ENCOUNTER — Encounter: Payer: Self-pay | Admitting: Internal Medicine

## 2022-10-04 LAB — IGG, IGA, IGM
IgG (Immunoglobin G), Serum: 774 mg/dL (ref 600–1540)
IgM, Serum: 82 mg/dL (ref 50–300)
Immunoglobulin A: 504 mg/dL — ABNORMAL HIGH (ref 70–320)

## 2022-10-05 MED ORDER — PANTOPRAZOLE SODIUM 40 MG PO TBEC: 40.0000 mg | DELAYED_RELEASE_TABLET | Freq: Every day | ORAL | 0 refills | Status: DC

## 2022-10-05 MED ORDER — EZETIMIBE 10 MG PO TABS: 10.0000 mg | ORAL_TABLET | Freq: Every day | ORAL | 3 refills | Status: DC

## 2022-10-05 MED ORDER — LISINOPRIL 2.5 MG PO TABS: 2.5000 mg | ORAL_TABLET | Freq: Every day | ORAL | 2 refills | Status: DC

## 2022-10-05 MED ORDER — CITALOPRAM HYDROBROMIDE 40 MG PO TABS: 40.0000 mg | ORAL_TABLET | Freq: Every day | ORAL | 0 refills | Status: DC

## 2022-10-05 MED ORDER — BUSPIRONE HCL 10 MG PO TABS: 10.0000 mg | ORAL_TABLET | Freq: Three times a day (TID) | ORAL | 0 refills | Status: DC | PRN

## 2022-10-05 NOTE — Addendum Note (Signed)
Addended by: Caleen Jobs B on: 10/05/2022 04:50 PM   Modules accepted: Orders

## 2022-10-06 ENCOUNTER — Ambulatory Visit: Payer: Medicare Other | Admitting: Family Medicine

## 2022-10-24 ENCOUNTER — Other Ambulatory Visit: Payer: Self-pay | Admitting: Pharmacist Clinician (PhC)/ Clinical Pharmacy Specialist

## 2022-10-24 DIAGNOSIS — E785 Hyperlipidemia, unspecified: Secondary | ICD-10-CM

## 2022-11-02 DIAGNOSIS — E785 Hyperlipidemia, unspecified: Secondary | ICD-10-CM | POA: Diagnosis not present

## 2022-11-02 NOTE — Telephone Encounter (Signed)
error 

## 2022-11-03 ENCOUNTER — Encounter: Payer: Self-pay | Admitting: Pharmacist Clinician (PhC)/ Clinical Pharmacy Specialist

## 2022-11-03 ENCOUNTER — Other Ambulatory Visit: Payer: Self-pay | Admitting: Family Medicine

## 2022-11-03 DIAGNOSIS — F331 Major depressive disorder, recurrent, moderate: Secondary | ICD-10-CM

## 2022-11-03 LAB — HEPATIC FUNCTION PANEL
ALT: 35 IU/L (ref 0–44)
AST: 34 IU/L (ref 0–40)
Albumin: 4.2 g/dL (ref 3.9–4.9)
Alkaline Phosphatase: 99 IU/L (ref 44–121)
Bilirubin Total: 0.6 mg/dL (ref 0.0–1.2)
Bilirubin, Direct: 0.19 mg/dL (ref 0.00–0.40)
Total Protein: 7 g/dL (ref 6.0–8.5)

## 2022-11-03 LAB — LIPID PANEL
Chol/HDL Ratio: 3.4 ratio (ref 0.0–5.0)
Cholesterol, Total: 195 mg/dL (ref 100–199)
HDL: 57 mg/dL (ref >39)
LDL Chol Calc (NIH): 119 mg/dL — ABNORMAL HIGH (ref 0–99)
Triglycerides: 106 mg/dL (ref 0–149)
VLDL Cholesterol Cal: 19 mg/dL (ref 5–40)

## 2022-11-15 ENCOUNTER — Ambulatory Visit: Payer: Medicare Other | Admitting: Family Medicine

## 2022-11-27 ENCOUNTER — Other Ambulatory Visit: Payer: Self-pay | Admitting: Family Medicine

## 2022-11-27 DIAGNOSIS — F331 Major depressive disorder, recurrent, moderate: Secondary | ICD-10-CM

## 2022-12-01 NOTE — Progress Notes (Signed)
HPI: FU coronary artery disease. Previously followed in [facility]. 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 He had a drug-eluting stent to his LAD in May of 2010. He apparently had nausea and vomiting and did not keep his Plavix down. He had stent thrombosis requiring a second intervention. Cardiac catheterization December 2023 showed patent LAD stent and minimal to mild obstructive plaquing in other vessels.  Echocardiogram December 2023 showed ejection fraction 35 to 40%, grade 1 diastolic dysfunction, mild RV dysfunction.  CTA December 2023 showed no aneurysm, dissection or pulmonary embolus.  Since he was last seen, he denies dyspnea, chest pain, palpitations or syncope.  Current Outpatient Medications  Medication Sig Dispense Refill   acetaminophen (TYLENOL) 500 MG tablet Take 500 mg by mouth 2 (two) times daily as needed for moderate pain or headache.     aspirin EC 81 MG tablet Take 1 tablet (81 mg total) by mouth daily. Swallow whole. 90 tablet 1   busPIRone (BUSPAR) 10 MG tablet Take 1 tablet (10 mg total) by mouth 3 (three) times daily as needed. 270 tablet 0   carvedilol (COREG) 3.125 MG tablet Take 1 tablet (3.125 mg total) by mouth 2 (two) times daily with a meal. 180 tablet 0   citalopram (CELEXA) 40 MG tablet TAKE 1 TABLET BY MOUTH EVERY DAY 90 tablet 2   ezetimibe (ZETIA) 10 MG tablet Take 1 tablet (10 mg total) by mouth daily. 90 tablet 3   lisinopril (ZESTRIL) 2.5 MG tablet Take 1 tablet (2.5 mg total) by mouth daily. PLEASE CONTACT OFFICE FOR ADDITIONAL REFILLS 90 tablet 2   metFORMIN (GLUCOPHAGE) 500 MG tablet TAKE 1 TABLET BY MOUTH 2 TIMES DAILY WITH A MEAL. 180 tablet 1   nitroGLYCERIN (NITROSTAT) 0.4 MG SL tablet PLACE 1 TABLET (0.4 MG TOTAL) UNDER THE TONGUE EVERY 5 (FIVE) MINUTES AS NEEDED FOR CHEST PAIN (MAY REPEAT X 3 THAN CALL 911). 25 tablet 3   Omega-3 Fatty Acids (FISH OIL) 1000 MG CPDR Take 1,000 mg by mouth  daily.     pantoprazole (PROTONIX) 40 MG tablet Take 1 tablet (40 mg total) by mouth daily. 90 tablet 0   rosuvastatin (CRESTOR) 40 MG tablet Take 1 tablet (40 mg total) by mouth daily. 90 tablet 3   No current facility-administered medications for this visit.     Past Medical History:  Diagnosis Date   Anterior myocardial infarction [facility]) 11/2008   CAD (coronary artery disease)    remote anterior MI with VT and DES to the LAD in May of 2010; NSTEMI July 2014 in [place] with cath showing stent patency, EF of 35 to 40% and possible vasospasm versus potential small vessel disease.    Cardiomyopathy, ischemic 05/07/2013   Chronic ischemic heart disease 04/06/2013   Formatting of this note might be different from the original. STORY: 10/2008 and hospitalized 01/2013 with positive troponins but widely patent arteries on cath. Formatting of this note might be different from the original. STORY: 10/2008 and hospitalized 01/2013 with positive troponins but widely patent arteries on cath. Formatting of this note might be different from the original. STORY: 10/2008 and    Chronic systolic heart failure (HCC)    Depression    Former smoker 12/12/2017   GERD (gastroesophageal reflux disease) 4 years   H/O heart artery stent 05/20/2019   Hepatic cirrhosis (HCC) 10/02/2022   MASH vs autoimmune or both Referral to [facility]  Hx of cardiovascular stress test 2015   Lexiscan Myoview (11/2013):  Anteroseptal, apical anterior, apex, apical inferior scar; no ischemia, EF 50%; Intermediate Risk   Hyperlipidemia    Hypertension    Ischemic cardiomyopathy    a.Echo (11/2013):  EF 45-50%, apical HK   MI (myocardial infarction) (HCC) ? 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   Type II diabetes mellitus [facility])     Past Surgical History:  Procedure Laterality Date   CARDIAC CATHETERIZATION  X2   CARDIAC VALVE  REPLACEMENT     COLONOSCOPY     Multiple-last 2021 history of polyps   CORONARY ANGIOGRAPHY N/A 07/19/2022   Procedure: CORONARY ANGIOGRAPHY;  Surgeon: Tonny Bollman, MD;  Location: [facility] INVASIVE CV LAB;  Service: Cardiovascular;  Laterality: N/A;   CORONARY ANGIOPLASTY WITH STENT PLACEMENT  5/2010X 2   "1+1" (12/05/2017)   LAPAROSCOPIC CHOLECYSTECTOMY     LEFT HEART CATH AND CORONARY ANGIOGRAPHY N/A 12/06/2017   Procedure: LEFT HEART CATH AND CORONARY ANGIOGRAPHY;  Surgeon: Runell Gess, MD;  Location: [facility] INVASIVE CV LAB;  Service: Cardiovascular;  Laterality: N/A;   ORCHIECTOMY Right 1960s    Social History   Socioeconomic History   Marital status: Married    Spouse name: Not on file   Number of children: 2   Years of education: Not on file   Highest education level: Not on file  Occupational History   Occupation: Teacher, adult education    Comment: Teacher, adult education   Occupation: Airline pilot  Tobacco Use   Smoking status: Former    Packs/day: 1.00    Years: 10.00    Additional pack years: 0.00    Total pack years: 10.00    Types: Cigarettes    Quit date: 07/31/1981    Years since quitting: 41.3   Smokeless tobacco: Never  Vaping Use   Vaping Use: Never used  Substance and Sexual Activity   Alcohol use: Not Currently    Alcohol/week: 3.0 standard drinks of alcohol    Types: 3 Glasses of wine per week   Drug use: Not Currently    Types: Marijuana    Comment: 12/05/2017 "nothing since my early 20's"   Sexual activity: Yes  Other Topics Concern   Not on file  Social History Narrative   Married, 43 years as of 2023   Angelique Blonder   Employed in water filter sales currently [place]   Prior alcohol approximately 7 to 10/week maximum stopped in October 2020   5 caffeinated beverages daily   Former smoker no current tobacco or drug use   Social Determinants of Community education officer: Not on file  Food Insecurity: Not on file  Transportation Needs: Not on file  Physical  Activity: Not on file  Stress: Not on file  Social Connections: Not on file  Intimate Partner Violence: Not on file    Family History  Problem Relation Age of Onset   Diabetes Mother    Rheum arthritis Mother    CAD Father        MI at age 1   CAD Brother        MI at age 3   Post-traumatic stress disorder Brother    Suicidality Brother    Cancer Brother 67       Brain Tumor   Diabetes Brother    Atrial fibrillation Son  Stomach cancer Neg Hx    Colon cancer Neg Hx    Esophageal cancer Neg Hx     ROS: no fevers or chills, productive cough, hemoptysis, dysphasia, odynophagia, melena, hematochezia, dysuria, hematuria, rash, seizure activity, orthopnea, PND, pedal edema, claudication. Remaining systems are negative.  Physical Exam: Well-developed well-nourished in no acute distress.  Skin is warm and dry.  HEENT is normal.  Neck is supple.  Chest is clear to auscultation with normal expansion.  Cardiovascular exam is regular rate and rhythm.  Abdominal exam nontender or distended. No masses palpated. Extremities show no edema. neuro grossly intact  ECG- personally reviewed  A/P  1 coronary artery disease-patient denies recurrent chest pain.  Most recent catheterization revealed patent stent in the LAD and no other obstructive disease.  Continue medical therapy with aspirin and statin.  2 ischemic cardiomyopathy-continue low-dose Coreg.  I would like to discontinue his lisinopril and begin Entresto 24/26 twice daily 48 hours later.  He will check to see about the expense of this and we will proceed if affordable.  Will plan to repeat echocardiogram in 3 to 6 months to reassess LV function on medical therapy.  Will consider addition of spironolactone in the future if blood pressure allows.  Continue Jardiance.  3 hypertension-patient's blood pressure is controlled.  Plan as outlined above.  4 hyperlipidemia-continue Crestor and Zetia.  5 cirrhosis-follow-up  hepatology.  Olga Millers, MD

## 2022-12-06 ENCOUNTER — Other Ambulatory Visit: Payer: Self-pay | Admitting: Nurse Practitioner

## 2022-12-06 DIAGNOSIS — K7469 Other cirrhosis of liver: Secondary | ICD-10-CM

## 2022-12-06 DIAGNOSIS — K760 Fatty (change of) liver, not elsewhere classified: Secondary | ICD-10-CM

## 2022-12-06 DIAGNOSIS — F1090 Alcohol use, unspecified, uncomplicated: Secondary | ICD-10-CM | POA: Diagnosis not present

## 2022-12-06 DIAGNOSIS — R634 Abnormal weight loss: Secondary | ICD-10-CM | POA: Diagnosis not present

## 2022-12-06 DIAGNOSIS — K3189 Other diseases of stomach and duodenum: Secondary | ICD-10-CM

## 2022-12-06 DIAGNOSIS — K766 Portal hypertension: Secondary | ICD-10-CM | POA: Diagnosis not present

## 2022-12-06 DIAGNOSIS — Z148 Genetic carrier of other disease: Secondary | ICD-10-CM | POA: Diagnosis not present

## 2022-12-11 ENCOUNTER — Ambulatory Visit: Payer: Medicare Other | Attending: Cardiology | Admitting: Cardiology

## 2022-12-11 ENCOUNTER — Encounter: Payer: Self-pay | Admitting: Cardiology

## 2022-12-11 VITALS — BP 122/64 | HR 62 | Ht 74.5 in | Wt 218.0 lb

## 2022-12-11 DIAGNOSIS — E785 Hyperlipidemia, unspecified: Secondary | ICD-10-CM

## 2022-12-11 DIAGNOSIS — I2510 Atherosclerotic heart disease of native coronary artery without angina pectoris: Secondary | ICD-10-CM | POA: Diagnosis not present

## 2022-12-11 DIAGNOSIS — I10 Essential (primary) hypertension: Secondary | ICD-10-CM | POA: Insufficient documentation

## 2022-12-11 DIAGNOSIS — I255 Ischemic cardiomyopathy: Secondary | ICD-10-CM | POA: Insufficient documentation

## 2022-12-11 MED ORDER — ENTRESTO 24-26 MG PO TABS: 1.0000 | ORAL_TABLET | Freq: Two times a day (BID) | ORAL | 3 refills | Status: DC

## 2022-12-11 NOTE — Patient Instructions (Signed)
Medication Instructions:  Stop taking Lisinopril for 48 hrs, THEN Start taking Entresto 24-26- one tablet two times a day *If you need a refill on your cardiac medications before your next appointment, please call your pharmacy*  Follow-Up: At [facility], you and your health needs are our priority.  As part of our continuing mission to provide you with exceptional heart care, we have created designated Provider Care Teams.  These Care Teams include your primary Cardiologist (physician) and Advanced Practice Providers (APPs -  Physician Assistants and Nurse Practitioners) who all work together to provide you with the care you need, when you need it.  We recommend signing up for the patient portal called "MyChart".  Sign up information is provided on this After Visit Summary.  MyChart is used to connect with patients for Virtual Visits (Telemedicine).  Patients are able to view lab/test results, encounter notes, upcoming appointments, etc.  Non-urgent messages can be sent to your provider as well.   To learn more about what you can do with MyChart, go to [email or web address].    Your next appointment:   6 month(s)  Provider:   Olga Millers, MD

## 2022-12-13 ENCOUNTER — Encounter: Payer: Self-pay | Admitting: Cardiology

## 2022-12-14 MED ORDER — LISINOPRIL 2.5 MG PO TABS: 2.5000 mg | ORAL_TABLET | Freq: Every day | ORAL | 3 refills | Status: DC

## 2022-12-29 ENCOUNTER — Other Ambulatory Visit: Payer: Self-pay | Admitting: Family Medicine

## 2022-12-29 DIAGNOSIS — F331 Major depressive disorder, recurrent, moderate: Secondary | ICD-10-CM

## 2022-12-29 DIAGNOSIS — K219 Gastro-esophageal reflux disease without esophagitis: Secondary | ICD-10-CM

## 2023-01-05 ENCOUNTER — Other Ambulatory Visit: Payer: Medicare Other

## 2023-01-10 ENCOUNTER — Other Ambulatory Visit: Payer: Self-pay | Admitting: Cardiology

## 2023-01-10 DIAGNOSIS — I2510 Atherosclerotic heart disease of native coronary artery without angina pectoris: Secondary | ICD-10-CM

## 2023-01-10 DIAGNOSIS — I10 Essential (primary) hypertension: Secondary | ICD-10-CM

## 2023-01-17 ENCOUNTER — Ambulatory Visit
Admission: RE | Admit: 2023-01-17 | Discharge: 2023-01-17 | Disposition: A | Discharge status: Home / Self Care | Payer: Medicare Other | Source: Ambulatory Visit | Attending: Nurse Practitioner | Admitting: Nurse Practitioner

## 2023-01-17 DIAGNOSIS — K760 Fatty (change of) liver, not elsewhere classified: Secondary | ICD-10-CM

## 2023-01-17 DIAGNOSIS — K7469 Other cirrhosis of liver: Secondary | ICD-10-CM

## 2023-01-17 DIAGNOSIS — K3189 Other diseases of stomach and duodenum: Secondary | ICD-10-CM

## 2023-01-17 DIAGNOSIS — K7460 Unspecified cirrhosis of liver: Secondary | ICD-10-CM | POA: Diagnosis not present

## 2023-01-31 ENCOUNTER — Other Ambulatory Visit: Payer: Self-pay | Admitting: Nurse Practitioner

## 2023-02-14 ENCOUNTER — Other Ambulatory Visit: Payer: Self-pay | Admitting: Family Medicine

## 2023-02-14 DIAGNOSIS — K219 Gastro-esophageal reflux disease without esophagitis: Secondary | ICD-10-CM

## 2023-02-15 MED ORDER — PANTOPRAZOLE SODIUM 40 MG PO TBEC
40.0000 mg | DELAYED_RELEASE_TABLET | Freq: Every day | ORAL | 0 refills | Status: DC
Start: 2023-02-15 — End: 2023-05-17

## 2023-02-21 ENCOUNTER — Ambulatory Visit: Payer: Medicare Other | Admitting: Family Medicine

## 2023-02-28 ENCOUNTER — Ambulatory Visit (INDEPENDENT_AMBULATORY_CARE_PROVIDER_SITE_OTHER): Payer: Medicare Other | Admitting: *Deleted

## 2023-02-28 ENCOUNTER — Ambulatory Visit (INDEPENDENT_AMBULATORY_CARE_PROVIDER_SITE_OTHER): Payer: Medicare Other | Admitting: Family Medicine

## 2023-02-28 ENCOUNTER — Encounter: Payer: Self-pay | Admitting: Family Medicine

## 2023-02-28 VITALS — BP 132/89 | HR 61 | Ht 74.5 in | Wt 222.6 lb

## 2023-02-28 DIAGNOSIS — I2583 Coronary atherosclerosis due to lipid rich plaque: Secondary | ICD-10-CM | POA: Diagnosis not present

## 2023-02-28 DIAGNOSIS — I10 Essential (primary) hypertension: Secondary | ICD-10-CM

## 2023-02-28 DIAGNOSIS — E1169 Type 2 diabetes mellitus with other specified complication: Secondary | ICD-10-CM | POA: Diagnosis not present

## 2023-02-28 DIAGNOSIS — K7469 Other cirrhosis of liver: Secondary | ICD-10-CM | POA: Diagnosis not present

## 2023-02-28 DIAGNOSIS — E119 Type 2 diabetes mellitus without complications: Secondary | ICD-10-CM

## 2023-02-28 DIAGNOSIS — Z7984 Long term (current) use of oral hypoglycemic drugs: Secondary | ICD-10-CM | POA: Diagnosis not present

## 2023-02-28 DIAGNOSIS — Z0000 Encounter for general adult medical examination without abnormal findings: Secondary | ICD-10-CM

## 2023-02-28 DIAGNOSIS — I2510 Atherosclerotic heart disease of native coronary artery without angina pectoris: Secondary | ICD-10-CM | POA: Diagnosis not present

## 2023-02-28 DIAGNOSIS — F331 Major depressive disorder, recurrent, moderate: Secondary | ICD-10-CM

## 2023-02-28 DIAGNOSIS — E785 Hyperlipidemia, unspecified: Secondary | ICD-10-CM | POA: Diagnosis not present

## 2023-02-28 LAB — COMPREHENSIVE METABOLIC PANEL
ALT: 30 U/L (ref 0–53)
AST: 33 U/L (ref 0–37)
Albumin: 4.1 g/dL (ref 3.5–5.2)
Alkaline Phosphatase: 87 U/L (ref 39–117)
BUN: 13 mg/dL (ref 6–23)
CO2: 28 mEq/L (ref 19–32)
Calcium: 9.4 mg/dL (ref 8.4–10.5)
Chloride: 105 mEq/L (ref 96–112)
Creatinine, Ser: 0.8 mg/dL (ref 0.40–1.50)
GFR: 92.38 mL/min (ref >60.00)
Glucose, Bld: 99 mg/dL (ref 70–99)
Potassium: 4.2 mEq/L (ref 3.5–5.1)
Sodium: 139 mEq/L (ref 135–145)
Total Bilirubin: 0.6 mg/dL (ref 0.2–1.2)
Total Protein: 6.8 g/dL (ref 6.0–8.3)

## 2023-02-28 LAB — MICROALBUMIN / CREATININE URINE RATIO
Creatinine,U: 95.8 mg/dL
Microalb Creat Ratio: 0.7 mg/g (ref 0.0–30.0)
Microalb, Ur: <0.7 mg/dL (ref 0.0–1.9)

## 2023-02-28 LAB — HEMOGLOBIN A1C: Hgb A1c MFr Bld: 6.1 % (ref 4.6–6.5)

## 2023-02-28 NOTE — Assessment & Plan Note (Signed)
Currently on rosuvastatin, Omega-3 Following with cardiology  Lifestyle factors for lowering cholesterol include: Diet therapy - heart-healthy diet rich in fruits, veggies, fiber-rich whole grains, lean meats, chicken, fish (at least twice a week), fat-free or 1% dairy products; foods low in saturated/trans fats, cholesterol, sodium, and sugar. Mediterranean diet has shown to be very heart healthy. Regular exercise - recommend at least 30 minutes a day, 5 times per week Weight management

## 2023-02-28 NOTE — Progress Notes (Signed)
Established Patient Office Visit  Subjective    Patient ID: Martin Frank, male    DOB: 23-Jan-1956  Age: 67 y.o. MRN: [id number]  CC:  Chief Complaint  Patient presents with   Follow-up    No concerns     HPI Martin Frank presents for 67-month follow-up, chronic disease management. Reports he has been doing well and feeling good lately! He has been able to spend a lot of time with his grandchildren this summer and is planning to travel to the mountains next month.   Hypertension: - Medications: carvedilol 3.125 mg BID, lisinopril 2.5 mg daily - Compliance: good - Checking BP at home: no - Denies any SOB, recurrent headaches, CP, vision changes, LE edema, dizziness, palpitations, or medication side effects.  Diabetes: - Checking BG at home: no - Medications: metformin 500 mg BID  - Compliance: good - Eye exam: UTD per patient - he will get [place] a copy  - Foot exam: today - Microalbumin: today - Denies symptoms of hypoglycemia, polyuria, polydipsia, numbness extremities, foot ulcers/trauma, wounds that are not healing, medication side effects  Lab Results  Component Value Date   HGBA1C 6.1 02/28/2023   Mood disorder: - Diagnosis:depression - Treatment: Celexa 40 mg daily - Medication side effects: none - SI/HI: none - Update: Reports he is doing well overall. No desire to make any changes at this time.   Hyperlipidemia: - medications: rosuvastatin 40 mg daily, Omega 3  - compliance: good - medication SEs: no The ASCVD Risk score (Arnett DK, et al., 2019) failed to calculate for the following reasons:   The patient has a prior MI or stroke diagnosis -He is following with cardiology and the lipid clinic  Hepatic cirrhosis: - Patient is following with [facility] for weight loss and cirrhosis. Weight loss was suspected to be related to diet changes and reducing alcohol -reports his weight has recently stabilized. He had a colonoscopy and upper GI endoscopy on 08/31/22  with following findings: diverticulosis in the sigmoid colon, internal hemorrhoids, and patchy moderate inflammation characterized by congestion (edema) and erythema was found in the gastric antrum. Biopsies were taken." Gastric biopsies revealed inflammation and vessel dilation." Liver biopsy was done 09/14/22 with biopsies - cirrhosis (compensated) uncertain etiology (autoimmune vs. MASH or both). He was referred to [facility] in [place]. He established with Annamarie Major, NP on 12/06/22. He was advised to reduce risk factors. She suggested considering GLP-1. She advised regular liver US screenings. He is scheduled to follow-up with her again in November.       Past Medical History:  Diagnosis Date   Anterior myocardial infarction [facility]) 11/2008   CAD (coronary artery disease)    remote anterior MI with VT and DES to the LAD in May of 2010; NSTEMI July 2014 in [place] with cath showing stent patency, EF of 35 to 40% and possible vasospasm versus potential small vessel disease.    Cardiomyopathy, ischemic 05/07/2013   Chronic ischemic heart disease 04/06/2013   Formatting of this note might be different from the original. STORY: 10/2008 and hospitalized 01/2013 with positive troponins but widely patent arteries on cath. Formatting of this note might be different from the original. STORY: 10/2008 and hospitalized 01/2013 with positive troponins but widely patent arteries on cath. Formatting of this note might be different from the original. 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  Constitutional:      General: He is not in acute distress.    Appearance: Normal appearance. He is not ill-appearing.  Cardiovascular:     Rate and Rhythm: Normal rate and regular rhythm.     Pulses: Normal pulses.     Heart sounds: Normal heart sounds.  Pulmonary:     Effort: Pulmonary effort is normal.     Breath sounds: Normal breath sounds.  Musculoskeletal:     Cervical back: Normal range of motion and neck supple.     Right lower leg: No edema.     Left lower leg: No edema.  Skin:    General: Skin is warm and dry.  Neurological:     Mental Status: He is alert and oriented to person, place, and time.  Psychiatric:        Mood and Affect: Mood normal.        Behavior: Behavior normal.        Thought Content: Thought content normal.        Judgment: Judgment normal.     Diabetic foot exam was performed.  No deformities or other abnormal visual findings.  Posterior tibialis and dorsalis pulse intact bilaterally.  Intact to touch and monofilament testing bilaterally.          Assessment & Plan:     Problem List Items Addressed This Visit     CAD (coronary artery disease) (Chronic)    Following with cardiology No acute concerns.  Continue current regimen         Hyperlipidemia (Chronic)    Currently on rosuvastatin, Omega-3 Following with  cardiology  Lifestyle factors for lowering cholesterol include: Diet therapy - heart-healthy diet rich in fruits, veggies, fiber-rich whole grains, lean meats, chicken, fish (at least twice a week), fat-free or 1% dairy products; foods low in saturated/trans fats, cholesterol, sodium, and sugar. Mediterranean diet has shown to be very heart healthy. Regular exercise - recommend at least 30 minutes a day, 5 times per week Weight management        Type 2 diabetes mellitus with other specified complication (HCC) - Primary (Chronic)    Stable. A1c 6% last fall. Recheck today. Continue metformin and lifestyle measures       Relevant Orders   HgB A1c (Completed)   Microalbumin / creatinine urine ratio (  Completed)   Comprehensive metabolic panel (Completed)   Essential hypertension (Chronic)    Blood pressure is at goal for age and co-morbidities.   Recommendations: continue coreg and lisinopril - BP goal <130/80 - monitor and log blood pressures at home - check around the same time each day in a relaxed setting - Limit salt to <2000 mg/day - Follow DASH eating plan (heart healthy diet) - limit alcohol to 2 standard drinks per day for men and 1 per day for women - avoid tobacco products - get at least 2 hours of regular aerobic exercise weekly Patient aware of signs/symptoms requiring further/urgent evaluation.        Relevant Orders   Microalbumin / creatinine urine ratio (Completed)   Comprehensive metabolic panel (Completed)   MDD (major depressive disorder), recurrent episode, moderate (HCC) (Chronic)    Currently on Celexa Denies active SI/HI Patient aware of signs/symptoms requiring further/urgent evaluation.       Hepatic cirrhosis (HCC) (Chronic)    Following with [facility] in [place] - recommend liver US every 6 months Aware of lifestyle modification       Other Visit Diagnoses     Diabetes mellitus treated with oral medication (HCC)       Relevant  Orders   HgB A1c (Completed)   Microalbumin / creatinine urine ratio (Completed)   Comprehensive metabolic panel (Completed)      Orders Placed This Encounter  Procedures   HgB A1c   Microalbumin / creatinine urine ratio   Comprehensive metabolic panel          Clayborne Dana, NP

## 2023-02-28 NOTE — Assessment & Plan Note (Signed)
Following with [facility] in [place] - recommend liver US every 6 months Aware of lifestyle modification

## 2023-02-28 NOTE — Assessment & Plan Note (Signed)
Currently on Celexa Denies active SI/HI Patient aware of signs/symptoms requiring further/urgent evaluation.

## 2023-02-28 NOTE — Progress Notes (Signed)
Subjective:   Martin Frank is a 67 y.o. male who presents for an Initial Medicare Annual Wellness Visit.  Visit Complete: In person   Review of Systems     Cardiac Risk Factors include: advanced age (>62men, >75 women);diabetes mellitus;dyslipidemia;male gender;hypertension     Objective:    Today's Vitals   02/28/23 0849  BP: 132/89  Pulse: 61  Weight: 222 lb 9.6 oz (101 kg)  Height: 6' 2.5" (1.892 m)   Body mass index is 28.2 kg/m.     02/28/2023    9:10 AM 09/14/2022   11:59 AM 07/17/2022    4:38 PM 12/26/2017   12:32 AM 12/05/2017    8:01 PM  Advanced Directives  Does Patient Have a Medical Advance Directive? Yes No No No No  Type of Estate agent of [place];Living will      Does patient want to make changes to medical advance directive? No - Patient declined      Copy of Healthcare Power of Attorney in Chart? No - copy requested      Would patient like information on creating a medical advance directive?  No - Patient declined No - Patient declined  No - Patient declined    Current Medications (verified) Outpatient Encounter Medications as of 02/28/2023  Medication Sig   acetaminophen (TYLENOL) 500 MG tablet Take 500 mg by mouth 2 (two) times daily as needed for moderate pain or headache.   aspirin EC 81 MG tablet Take 1 tablet (81 mg total) by mouth daily. Swallow whole.   busPIRone (BUSPAR) 10 MG tablet TAKE 1 TABLET BY MOUTH THREE TIMES A DAY AS NEEDED   carvedilol (COREG) 3.125 MG tablet TAKE 1 TABLET BY MOUTH TWICE A DAY WITH FOOD   citalopram (CELEXA) 40 MG tablet TAKE 1 TABLET BY MOUTH EVERY DAY   lisinopril (ZESTRIL) 2.5 MG tablet Take 1 tablet (2.5 mg total) by mouth daily.   metFORMIN (GLUCOPHAGE) 500 MG tablet TAKE 1 TABLET BY MOUTH TWICE A DAY WITH FOOD   nitroGLYCERIN (NITROSTAT) 0.4 MG SL tablet PLACE 1 TABLET (0.4 MG TOTAL) UNDER THE TONGUE EVERY 5 (FIVE) MINUTES AS NEEDED FOR CHEST PAIN (MAY REPEAT X 3 THAN CALL 911).    Omega-3 Fatty Acids (FISH OIL) 1000 MG CPDR Take 1,000 mg by mouth daily.   pantoprazole (PROTONIX) 40 MG tablet Take 1 tablet (40 mg total) by mouth daily. Due for appt 03/2023   rosuvastatin (CRESTOR) 40 MG tablet Take 1 tablet (40 mg total) by mouth daily.   No facility-administered encounter medications on file as of 02/28/2023.    Allergies (verified) Bee venom   History: Past Medical History:  Diagnosis Date   Anterior myocardial infarction (HCC) 11/2008   CAD (coronary artery disease)    remote anterior MI with VT and DES to the LAD in May of 2010; NSTEMI July 2014 in [place] with cath showing stent patency, EF of 35 to 40% and possible vasospasm versus potential small vessel disease.    Cardiomyopathy, ischemic 05/07/2013   Chronic ischemic heart disease 04/06/2013   Formatting of this note might be different from the original. STORY: 10/2008 and hospitalized 01/2013 with positive troponins but widely patent arteries on cath. Formatting of this note might be different from the original. STORY: 10/2008 and hospitalized 01/2013 with positive troponins but widely patent arteries on cath. Formatting of this note might be different from the original. 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   Type II diabetes  mellitus [facility])    Past Surgical History:  Procedure Laterality Date   CARDIAC CATHETERIZATION  X2   CARDIAC VALVE REPLACEMENT     COLONOSCOPY     Multiple-last 2021 history of polyps   CORONARY ANGIOGRAPHY N/A 07/19/2022   Procedure: CORONARY ANGIOGRAPHY;  Surgeon: Tonny Bollman, MD;  Location: [facility] INVASIVE CV LAB;  Service: Cardiovascular;  Laterality: N/A;   CORONARY ANGIOPLASTY WITH STENT PLACEMENT  5/2010X 2   "1+1" (12/05/2017)   LAPAROSCOPIC CHOLECYSTECTOMY     LEFT HEART CATH AND CORONARY ANGIOGRAPHY N/A 12/06/2017   Procedure: LEFT HEART CATH AND CORONARY ANGIOGRAPHY;  Surgeon: Runell Gess, MD;  Location: [facility] INVASIVE CV LAB;  Service: Cardiovascular;  Laterality: N/A;   ORCHIECTOMY Right 1960s   Family History  Problem Relation Age of Onset   Diabetes Mother    Rheum arthritis Mother    CAD Father        MI at age 52   CAD Brother        MI at age 40   Post-traumatic stress disorder Brother    Suicidality Brother    Cancer Brother 27       Brain Tumor   Diabetes Brother    Atrial fibrillation Son    Stomach cancer Neg Hx    Colon cancer Neg Hx    Esophageal cancer Neg Hx    Social History   Socioeconomic History   Marital status: Married    Spouse name: Not on file   Number of children: 2   Years of education: Not on file   Highest education level: Not on file  Occupational History   Occupation: Teacher, adult education    Comment: Teacher, adult education   Occupation: Airline pilot  Tobacco Use   Smoking status: Former    Current packs/day: 0.00    Average packs/day: 1 pack/day for 10.0 years (10.0 ttl pk-yrs)    Types: Cigarettes    Start date: 08/01/1971    Quit date: 07/31/1981    Years since quitting: 41.6   Smokeless tobacco: Never  Vaping Use   Vaping status: Never Used  Substance and Sexual Activity   Alcohol use: Not Currently    Alcohol/week: 3.0 standard drinks of alcohol    Types: 3 Glasses of wine per week   Drug use: Not Currently    Types: Marijuana     Comment: 12/05/2017 "nothing since my early 20's"   Sexual activity: Yes  Other Topics Concern   Not on file  Social History Narrative   Married, 43 years as of 2023   Angelique Blonder   Employed in water filter sales currently [place]   Prior alcohol approximately 7 to 10/week maximum stopped in October 2020   5 caffeinated beverages daily  Former smoker no current tobacco or drug use   Social Determinants of Health   Financial Resource Strain: Patient Declined (02/27/2023)   Overall Financial Resource Strain (CARDIA)    Difficulty of Paying Living Expenses: Patient declined  Food Insecurity: Patient Declined (02/27/2023)   Hunger Vital Sign    Worried About Running Out of Food in the Last Year: Patient declined    Ran Out of Food in the Last Year: Patient declined  Transportation Needs: Patient Declined (02/27/2023)   PRAPARE - Administrator, Civil Service (Medical): Patient declined    Lack of Transportation (Non-Medical): Patient declined  Physical Activity: Unknown (02/27/2023)   Exercise Vital Sign    Days of Exercise per Week: Patient declined    Minutes of Exercise per Session: Not on file  Stress: Stress Concern Present (12/01/2021)   Received from [facility] - Occupational Stress Questionnaire    Feeling of Stress : To some extent  Social Connections: Unknown (02/27/2023)   Social Connection and Isolation Panel [NHANES]    Frequency of Communication with Friends and Family: Patient declined    Frequency of Social Gatherings with Friends and Family: Patient declined    Attends Religious Services: Patient declined    Database administrator or Organizations: Patient declined    Attends Engineer, structural: Not on file    Marital Status: Patient declined    Tobacco Counseling Counseling given: Not Answered   Clinical Intake:  Pre-visit preparation completed: Yes  Pain : No/denies pain  BMI - recorded:  28.2 Nutritional Status: BMI 25 -29 Overweight Nutritional Risks: None Diabetes: Yes CBG done?: No Did pt. bring in CBG monitor from home?: No  How often do you need to have someone help you when you read instructions, pamphlets, or other written materials from your doctor or pharmacy?: 1 - Never  Interpreter Needed?: No  Information entered by :: Donne Anon, CMA   Activities of Daily Living    02/28/2023    9:10 AM  In your present state of health, do you have any difficulty performing the following activities:  Hearing? 0  Vision? 0  Difficulty concentrating or making decisions? 0  Walking or climbing stairs? 0  Dressing or bathing? 0  Doing errands, shopping? 0  Preparing Food and eating ? N  Using the Toilet? N  In the past six months, have you accidently leaked urine? N  Do you have problems with loss of bowel control? N  Managing your Medications? N  Managing your Finances? N  Housekeeping or managing your Housekeeping? N    Patient Care Team: Clayborne Dana, NP as PCP - General (Family Medicine) [name] Madolyn Frieze, MD as PCP - Cardiology (Cardiology)  Indicate any recent Medical Services you may have received from other than [facility] providers in the past year (date may be approximate).     Assessment:   This is a routine wellness examination for [place].  Hearing/Vision screen No results found.  Dietary issues and exercise activities discussed:     Goals Addressed   None    Depression Screen    02/28/2023    9:08 AM 09/28/2022    2:39 PM 06/01/2022    2:44 PM 05/15/2022    8:40 AM 12/13/2021    8:35 AM 12/13/2021    7:57 AM  PHQ 2/9 Scores  PHQ - 2 Score 0 0 6 2 3 3   PHQ- 9 Score  0  24 7 9 9     Fall Risk    02/28/2023    9:09 AM 09/28/2022    2:39 PM 02/13/2022    8:27 AM 12/13/2021    7:55 AM  Fall Risk   Falls in the past year? 0 0 0 0  Number falls in past yr: 0 0 0 0  Injury with Fall? 0 0 0 0  Risk for fall due to : No Fall Risks No Fall  Risks  No Fall Risks  Follow up Falls evaluation completed   Falls evaluation completed    MEDICARE RISK AT HOME:  Medicare Risk at Home - 02/28/23 0912     Any stairs in or around the home? Yes    If so, are there any without handrails? No    Home free of loose throw rugs in walkways, pet beds, electrical cords, etc? No    Adequate lighting in your home to reduce risk of falls? No    Life alert? No    Use of a cane, walker or w/c? No    Grab bars in the bathroom? Yes    Shower chair or bench in shower? No    Elevated toilet seat or a handicapped toilet? Yes   comfort height            TIMED UP AND GO:  Was the test performed? Yes  Length of time to ambulate 10 feet: 5 sec Gait steady and fast without use of assistive device    Cognitive Function:        02/28/2023    9:13 AM  6CIT Screen  What Year? 0 points  What month? 0 points  What time? 0 points  Count back from 20 0 points  Months in reverse 0 points  Repeat phrase 0 points  Total Score 0 points    Immunizations Immunization History  Administered Date(s) Administered   Fluad Quad(high Dose 65+) 05/15/2022   Influenza Inj Mdck Quad Pf 07/29/2017   Influenza Split 08/06/2007, 08/16/2011, 05/06/2015, 07/29/2017   Influenza,inj,Quad PF,6+ Mos 04/26/2020   Influenza-Unspecified 08/06/2007, 08/16/2011, 05/06/2015, 07/29/2017, 05/18/2019   Moderna Sars-Covid-2 Vaccination 11/10/2019, 12/08/2019   PNEUMOCOCCAL CONJUGATE-20 05/15/2022   Pneumococcal Polysaccharide-23 04/06/2015    TDAP status: Up to date  Flu Vaccine status: Up to date  Pneumococcal vaccine status: Up to date  Covid-19 vaccine status: Information provided on how to obtain vaccines.   Qualifies for Shingles Vaccine? Yes   Zostavax completed No   Shingrix Completed?: No.    Education has been provided regarding the importance of this vaccine. Patient has been advised to call insurance company to determine out of pocket expense if they  have not yet received this vaccine. Advised may also receive vaccine at local pharmacy or Health Dept. Verbalized acceptance and understanding.  Screening Tests Health Maintenance  Topic Date Due   Medicare Annual Wellness (AWV)  Never done   OPHTHALMOLOGY EXAM  Never done   HEMOGLOBIN A1C  11/14/2022   Diabetic kidney evaluation - Urine ACR  02/14/2023   COVID-19 Vaccine (3 - 2023-24 season) 02/27/2024 (Originally 03/31/2022)   INFLUENZA VACCINE  03/01/2023   Diabetic kidney evaluation - eGFR measurement  08/03/2023   FOOT EXAM  02/28/2024   Colonoscopy  09/01/2027   Pneumonia Vaccine 63+ Years old  Completed   Hepatitis C Screening  Completed   HPV VACCINES  Aged Out   DTaP/Tdap/Td  Discontinued   Zoster Vaccines- Shingrix  Discontinued    Health Maintenance  Health Maintenance Due  Topic Date Due   Medicare Annual Wellness (AWV)  Never done   OPHTHALMOLOGY EXAM  Never done   HEMOGLOBIN A1C  11/14/2022   Diabetic kidney evaluation - Urine ACR  02/14/2023    Colorectal cancer screening: Type of screening: Colonoscopy. Completed 08/31/22. Repeat every 5 years  Lung Cancer Screening: (Low Dose CT Chest recommended if Age 88-80 years, 20 pack-year currently smoking OR have quit w/in 15years.) does not qualify.   Additional Screening:  Hepatitis C Screening: does qualify; Completed 07/06/22  Vision Screening: Recommended annual ophthalmology exams for early detection of glaucoma and other disorders of the eye. Is the patient up to date with their annual eye exam?  Yes  Who is the provider or what is the name of the office in which the patient attends annual eye exams? Can't remember name at this time If pt is not established with a provider, would they like to be referred to a provider to establish care? No .   Dental Screening: Recommended annual dental exams for proper oral hygiene  Diabetic Foot Exam: Diabetic Foot Exam: Overdue, Pt has been advised about the importance in  completing this exam. Pt is scheduled for diabetic foot exam on N/a.  Community Resource Referral / Chronic Care Management: CRR required this visit?  No   CCM required this visit?  No    Plan:     I have personally reviewed and noted the following in the patient's chart:   Medical and social history Use of alcohol, tobacco or illicit drugs  Current medications and supplements including opioid prescriptions. Patient is not currently taking opioid prescriptions. Functional ability and status Nutritional status Physical activity Advanced directives List of other physicians Hospitalizations, surgeries, and ER visits in previous 12 months Vitals Screenings to include cognitive, depression, and falls Referrals and appointments  In addition, I have reviewed and discussed with patient certain preventive protocols, quality metrics, and best practice recommendations. A written personalized care plan for preventive services as well as general preventive health recommendations were provided to patient.     Donne Anon, CMA   02/28/2023   After Visit Summary: Sent to mychart.  Nurse Notes: None

## 2023-02-28 NOTE — Patient Instructions (Signed)
[name] , Thank you for taking time to come for your Medicare Wellness Visit. I appreciate your ongoing commitment to your health goals. Please review the following plan we discussed and let me know if I can assist you in the future.   These are the goals we discussed:  Goals   None     This is a list of the screening recommended for you and due dates:  Health Maintenance  Topic Date Due   Eye exam for diabetics  Never done   Hemoglobin A1C  11/14/2022   Yearly kidney health urinalysis for diabetes  02/14/2023   COVID-19 Vaccine (3 - 2023-24 season) 02/27/2024*   Flu Shot  03/01/2023   Yearly kidney function blood test for diabetes  08/03/2023   Complete foot exam   02/28/2024   Medicare Annual Wellness Visit  02/28/2024   Colon Cancer Screening  09/01/2027   Pneumonia Vaccine  Completed   Hepatitis C Screening  Completed   HPV Vaccine  Aged Out   DTaP/Tdap/Td vaccine  Discontinued   Zoster (Shingles) Vaccine  Discontinued  *Topic was postponed. The date shown is not the original due date.     Next appointment: Follow up in one year for your annual wellness visit.   Preventive Care 86 Years and Older, Male Preventive care refers to lifestyle choices and visits with your health care provider that can promote health and wellness. What does preventive care include? A yearly physical exam. This is also called an annual well check. Dental exams once or twice a year. Routine eye exams. Ask your health care provider how often you should have your eyes checked. Personal lifestyle choices, including: Daily care of your teeth and gums. Regular physical activity. Eating a healthy diet. Avoiding tobacco and drug use. Limiting alcohol use. Practicing safe sex. Taking low doses of aspirin every day. Taking vitamin and mineral supplements as recommended by your health care provider. What happens during an annual well check? The services and screenings done by your health care  provider during your annual well check will depend on your age, overall health, lifestyle risk factors, and family history of disease. Counseling  Your health care provider may ask you questions about your: Alcohol use. Tobacco use. Drug use. Emotional well-being. Home and relationship well-being. Sexual activity. Eating habits. History of falls. Memory and ability to understand (cognition). Work and work Astronomer. Screening  You may have the following tests or measurements: Height, weight, and BMI. Blood pressure. Lipid and cholesterol levels. These may be checked every 5 years, or more frequently if you are over 31 years old. Skin check. Lung cancer screening. You may have this screening every year starting at age 58 if you have a 30-pack-year history of smoking and currently smoke or have quit within the past 15 years. Fecal occult blood test (FOBT) of the stool. You may have this test every year starting at age 26. Flexible sigmoidoscopy or colonoscopy. You may have a sigmoidoscopy every 5 years or a colonoscopy every 10 years starting at age 65. Prostate cancer screening. Recommendations will vary depending on your family history and other risks. Hepatitis C blood test. Hepatitis B blood test. Sexually transmitted disease (STD) testing. Diabetes screening. This is done by checking your blood sugar (glucose) after you have not eaten for a while (fasting). You may have this done every 1-3 years. Abdominal aortic aneurysm (AAA) screening. You may need this if you are a current or former smoker. Osteoporosis. You may be  screened starting at age 64 if you are at high risk. Talk with your health care provider about your test results, treatment options, and if necessary, the need for more tests. Vaccines  Your health care provider may recommend certain vaccines, such as: Influenza vaccine. This is recommended every year. Tetanus, diphtheria, and acellular pertussis (Tdap, Td)  vaccine. You may need a Td booster every 10 years. Zoster vaccine. You may need this after age 6. Pneumococcal 13-valent conjugate (PCV13) vaccine. One dose is recommended after age 47. Pneumococcal polysaccharide (PPSV23) vaccine. One dose is recommended after age 56. Talk to your health care provider about which screenings and vaccines you need and how often you need them. This information is not intended to replace advice given to you by your health care provider. Make sure you discuss any questions you have with your health care provider. Document Released: 08/13/2015 Document Revised: 04/05/2016 Document Reviewed: 05/18/2015 Elsevier Interactive Patient Education  2017 [employer].  Fall Prevention in the Home Falls can cause injuries. They can happen to people of all ages. There are many things you can do to make your home safe and to help prevent falls. What can I do on the outside of my home? Regularly fix the edges of walkways and driveways and fix any cracks. Remove anything that might make you trip as you walk through a door, such as a raised step or threshold. Trim any bushes or trees on the path to your home. Use bright outdoor lighting. Clear any walking paths of anything that might make someone trip, such as rocks or tools. Regularly check to see if handrails are loose or broken. Make sure that both sides of any steps have handrails. Any raised decks and porches should have guardrails on the edges. Have any leaves, snow, or ice cleared regularly. Use sand or salt on walking paths during winter. Clean up any spills in your garage right away. This includes oil or grease spills. What can I do in the bathroom? Use night lights. Install grab bars by the toilet and in the tub and shower. Do not use towel bars as grab bars. Use non-skid mats or decals in the tub or shower. If you need to sit down in the shower, use a plastic, non-slip stool. Keep the floor dry. Clean up any  water that spills on the floor as soon as it happens. Remove soap buildup in the tub or shower regularly. Attach bath mats securely with double-sided non-slip rug tape. Do not have throw rugs and other things on the floor that can make you trip. What can I do in the bedroom? Use night lights. Make sure that you have a light by your bed that is easy to reach. Do not use any sheets or blankets that are too big for your bed. They should not hang down onto the floor. Have a firm chair that has side arms. You can use this for support while you get dressed. Do not have throw rugs and other things on the floor that can make you trip. What can I do in the kitchen? Clean up any spills right away. Avoid walking on wet floors. Keep items that you use a lot in easy-to-reach places. If you need to reach something above you, use a strong step stool that has a grab bar. Keep electrical cords out of the way. Do not use floor polish or wax that makes floors slippery. If you must use wax, use non-skid floor wax. Do not have  throw rugs and other things on the floor that can make you trip. What can I do with my stairs? Do not leave any items on the stairs. Make sure that there are handrails on both sides of the stairs and use them. Fix handrails that are broken or loose. Make sure that handrails are as long as the stairways. Check any carpeting to make sure that it is firmly attached to the stairs. Fix any carpet that is loose or worn. Avoid having throw rugs at the top or bottom of the stairs. If you do have throw rugs, attach them to the floor with carpet tape. Make sure that you have a light switch at the top of the stairs and the bottom of the stairs. If you do not have them, ask someone to add them for you. What else can I do to help prevent falls? Wear shoes that: Do not have high heels. Have rubber bottoms. Are comfortable and fit you well. Are closed at the toe. Do not wear sandals. If you use a  stepladder: Make sure that it is fully opened. Do not climb a closed stepladder. Make sure that both sides of the stepladder are locked into place. Ask someone to hold it for you, if possible. Clearly mark and make sure that you can see: Any grab bars or handrails. First and last steps. Where the edge of each step is. Use tools that help you move around (mobility aids) if they are needed. These include: Canes. Walkers. Scooters. Crutches. Turn on the lights when you go into a dark area. Replace any light bulbs as soon as they burn out. Set up your furniture so you have a clear path. Avoid moving your furniture around. If any of your floors are uneven, fix them. If there are any pets around you, be aware of where they are. Review your medicines with your doctor. Some medicines can make you feel dizzy. This can increase your chance of falling. Ask your doctor what other things that you can do to help prevent falls. This information is not intended to replace advice given to you by your health care provider. Make sure you discuss any questions you have with your health care provider. Document Released: 05/13/2009 Document Revised: 12/23/2015 Document Reviewed: 08/21/2014 Elsevier Interactive Patient Education  2017 [employer].

## 2023-02-28 NOTE — Assessment & Plan Note (Signed)
Stable. A1c 6% last fall. Recheck today. Continue metformin and lifestyle measures

## 2023-02-28 NOTE — Assessment & Plan Note (Signed)
Following with cardiology No acute concerns.  Continue current regimen

## 2023-02-28 NOTE — Assessment & Plan Note (Signed)
Blood pressure is at goal for age and co-morbidities.   Recommendations: continue coreg and lisinopril - BP goal <130/80 - monitor and log blood pressures at home - check around the same time each day in a relaxed setting - Limit salt to <2000 mg/day - Follow DASH eating plan (heart healthy diet) - limit alcohol to 2 standard drinks per day for men and 1 per day for women - avoid tobacco products - get at least 2 hours of regular aerobic exercise weekly Patient aware of signs/symptoms requiring further/urgent evaluation.

## 2023-03-03 ENCOUNTER — Encounter: Payer: Self-pay | Admitting: Family Medicine

## 2023-05-17 ENCOUNTER — Other Ambulatory Visit: Payer: Self-pay | Admitting: Family Medicine

## 2023-05-17 DIAGNOSIS — K219 Gastro-esophageal reflux disease without esophagitis: Secondary | ICD-10-CM

## 2023-05-29 ENCOUNTER — Ambulatory Visit: Payer: Medicare Other | Admitting: Family Medicine

## 2023-06-06 ENCOUNTER — Encounter: Payer: Self-pay | Admitting: Family Medicine

## 2023-06-06 ENCOUNTER — Ambulatory Visit: Payer: Medicare Other | Admitting: Family Medicine

## 2023-06-06 VITALS — BP 135/83 | HR 66 | Ht 74.5 in | Wt 225.0 lb

## 2023-06-06 DIAGNOSIS — Z7984 Long term (current) use of oral hypoglycemic drugs: Secondary | ICD-10-CM

## 2023-06-06 DIAGNOSIS — F331 Major depressive disorder, recurrent, moderate: Secondary | ICD-10-CM | POA: Diagnosis not present

## 2023-06-06 DIAGNOSIS — E1169 Type 2 diabetes mellitus with other specified complication: Secondary | ICD-10-CM

## 2023-06-06 DIAGNOSIS — E785 Hyperlipidemia, unspecified: Secondary | ICD-10-CM

## 2023-06-06 DIAGNOSIS — Z23 Encounter for immunization: Secondary | ICD-10-CM

## 2023-06-06 DIAGNOSIS — E119 Type 2 diabetes mellitus without complications: Secondary | ICD-10-CM

## 2023-06-06 DIAGNOSIS — I10 Essential (primary) hypertension: Secondary | ICD-10-CM

## 2023-06-06 LAB — COMPREHENSIVE METABOLIC PANEL
ALT: 26 U/L (ref 0–53)
AST: 25 U/L (ref 0–37)
Albumin: 4.1 g/dL (ref 3.5–5.2)
Alkaline Phosphatase: 84 U/L (ref 39–117)
BUN: 13 mg/dL (ref 6–23)
CO2: 27 meq/L (ref 19–32)
Calcium: 9.5 mg/dL (ref 8.4–10.5)
Chloride: 105 meq/L (ref 96–112)
Creatinine, Ser: 0.84 mg/dL (ref 0.40–1.50)
GFR: 90.85 mL/min (ref >60.00)
Glucose, Bld: 129 mg/dL — ABNORMAL HIGH (ref 70–99)
Potassium: 4.7 meq/L (ref 3.5–5.1)
Sodium: 139 meq/L (ref 135–145)
Total Bilirubin: 0.6 mg/dL (ref 0.2–1.2)
Total Protein: 6.8 g/dL (ref 6.0–8.3)

## 2023-06-06 LAB — CBC WITH DIFFERENTIAL/PLATELET
Basophils Absolute: 0.1 10*3/uL (ref 0.0–0.1)
Basophils Relative: 1.2 % (ref 0.0–3.0)
Eosinophils Absolute: 0.2 10*3/uL (ref 0.0–0.7)
Eosinophils Relative: 3.7 % (ref 0.0–5.0)
HCT: 43.2 % (ref 39.0–52.0)
Hemoglobin: 14.1 g/dL (ref 13.0–17.0)
Lymphocytes Relative: 24 % (ref 12.0–46.0)
Lymphs Abs: 1.5 10*3/uL (ref 0.7–4.0)
MCHC: 32.7 g/dL (ref 30.0–36.0)
MCV: 92.4 fL (ref 78.0–100.0)
Monocytes Absolute: 0.5 10*3/uL (ref 0.1–1.0)
Monocytes Relative: 8.8 % (ref 3.0–12.0)
Neutro Abs: 3.8 10*3/uL (ref 1.4–7.7)
Neutrophils Relative %: 62.3 % (ref 43.0–77.0)
Platelets: 185 10*3/uL (ref 150.0–400.0)
RBC: 4.68 Mil/uL (ref 4.22–5.81)
RDW: 13.7 % (ref 11.5–15.5)
WBC: 6.2 10*3/uL (ref 4.0–10.5)

## 2023-06-06 LAB — LIPID PANEL
Cholesterol: 159 mg/dL (ref 0–200)
HDL: 64.9 mg/dL (ref >39.00)
LDL Cholesterol: 76 mg/dL (ref 0–99)
NonHDL: 93.62
Total CHOL/HDL Ratio: 2
Triglycerides: 86 mg/dL (ref 0.0–149.0)
VLDL: 17.2 mg/dL (ref 0.0–40.0)

## 2023-06-06 LAB — HEMOGLOBIN A1C: Hgb A1c MFr Bld: 6.3 % (ref 4.6–6.5)

## 2023-06-06 LAB — TSH: TSH: 1.84 u[IU]/mL (ref 0.35–5.50)

## 2023-06-06 MED ORDER — OZEMPIC (0.25 OR 0.5 MG/DOSE) 2 MG/3ML ~~LOC~~ SOPN: 0.2500 mg | PEN_INJECTOR | SUBCUTANEOUS | 2 refills | Status: DC

## 2023-06-06 MED ORDER — BUSPIRONE HCL 10 MG PO TABS
10.0000 mg | ORAL_TABLET | Freq: Three times a day (TID) | ORAL | 1 refills | Status: DC | PRN
Start: 2023-06-06 — End: 2023-11-12

## 2023-06-06 NOTE — Assessment & Plan Note (Signed)
Blood pressure is at goal for age and co-morbidities.   Recommendations: continue coreg and lisinopril - BP goal <130/80 - monitor and log blood pressures at home - check around the same time each day in a relaxed setting - Limit salt to <2000 mg/day - Follow DASH eating plan (heart healthy diet) - limit alcohol to 2 standard drinks per day for men and 1 per day for women - avoid tobacco products - get at least 2 hours of regular aerobic exercise weekly Patient aware of signs/symptoms requiring further/urgent evaluation.

## 2023-06-06 NOTE — Assessment & Plan Note (Signed)
Stable. A1c 6.1% this summer. Recheck today. Continue metformin and lifestyle measures Consider starting GLP-1 pending labs

## 2023-06-06 NOTE — Progress Notes (Signed)
Established Patient Office Visit  Subjective    Patient ID: Martin Frank, male    DOB: Dec 23, 1955  Age: 67 y.o. MRN: [id number]  CC:  Chief Complaint  Patient presents with   Medical Management of Chronic Issues    HPI Martin Frank presents for 31-month follow-up, chronic disease management. Reports he has been doing fairly well, but has had increased stress/anxiety.    Hypertension: - Medications: carvedilol 3.125 mg BID, lisinopril 2.5 mg daily - Compliance: good - Checking BP at home: no - Denies any SOB, recurrent headaches, CP, vision changes, LE edema, dizziness, palpitations, or medication side effects.  Diabetes: - Checking BG at home: no - Medications: metformin 500 mg BID  - Compliance: good - Eye exam: UTD - requesting a copy - Foot exam: UTD - Microalbumin: UTD - Denies symptoms of hypoglycemia, polyuria, polydipsia, numbness extremities, foot ulcers/trauma, wounds that are not healing, medication side effects  Lab Results  Component Value Date   HGBA1C 6.1 02/28/2023   Mood disorder: - Diagnosis: depression/anxiety - Treatment: Celexa 40 mg daily, Buspar 10 mg TID PRN - Medication side effects: none - SI/HI: none - Update: Reports he is doing well overall but has had increased stress with new job. He is having some trouble falling asleep and staying asleep - he would like a referral to [facility] for mood management as he feels stress is a component. Reports good bedtime routine.   Hyperlipidemia: - medications: rosuvastatin 40 mg daily, Omega 3  - compliance: good - medication SEs: no The ASCVD Risk score (Arnett DK, et al., 2019) failed to calculate for the following reasons:   The patient has a prior MI or stroke diagnosis -He is following with cardiology and the lipid clinic  Hepatic cirrhosis: - Patient is following with [facility] for weight loss and cirrhosis. Weight loss was suspected to be related to diet changes and reducing alcohol -reports his  weight has recently stabilized. He had a colonoscopy and upper GI endoscopy on 08/31/22 with following findings: diverticulosis in the sigmoid colon, internal hemorrhoids, and patchy moderate inflammation characterized by congestion (edema) and erythema was found in the gastric antrum. Biopsies were taken." Gastric biopsies revealed inflammation and vessel dilation." Liver biopsy was done 09/14/22 with biopsies - cirrhosis (compensated) uncertain etiology (autoimmune vs. MASH or both). He was referred to the [facility] in [place]. He established with Annamarie Major, NP on 12/06/22. He was advised to reduce risk factors. She suggested considering GLP-1. She advised regular liver US screenings. He is scheduled to follow-up with her again on November 11th.  -Asked patient about considering a GLP-1. He thought he had tried one in the past, but had trouble getting it due to cost. On chart review, looks like he may be talking about Jardiance. He is open to considering GLP-1, but would like to see labs first.       ROS All review of systems negative except what is listed in the HPI      Objective    BP 135/83   Pulse 66   Ht 6' 2.5" (1.892 m)   Wt 225 lb (102.1 kg)   SpO2 98%   BMI 28.50 kg/m   Physical Exam Vitals reviewed.  Constitutional:      General: He is not in acute distress.    Appearance: Normal appearance. He is not ill-appearing.  Cardiovascular:     Rate and Rhythm: Normal rate and regular rhythm.     Pulses:  Normal pulses.     Heart sounds: Normal heart sounds.  Pulmonary:     Effort: Pulmonary effort is normal.     Breath sounds: Normal breath sounds.  Musculoskeletal:     Cervical back: Normal range of motion and neck supple.     Right lower leg: No edema.     Left lower leg: No edema.  Skin:    General: Skin is warm and dry.  Neurological:     Mental Status: He is alert and oriented to person, place, and time.  Psychiatric:        Mood and Affect: Mood  normal.        Behavior: Behavior normal.        Thought Content: Thought content normal.        Judgment: Judgment normal.           Assessment & Plan:     Problem List Items Addressed This Visit       Active Problems   Hyperlipidemia (Chronic)    Currently on rosuvastatin 40 mg daily, Omega-3 Following with cardiology  Lifestyle factors for lowering cholesterol include: Diet therapy - heart-healthy diet rich in fruits, veggies, fiber-rich whole grains, lean meats, chicken, fish (at least twice a week), fat-free or 1% dairy products; foods low in saturated/trans fats, cholesterol, sodium, and sugar. Mediterranean diet has shown to be very heart healthy. Regular exercise - recommend at least 30 minutes a day, 5 times per week Weight management        Relevant Orders   Lipid panel   Comprehensive metabolic panel   Type 2 diabetes mellitus with other specified complication (HCC) - Primary (Chronic)    Stable. A1c 6.1% this summer. Recheck today. Continue metformin and lifestyle measures Consider starting GLP-1 pending labs        Relevant Orders   HgB A1c   CBC with Differential/Platelet   Comprehensive metabolic panel   Essential hypertension (Chronic)    Blood pressure is at goal for age and co-morbidities.   Recommendations: continue coreg and lisinopril - BP goal <130/80 - monitor and log blood pressures at home - check around the same time each day in a relaxed setting - Limit salt to <2000 mg/day - Follow DASH eating plan (heart healthy diet) - limit alcohol to 2 standard drinks per day for men and 1 per day for women - avoid tobacco products - get at least 2 hours of regular aerobic exercise weekly Patient aware of signs/symptoms requiring further/urgent evaluation.        Relevant Orders   TSH   Lipid panel   Comprehensive metabolic panel   MDD (major depressive disorder), recurrent episode, moderate (HCC) (Chronic)    Depression/Anxiety: Patient  reports difficulty staying asleep and mood changes, possibly related to a new job. Currently on Celexa and Buspar, with no side effects reported. No SI/HI -Patient requests referral to behavioral health for potential medication management and counseling. -Routine labs today -Sleep hygiene discussed      Relevant Medications   busPIRone (BUSPAR) 10 MG tablet   Other Visit Diagnoses     Diabetes mellitus treated with oral medication (HCC)       Relevant Orders   HgB A1c   CBC with Differential/Platelet   Flu vaccine need       Relevant Orders   Flu Vaccine Trivalent High Dose (Fluad) (Completed)      Orders Placed This Encounter  Procedures   Flu Vaccine Trivalent High Dose (Fluad)  HgB A1c   TSH   Lipid panel   CBC with Differential/Platelet   Comprehensive metabolic panel     Return in about 6 months (around 12/04/2023) for routine follow-up.   Clayborne Dana, NP

## 2023-06-06 NOTE — Addendum Note (Signed)
Addended by: Hyman Hopes B on: 06/06/2023 09:24 PM   Modules accepted: Orders

## 2023-06-06 NOTE — Assessment & Plan Note (Signed)
Currently on rosuvastatin 40 mg daily, Omega-3 Following with cardiology  Lifestyle factors for lowering cholesterol include: Diet therapy - heart-healthy diet rich in fruits, veggies, fiber-rich whole grains, lean meats, chicken, fish (at least twice a week), fat-free or 1% dairy products; foods low in saturated/trans fats, cholesterol, sodium, and sugar. Mediterranean diet has shown to be very heart healthy. Regular exercise - recommend at least 30 minutes a day, 5 times per week Weight management

## 2023-06-06 NOTE — Assessment & Plan Note (Signed)
Depression/Anxiety: Patient reports difficulty staying asleep and mood changes, possibly related to a new job. Currently on Celexa and Buspar, with no side effects reported. No SI/HI -Patient requests referral to behavioral health for potential medication management and counseling. -Routine labs today -Sleep hygiene discussed

## 2023-06-18 ENCOUNTER — Other Ambulatory Visit: Payer: Self-pay | Admitting: Family Medicine

## 2023-06-18 ENCOUNTER — Other Ambulatory Visit (HOSPITAL_COMMUNITY): Payer: Self-pay

## 2023-06-18 ENCOUNTER — Encounter: Payer: Self-pay | Admitting: Family Medicine

## 2023-06-18 DIAGNOSIS — I2089 Other forms of angina pectoris: Secondary | ICD-10-CM

## 2023-06-18 MED ORDER — NITROGLYCERIN 0.4 MG SL SUBL
0.4000 mg | SUBLINGUAL_TABLET | SUBLINGUAL | 3 refills | Status: DC | PRN
  Filled 2023-06-18: qty 25, 1d supply, fill #0

## 2023-06-18 MED ORDER — NITROGLYCERIN 0.4 MG SL SUBL: 0.4000 mg | SUBLINGUAL_TABLET | SUBLINGUAL | 3 refills | Status: AC | PRN

## 2023-06-18 NOTE — Progress Notes (Deleted)
HPI: FU coronary artery disease. Previously followed in [facility]. 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 He had a drug-eluting stent to his LAD in May of 2010. He apparently had nausea and vomiting and did not keep his Plavix down. He had stent thrombosis requiring a second intervention. Cardiac catheterization December 2023 showed patent LAD stent and minimal to mild obstructive plaquing in other vessels.  Echocardiogram December 2023 showed ejection fraction 35 to 40%, grade 1 diastolic dysfunction, mild RV dysfunction.  CTA December 2023 showed no aneurysm, dissection or pulmonary embolus.  Since he was last seen,   Current Outpatient Medications  Medication Sig Dispense Refill   acetaminophen (TYLENOL) 500 MG tablet Take 500 mg by mouth 2 (two) times daily as needed for moderate pain or headache.     aspirin EC 81 MG tablet Take 1 tablet (81 mg total) by mouth daily. Swallow whole. 90 tablet 1   busPIRone (BUSPAR) 10 MG tablet Take 1 tablet (10 mg total) by mouth 3 (three) times daily as needed. 270 tablet 1   carvedilol (COREG) 3.125 MG tablet TAKE 1 TABLET BY MOUTH TWICE A DAY WITH FOOD 180 tablet 3   citalopram (CELEXA) 40 MG tablet TAKE 1 TABLET BY MOUTH EVERY DAY 90 tablet 2   lisinopril (ZESTRIL) 2.5 MG tablet Take 1 tablet (2.5 mg total) by mouth daily. 90 tablet 3   metFORMIN (GLUCOPHAGE) 500 MG tablet TAKE 1 TABLET BY MOUTH TWICE A DAY WITH FOOD 180 tablet 1   nitroGLYCERIN (NITROSTAT) 0.4 MG SL tablet PLACE 1 TABLET (0.4 MG TOTAL) UNDER THE TONGUE EVERY 5 (FIVE) MINUTES AS NEEDED FOR CHEST PAIN (MAY REPEAT X 3 THAN CALL 911). 25 tablet 3   pantoprazole (PROTONIX) 40 MG tablet Take 1 tablet (40 mg total) by mouth daily. 90 tablet 1   rosuvastatin (CRESTOR) 40 MG tablet Take 1 tablet (40 mg total) by mouth daily. 90 tablet 3   Semaglutide,0.25 or 0.5MG /DOS, (OZEMPIC, 0.25 OR 0.5 MG/DOSE,) 2 MG/3ML SOPN Inject 0.25 mg into  the skin once a week. 3 mL 2   No current facility-administered medications for this visit.     Past Medical History:  Diagnosis Date   Anterior myocardial infarction [facility]) 11/2008   CAD (coronary artery disease)    remote anterior MI with VT and DES to the LAD in May of 2010; NSTEMI July 2014 in [place] with cath showing stent patency, EF of 35 to 40% and possible vasospasm versus potential small vessel disease.    Cardiomyopathy, ischemic 05/07/2013   Chronic ischemic heart disease 04/06/2013   Formatting of this note might be different from the original. STORY: 10/2008 and hospitalized 01/2013 with positive troponins but widely patent arteries on cath. Formatting of this note might be different from the original. STORY: 10/2008 and hospitalized 01/2013 with positive troponins but widely patent arteries on cath. Formatting of this note might be different from the original. STORY: 10/2008 and    Chronic systolic heart failure (HCC)    Depression    Former smoker 12/12/2017   GERD (gastroesophageal reflux disease) 4 years   H/O heart artery stent 05/20/2019   Hepatic cirrhosis (HCC) 10/02/2022   MASH vs autoimmune or both Referral to [facility] [place]   Hx of cardiovascular stress test 2015   Lexiscan Myoview (11/2013):  Anteroseptal, apical anterior, apex, apical inferior scar; no ischemia, EF 50%; Intermediate Risk   Hyperlipidemia  Hypertension    Ischemic cardiomyopathy    a.Echo (11/2013):  EF 45-50%, apical HK   MI (myocardial infarction) (HCC) ? 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   Type II diabetes mellitus [facility])     Past Surgical History:  Procedure Laterality Date   CARDIAC CATHETERIZATION  X2   CARDIAC VALVE REPLACEMENT     COLONOSCOPY     Multiple-last 2021 history of polyps   CORONARY ANGIOGRAPHY N/A 07/19/2022   Procedure: CORONARY ANGIOGRAPHY;  Surgeon: Tonny Bollman, MD;  Location: [facility] INVASIVE CV LAB;  Service: Cardiovascular;  Laterality: N/A;   CORONARY ANGIOPLASTY WITH STENT PLACEMENT  5/2010X 2   "1+1" (12/05/2017)   LAPAROSCOPIC CHOLECYSTECTOMY     LEFT HEART CATH AND CORONARY ANGIOGRAPHY N/A 12/06/2017   Procedure: LEFT HEART CATH AND CORONARY ANGIOGRAPHY;  Surgeon: Runell Gess, MD;  Location: [facility] INVASIVE CV LAB;  Service: Cardiovascular;  Laterality: N/A;   ORCHIECTOMY Right 1960s    Social History   Socioeconomic History   Marital status: Married    Spouse name: Not on file   Number of children: 2   Years of education: Not on file   Highest education level: Not on file  Occupational History   Occupation: Teacher, adult education    Comment: Teacher, adult education   Occupation: Airline pilot  Tobacco Use   Smoking status: Former    Current packs/day: 0.00    Average packs/day: 1 pack/day for 10.0 years (10.0 ttl pk-yrs)    Types: Cigarettes    Start date: 08/01/1971    Quit date: 07/31/1981    Years since quitting: 41.9   Smokeless tobacco: Never  Vaping Use   Vaping status: Never Used  Substance and Sexual Activity   Alcohol use: Not Currently    Alcohol/week: 3.0 standard drinks of alcohol    Types: 3 Glasses of wine per week   Drug use: Not Currently    Types: Marijuana    Comment: 12/05/2017 "nothing since my early 20's"   Sexual activity: Yes  Other Topics Concern   Not on file  Social History Narrative   Married, 43 years as of 2023   Angelique Blonder   Employed in water filter sales currently [place]   Prior alcohol approximately 7 to 10/week maximum stopped in October 2020   5 caffeinated beverages daily   Former smoker no current tobacco or drug use   Social Determinants of Corporate investment banker Strain: Low Risk  (06/05/2023)   Overall Financial Resource Strain (CARDIA)    Difficulty of Paying Living Expenses: Not hard at all  Food Insecurity: Patient Declined (06/05/2023)   Hunger Vital Sign    Worried About Running Out of Food  in the Last Year: Patient declined    Ran Out of Food in the Last Year: Patient declined  Transportation Needs: Patient Declined (06/05/2023)   PRAPARE - Administrator, Civil Service (Medical): Patient declined    Lack of Transportation (Non-Medical): Patient declined  Physical Activity: Sufficiently Active (06/05/2023)   Exercise Vital Sign    Days of Exercise per Week: 4 days    Minutes of Exercise per Session: 60 min  Stress: Stress Concern Present (06/05/2023)   [employer] of Occupational Health - Occupational Stress Questionnaire    Feeling of Stress : Rather much  Social Connections: Unknown (06/05/2023)   Social Connection and Isolation  Panel [NHANES]    Frequency of Communication with Friends and Family: Patient declined    Frequency of Social Gatherings with Friends and Family: Patient declined    Attends Religious Services: Patient declined    Database administrator or Organizations: Patient declined    Attends Banker Meetings: Not on file    Marital Status: Patient declined  Intimate Partner Violence: Not At Risk (02/28/2023)   Humiliation, Afraid, Rape, and Kick questionnaire    Fear of Current or Ex-Partner: No    Emotionally Abused: No    Physically Abused: No    Sexually Abused: No    Family History  Problem Relation Age of Onset   Diabetes Mother    Rheum arthritis Mother    CAD Father        MI at age 58   CAD Brother        MI at age 73   Post-traumatic stress disorder Brother    Suicidality Brother    Cancer Brother 41       Brain Tumor   Diabetes Brother    Atrial fibrillation Son    Stomach cancer Neg Hx    Colon cancer Neg Hx    Esophageal cancer Neg Hx     ROS: no fevers or chills, productive cough, hemoptysis, dysphasia, odynophagia, melena, hematochezia, dysuria, hematuria, rash, seizure activity, orthopnea, PND, pedal edema, claudication. Remaining systems are negative.  Physical Exam: Well-developed  well-nourished in no acute distress.  Skin is warm and dry.  HEENT is normal.  Neck is supple.  Chest is clear to auscultation with normal expansion.  Cardiovascular exam is regular rate and rhythm.  Abdominal exam nontender or distended. No masses palpated. Extremities show no edema. neuro grossly intact  ECG- personally reviewed  A/P  1 coronary artery disease-patient denies chest pain.  Continue aspirin and statin.  2 ischemic cardiomyopathy-plan to continue lisinopril.  He declined Entresto due to expense.  Continue carvedilol.  Continue Jardiance.  Repeat echocardiogram to see if LV function has improved.  3 hypertension-blood pressure controlled.  Continue medications as outlined above.  4 hyperlipidemia-continue Crestor and Zetia.  5 cirrhosis-followed by hepatology.  Olga Millers, MD

## 2023-06-22 ENCOUNTER — Ambulatory Visit: Payer: Medicare Other | Attending: Cardiology | Admitting: Cardiology

## 2023-06-25 ENCOUNTER — Encounter: Payer: Self-pay | Admitting: Cardiology

## 2023-06-29 DIAGNOSIS — K7469 Other cirrhosis of liver: Secondary | ICD-10-CM | POA: Diagnosis not present

## 2023-06-29 DIAGNOSIS — K3189 Other diseases of stomach and duodenum: Secondary | ICD-10-CM | POA: Diagnosis not present

## 2023-06-29 DIAGNOSIS — F1090 Alcohol use, unspecified, uncomplicated: Secondary | ICD-10-CM | POA: Diagnosis not present

## 2023-06-29 DIAGNOSIS — K766 Portal hypertension: Secondary | ICD-10-CM | POA: Diagnosis not present

## 2023-06-29 DIAGNOSIS — K760 Fatty (change of) liver, not elsewhere classified: Secondary | ICD-10-CM | POA: Diagnosis not present

## 2023-07-02 ENCOUNTER — Other Ambulatory Visit: Payer: Self-pay | Admitting: Nurse Practitioner

## 2023-07-02 DIAGNOSIS — K7469 Other cirrhosis of liver: Secondary | ICD-10-CM

## 2023-07-04 ENCOUNTER — Ambulatory Visit
Admission: RE | Admit: 2023-07-04 | Discharge: 2023-07-04 | Disposition: A | Discharge status: Home / Self Care | Payer: Medicare Other | Source: Ambulatory Visit | Attending: Nurse Practitioner | Admitting: Nurse Practitioner

## 2023-07-04 DIAGNOSIS — K7469 Other cirrhosis of liver: Secondary | ICD-10-CM

## 2023-07-04 DIAGNOSIS — K7460 Unspecified cirrhosis of liver: Secondary | ICD-10-CM | POA: Diagnosis not present

## 2023-07-07 ENCOUNTER — Other Ambulatory Visit: Payer: Self-pay | Admitting: Family Medicine

## 2023-07-07 DIAGNOSIS — I2510 Atherosclerotic heart disease of native coronary artery without angina pectoris: Secondary | ICD-10-CM

## 2023-07-07 DIAGNOSIS — I10 Essential (primary) hypertension: Secondary | ICD-10-CM

## 2023-07-10 ENCOUNTER — Other Ambulatory Visit: Payer: Self-pay | Admitting: Family Medicine

## 2023-07-10 DIAGNOSIS — F331 Major depressive disorder, recurrent, moderate: Secondary | ICD-10-CM

## 2023-08-13 ENCOUNTER — Other Ambulatory Visit: Payer: Self-pay | Admitting: Nurse Practitioner

## 2023-10-02 ENCOUNTER — Telehealth: Payer: Self-pay | Admitting: Family Medicine

## 2023-10-02 NOTE — Telephone Encounter (Signed)
 Pt came in and updated insurance info. They received a letter about their ozempic coverage and they want someone to resend it in/look into it.

## 2023-10-02 NOTE — Telephone Encounter (Signed)
 Sounds like they sent a temporary supply and are requiring authorization for further coverage. Can we get PA on Ozempic please?

## 2023-10-02 NOTE — Telephone Encounter (Signed)
 Was the letter saying it was no longer covered?

## 2023-10-04 ENCOUNTER — Telehealth: Payer: Self-pay

## 2023-10-04 ENCOUNTER — Other Ambulatory Visit (HOSPITAL_COMMUNITY): Payer: Self-pay

## 2023-10-04 NOTE — Telephone Encounter (Signed)
 Pharmacy Patient Advocate Encounter   Received notification from Pt Calls Messages that prior authorization for Ozempic (0.25 or 0.5 MG/DOSE) 2MG /3ML pen-injectors is required/requested.   Insurance verification completed.   The patient is insured through [facility] ADVANTAGE/RX ADVANCE .   Per test claim: PA required; PA submitted to above mentioned insurance via CoverMyMeds Key/confirmation #/EOC [id number] Status is pending

## 2023-10-04 NOTE — Telephone Encounter (Signed)
 PA request has been Submitted. New Encounter has been or will be created for follow up. For additional info see Pharmacy Prior Auth telephone encounter from 10/04/23.

## 2023-10-05 ENCOUNTER — Other Ambulatory Visit (HOSPITAL_COMMUNITY): Payer: Self-pay

## 2023-10-05 ENCOUNTER — Other Ambulatory Visit: Payer: Self-pay | Admitting: Family Medicine

## 2023-10-05 DIAGNOSIS — E785 Hyperlipidemia, unspecified: Secondary | ICD-10-CM

## 2023-10-05 DIAGNOSIS — I2510 Atherosclerotic heart disease of native coronary artery without angina pectoris: Secondary | ICD-10-CM

## 2023-10-05 NOTE — Telephone Encounter (Signed)
 Pharmacy Patient Advocate Encounter  Received notification from [facility] ADVANTAGE/RX ADVANCE that Prior Authorization for  Ozempic (0.25 or 0.5 MG/DOSE) 2MG /3ML pen-injectors  has been APPROVED from 10/04/23 to 10/03/24. Unable to obtain price due to refill too soon rejection, last fill date 09/19/23 next available fill date3/12/25   PA #/Case ID/Reference #: [id number]

## 2023-10-09 ENCOUNTER — Other Ambulatory Visit: Payer: Self-pay | Admitting: Family Medicine

## 2023-10-09 DIAGNOSIS — I2510 Atherosclerotic heart disease of native coronary artery without angina pectoris: Secondary | ICD-10-CM

## 2023-10-09 DIAGNOSIS — E785 Hyperlipidemia, unspecified: Secondary | ICD-10-CM

## 2023-10-14 ENCOUNTER — Other Ambulatory Visit: Payer: Self-pay | Admitting: Family Medicine

## 2023-10-14 DIAGNOSIS — K219 Gastro-esophageal reflux disease without esophagitis: Secondary | ICD-10-CM

## 2023-11-11 ENCOUNTER — Other Ambulatory Visit: Payer: Self-pay | Admitting: Family Medicine

## 2023-11-11 DIAGNOSIS — I2510 Atherosclerotic heart disease of native coronary artery without angina pectoris: Secondary | ICD-10-CM

## 2023-11-11 DIAGNOSIS — E785 Hyperlipidemia, unspecified: Secondary | ICD-10-CM

## 2023-11-11 DIAGNOSIS — F331 Major depressive disorder, recurrent, moderate: Secondary | ICD-10-CM

## 2023-11-26 ENCOUNTER — Other Ambulatory Visit: Payer: Self-pay | Admitting: Cardiology

## 2023-11-26 DIAGNOSIS — I10 Essential (primary) hypertension: Secondary | ICD-10-CM

## 2023-11-26 DIAGNOSIS — I2510 Atherosclerotic heart disease of native coronary artery without angina pectoris: Secondary | ICD-10-CM

## 2023-12-04 ENCOUNTER — Encounter: Payer: Self-pay | Admitting: Family Medicine

## 2023-12-04 ENCOUNTER — Ambulatory Visit (INDEPENDENT_AMBULATORY_CARE_PROVIDER_SITE_OTHER): Payer: Medicare Other | Admitting: Family Medicine

## 2023-12-04 VITALS — BP 121/80 | HR 68 | Ht 74.5 in | Wt 220.0 lb

## 2023-12-04 DIAGNOSIS — I10 Essential (primary) hypertension: Secondary | ICD-10-CM | POA: Diagnosis not present

## 2023-12-04 DIAGNOSIS — Z7985 Long-term (current) use of injectable non-insulin antidiabetic drugs: Secondary | ICD-10-CM | POA: Diagnosis not present

## 2023-12-04 DIAGNOSIS — I2583 Coronary atherosclerosis due to lipid rich plaque: Secondary | ICD-10-CM | POA: Diagnosis not present

## 2023-12-04 DIAGNOSIS — E1169 Type 2 diabetes mellitus with other specified complication: Secondary | ICD-10-CM

## 2023-12-04 DIAGNOSIS — E785 Hyperlipidemia, unspecified: Secondary | ICD-10-CM

## 2023-12-04 DIAGNOSIS — Z7984 Long term (current) use of oral hypoglycemic drugs: Secondary | ICD-10-CM

## 2023-12-04 DIAGNOSIS — K219 Gastro-esophageal reflux disease without esophagitis: Secondary | ICD-10-CM

## 2023-12-04 DIAGNOSIS — I2510 Atherosclerotic heart disease of native coronary artery without angina pectoris: Secondary | ICD-10-CM | POA: Diagnosis not present

## 2023-12-04 DIAGNOSIS — F331 Major depressive disorder, recurrent, moderate: Secondary | ICD-10-CM | POA: Diagnosis not present

## 2023-12-04 LAB — COMPREHENSIVE METABOLIC PANEL WITH GFR
ALT: 44 U/L (ref 0–53)
AST: 40 U/L — ABNORMAL HIGH (ref 0–37)
Albumin: 4.3 g/dL (ref 3.5–5.2)
Alkaline Phosphatase: 99 U/L (ref 39–117)
BUN: 15 mg/dL (ref 6–23)
CO2: 26 meq/L (ref 19–32)
Calcium: 9.5 mg/dL (ref 8.4–10.5)
Chloride: 103 meq/L (ref 96–112)
Creatinine, Ser: 0.93 mg/dL (ref 0.40–1.50)
GFR: 85.25 mL/min (ref >60.00)
Glucose, Bld: 134 mg/dL — ABNORMAL HIGH (ref 70–99)
Potassium: 4.3 meq/L (ref 3.5–5.1)
Sodium: 137 meq/L (ref 135–145)
Total Bilirubin: 0.8 mg/dL (ref 0.2–1.2)
Total Protein: 6.8 g/dL (ref 6.0–8.3)

## 2023-12-04 LAB — LIPID PANEL
Cholesterol: 149 mg/dL (ref 0–200)
HDL: 52.1 mg/dL (ref >39.00)
LDL Cholesterol: 76 mg/dL (ref 0–99)
NonHDL: 97.15
Total CHOL/HDL Ratio: 3
Triglycerides: 108 mg/dL (ref 0.0–149.0)
VLDL: 21.6 mg/dL (ref 0.0–40.0)

## 2023-12-04 LAB — HEMOGLOBIN A1C: Hgb A1c MFr Bld: 5.9 % (ref 4.6–6.5)

## 2023-12-04 MED ORDER — PANTOPRAZOLE SODIUM 40 MG PO TBEC: 40.0000 mg | DELAYED_RELEASE_TABLET | Freq: Every day | ORAL | 1 refills | Status: DC

## 2023-12-04 NOTE — Progress Notes (Signed)
 Established Patient Office Visit  Subjective    Patient ID: Martin Frank, male    DOB: 23-Dec-1955  Age: 68 y.o. MRN: [id number]  CC:  Chief Complaint  Patient presents with   Medical Management of Chronic Issues    HPI Martin Frank presents for 59-month follow-up, chronic disease management. Reports he has been doing well!   Hypertension: - Medications: carvedilol  3.125 mg BID, lisinopril  2.5 mg daily - Compliance: good - Checking BP at home: no - Denies any SOB, recurrent headaches, CP, vision changes, LE edema, dizziness, palpitations, or medication side effects.  Diabetes: - Checking BG at home: no - Medications: metformin  500 mg BID, Ozempic  0.25 mg/week (started 9-10 weeks ago, doing well at current dose) - Compliance: good - Eye exam: UTD - requesting a copy - Foot exam: UTD - Microalbumin: UTD - Denies symptoms of hypoglycemia, polyuria, polydipsia, numbness extremities, foot ulcers/trauma, wounds that are not healing, medication side effects  Lab Results  Component Value Date   HGBA1C 6.3 06/06/2023   Mood disorder: - Diagnosis: depression/anxiety - Treatment: Celexa  40 mg daily, Buspar  10 mg TID PRN - Medication side effects: none - SI/HI: none - Update: He is doing really well. No new concerns  Hyperlipidemia: - medications: rosuvastatin  40 mg daily - compliance: good - medication SEs: no The ASCVD Risk score (Arnett DK, et al., 2019) failed to calculate for the following reasons:   Risk score cannot be calculated because patient has a medical history suggesting prior/existing ASCVD -He is following with cardiology and the lipid clinic  Hepatic cirrhosis: - Patient is following with [facility] for weight loss and cirrhosis. Weight loss was suspected to be related to diet changes and reducing alcohol -reports his weight has recently stabilized. He had a colonoscopy and upper GI endoscopy on 08/31/22 with following findings: diverticulosis in the sigmoid  colon, internal hemorrhoids, and patchy moderate inflammation characterized by congestion (edema) and erythema was found in the gastric antrum. Biopsies were taken." Gastric biopsies revealed inflammation and vessel dilation." Liver biopsy was done 09/14/22 with biopsies - cirrhosis (compensated) uncertain etiology (autoimmune vs. MASH or both). [facility] was [facility] [place]. He established with Duey Ghent, NP on 12/06/22. He was advised to reduce risk factors. She suggested considering GLP-1. She advised regular liver US  screenings.  - 12/04/23 update: He was able to start Ozempic  in February 2025 once he changed insurances. Reports tolerating well on 0.25 mg/week, no side effects. He is due to follow-up at the liver clinic this summer.        12/04/2023    8:25 AM 06/06/2023    9:19 AM 02/28/2023    9:08 AM  PHQ9 SCORE ONLY  PHQ-9 Total Score 0 5 0      12/04/2023    8:25 AM 06/06/2023    9:21 AM 06/01/2022    2:44 PM 05/15/2022    8:41 AM  GAD 7 : Generalized Anxiety Score  Nervous, Anxious, on Edge 0 0 3 1  Control/stop worrying 0 0 3 1  Worry too much - different things 0 1 1 1   Trouble relaxing 0 1 1 1   Restless 0 0 0 0  Easily annoyed or irritable 0 0 1 0  Afraid - awful might happen 0 0 3 0  Total GAD 7 Score 0 2 12 4   Anxiety Difficulty Not difficult at all Not difficult at all Very difficult Not difficult at all  ROS All review of systems negative except what is listed in the HPI      Objective    BP 121/80   Pulse 68   Ht 6' 2.5" (1.892 m)   Wt 220 lb (99.8 kg)   SpO2 98%   BMI 27.87 kg/m   Physical Exam Vitals reviewed.  Constitutional:      General: He is not in acute distress.    Appearance: Normal appearance. He is not ill-appearing.  Cardiovascular:     Rate and Rhythm: Normal rate and regular rhythm.     Pulses: Normal pulses.     Heart sounds: Normal heart sounds.  Pulmonary:     Effort: Pulmonary effort is normal.      Breath sounds: Normal breath sounds.  Musculoskeletal:     Cervical back: Normal range of motion and neck supple.     Right lower leg: No edema.     Left lower leg: No edema.  Skin:    General: Skin is warm and dry.  Neurological:     Mental Status: He is alert and oriented to person, place, and time.  Psychiatric:        Mood and Affect: Mood normal.        Behavior: Behavior normal.        Thought Content: Thought content normal.        Judgment: Judgment normal.           Assessment & Plan:     Problem List Items Addressed This Visit       Active Problems   CAD (coronary artery disease) (Chronic)   Following with cardiology No acute concerns.  Continue current regimen         Hyperlipidemia (Chronic)   Currently on rosuvastatin  40 mg daily Following with cardiology  Lifestyle factors for lowering cholesterol include: Diet therapy - heart-healthy diet rich in fruits, veggies, fiber-rich whole grains, lean meats, chicken, fish (at least twice a week), fat-free or 1% dairy products; foods low in saturated/trans fats, cholesterol, sodium, and sugar. Mediterranean diet has shown to be very heart healthy. Regular exercise - recommend at least 30 minutes a day, 5 times per week Weight management        Relevant Orders   Lipid panel   Type 2 diabetes mellitus with other specified complication (HCC) - Primary (Chronic)   Stable. Recheck A1c today. Continue metformin , Ozempic  and lifestyle measures        Relevant Orders   Comprehensive metabolic panel with GFR   Hemoglobin A1c   Essential hypertension (Chronic)   Blood pressure is at goal for age and co-morbidities.   Recommendations: continue coreg  and lisinopril  - BP goal <130/80 - monitor and log blood pressures at home - check around the same time each day in a relaxed setting - Limit salt to <2000 mg/day - Follow DASH eating plan (heart healthy diet) - limit alcohol to 2 standard drinks per day for  men and 1 per day for women - avoid tobacco products - get at least 2 hours of regular aerobic exercise weekly Patient aware of signs/symptoms requiring further/urgent evaluation.        Relevant Orders   Comprehensive metabolic panel with GFR   MDD (major depressive disorder), recurrent episode, moderate (HCC) (Chronic)   On Celexa  and Buspar . Reports feeling well. No counseling needed. No SI/HI - Continue current medication regimen.      Gastroesophageal reflux disease   Stable with protonix  and lifestyle measures  Relevant Medications   pantoprazole  (PROTONIX ) 40 MG tablet   Orders Placed This Encounter  Procedures   Comprehensive metabolic panel with GFR   Hemoglobin A1c   Lipid panel     Return in about 6 months (around 06/05/2024) for physical.   Everlina Hock, NP

## 2023-12-04 NOTE — Assessment & Plan Note (Signed)
 Stable with protonix  and lifestyle measures

## 2023-12-04 NOTE — Assessment & Plan Note (Signed)
 On Celexa  and Buspar . Reports feeling well. No counseling needed. No SI/HI - Continue current medication regimen.

## 2023-12-04 NOTE — Assessment & Plan Note (Signed)
 Stable. Recheck A1c today. Continue metformin , Ozempic  and lifestyle measures

## 2023-12-04 NOTE — Assessment & Plan Note (Signed)
Following with cardiology No acute concerns.  Continue current regimen

## 2023-12-04 NOTE — Assessment & Plan Note (Signed)
 Currently on rosuvastatin  40 mg daily Following with cardiology  Lifestyle factors for lowering cholesterol include: Diet therapy - heart-healthy diet rich in fruits, veggies, fiber-rich whole grains, lean meats, chicken, fish (at least twice a week), fat-free or 1% dairy products; foods low in saturated/trans fats, cholesterol, sodium, and sugar. Mediterranean diet has shown to be very heart healthy. Regular exercise - recommend at least 30 minutes a day, 5 times per week Weight management

## 2023-12-04 NOTE — Assessment & Plan Note (Signed)
Blood pressure is at goal for age and co-morbidities.   Recommendations: continue coreg and lisinopril - BP goal <130/80 - monitor and log blood pressures at home - check around the same time each day in a relaxed setting - Limit salt to <2000 mg/day - Follow DASH eating plan (heart healthy diet) - limit alcohol to 2 standard drinks per day for men and 1 per day for women - avoid tobacco products - get at least 2 hours of regular aerobic exercise weekly Patient aware of signs/symptoms requiring further/urgent evaluation.

## 2023-12-09 ENCOUNTER — Other Ambulatory Visit: Payer: Self-pay | Admitting: Family Medicine

## 2023-12-09 DIAGNOSIS — E1169 Type 2 diabetes mellitus with other specified complication: Secondary | ICD-10-CM

## 2023-12-12 ENCOUNTER — Other Ambulatory Visit: Payer: Self-pay | Admitting: Family Medicine

## 2023-12-12 DIAGNOSIS — F331 Major depressive disorder, recurrent, moderate: Secondary | ICD-10-CM

## 2023-12-24 ENCOUNTER — Other Ambulatory Visit: Payer: Self-pay | Admitting: Cardiology

## 2023-12-24 DIAGNOSIS — I2510 Atherosclerotic heart disease of native coronary artery without angina pectoris: Secondary | ICD-10-CM

## 2023-12-24 DIAGNOSIS — I10 Essential (primary) hypertension: Secondary | ICD-10-CM

## 2023-12-29 ENCOUNTER — Other Ambulatory Visit: Payer: Self-pay | Admitting: Cardiology

## 2023-12-29 DIAGNOSIS — I2510 Atherosclerotic heart disease of native coronary artery without angina pectoris: Secondary | ICD-10-CM

## 2023-12-29 DIAGNOSIS — I10 Essential (primary) hypertension: Secondary | ICD-10-CM

## 2024-01-09 ENCOUNTER — Encounter: Payer: Self-pay | Admitting: Cardiology

## 2024-01-10 ENCOUNTER — Encounter: Payer: Self-pay | Admitting: Cardiology

## 2024-01-20 ENCOUNTER — Other Ambulatory Visit: Payer: Self-pay | Admitting: Cardiology

## 2024-01-20 DIAGNOSIS — I2510 Atherosclerotic heart disease of native coronary artery without angina pectoris: Secondary | ICD-10-CM

## 2024-01-20 DIAGNOSIS — I10 Essential (primary) hypertension: Secondary | ICD-10-CM

## 2024-01-21 ENCOUNTER — Other Ambulatory Visit: Payer: Self-pay | Admitting: Nurse Practitioner

## 2024-01-21 DIAGNOSIS — R413 Other amnesia: Secondary | ICD-10-CM | POA: Diagnosis not present

## 2024-01-21 DIAGNOSIS — K7469 Other cirrhosis of liver: Secondary | ICD-10-CM

## 2024-01-21 DIAGNOSIS — F1090 Alcohol use, unspecified, uncomplicated: Secondary | ICD-10-CM | POA: Diagnosis not present

## 2024-01-21 DIAGNOSIS — K3189 Other diseases of stomach and duodenum: Secondary | ICD-10-CM | POA: Diagnosis not present

## 2024-01-21 DIAGNOSIS — K766 Portal hypertension: Secondary | ICD-10-CM | POA: Diagnosis not present

## 2024-01-21 DIAGNOSIS — K760 Fatty (change of) liver, not elsewhere classified: Secondary | ICD-10-CM | POA: Diagnosis not present

## 2024-01-22 ENCOUNTER — Encounter: Payer: Self-pay | Admitting: Cardiology

## 2024-01-22 ENCOUNTER — Encounter: Payer: Self-pay | Admitting: Family Medicine

## 2024-01-22 DIAGNOSIS — I2510 Atherosclerotic heart disease of native coronary artery without angina pectoris: Secondary | ICD-10-CM

## 2024-01-22 DIAGNOSIS — I10 Essential (primary) hypertension: Secondary | ICD-10-CM

## 2024-01-23 MED ORDER — CARVEDILOL 3.125 MG PO TABS: 3.1250 mg | ORAL_TABLET | Freq: Two times a day (BID) | ORAL | 0 refills | Status: AC

## 2024-01-28 ENCOUNTER — Ambulatory Visit
Admission: RE | Admit: 2024-01-28 | Discharge: 2024-01-28 | Disposition: A | Discharge status: Home / Self Care | Source: Ambulatory Visit | Attending: Nurse Practitioner

## 2024-01-28 DIAGNOSIS — K7469 Other cirrhosis of liver: Secondary | ICD-10-CM

## 2024-01-28 DIAGNOSIS — K7460 Unspecified cirrhosis of liver: Secondary | ICD-10-CM | POA: Diagnosis not present

## 2024-01-29 NOTE — Telephone Encounter (Signed)
 Pt called back and cx his appt.for 7/10 with Dr Pietro.  He said our customer service is lacking.

## 2024-02-07 ENCOUNTER — Ambulatory Visit: Admitting: Cardiology

## 2024-02-27 ENCOUNTER — Other Ambulatory Visit: Payer: Self-pay | Admitting: Family Medicine

## 2024-02-27 ENCOUNTER — Encounter: Payer: Self-pay | Admitting: Family Medicine

## 2024-03-05 ENCOUNTER — Other Ambulatory Visit: Payer: Self-pay | Admitting: Family Medicine

## 2024-03-05 DIAGNOSIS — I10 Essential (primary) hypertension: Secondary | ICD-10-CM

## 2024-03-05 DIAGNOSIS — I2510 Atherosclerotic heart disease of native coronary artery without angina pectoris: Secondary | ICD-10-CM

## 2024-03-20 ENCOUNTER — Other Ambulatory Visit: Payer: Self-pay | Admitting: Family Medicine

## 2024-03-20 DIAGNOSIS — F331 Major depressive disorder, recurrent, moderate: Secondary | ICD-10-CM

## 2024-03-30 ENCOUNTER — Other Ambulatory Visit: Payer: Self-pay | Admitting: Family Medicine

## 2024-03-30 DIAGNOSIS — E1169 Type 2 diabetes mellitus with other specified complication: Secondary | ICD-10-CM

## 2024-03-30 DIAGNOSIS — I2510 Atherosclerotic heart disease of native coronary artery without angina pectoris: Secondary | ICD-10-CM

## 2024-03-30 DIAGNOSIS — E785 Hyperlipidemia, unspecified: Secondary | ICD-10-CM

## 2024-04-06 ENCOUNTER — Other Ambulatory Visit: Payer: Self-pay | Admitting: Family Medicine

## 2024-04-06 DIAGNOSIS — E1169 Type 2 diabetes mellitus with other specified complication: Secondary | ICD-10-CM

## 2024-04-16 ENCOUNTER — Other Ambulatory Visit: Payer: Self-pay | Admitting: Family Medicine

## 2024-04-16 DIAGNOSIS — K219 Gastro-esophageal reflux disease without esophagitis: Secondary | ICD-10-CM

## 2024-04-19 ENCOUNTER — Other Ambulatory Visit: Payer: Self-pay | Admitting: Family Medicine

## 2024-04-19 DIAGNOSIS — F331 Major depressive disorder, recurrent, moderate: Secondary | ICD-10-CM

## 2024-05-26 ENCOUNTER — Ambulatory Visit

## 2024-05-30 ENCOUNTER — Ambulatory Visit

## 2024-05-30 NOTE — Progress Notes (Signed)
 GARRIS MELHORN                                          MRN: [id number]   05/30/2024   The [facility] Quality Team Specialist reviewed this patient medical record for the purposes of chart review for care gap closure. The following were reviewed: chart review for care gap closure-kidney health evaluation for diabetes:eGFR  and uACR.    [facility]BCI Quality Team

## 2024-06-06 ENCOUNTER — Telehealth: Payer: Self-pay | Admitting: Family Medicine

## 2024-06-06 NOTE — Telephone Encounter (Signed)
 Copied from CRM [id number]. Topic: Medicare AWV >> Jun 06, 2024 11:30 AM Nathanel DEL wrote: Reason for CRM: Called 06/06/2024 to sched AWV - NO VOICEMAIL  Nathanel Paschal; Care Guide Ambulatory Clinical Support [place] [facility] Direct Dial: [phone]

## 2024-07-04 ENCOUNTER — Encounter: Payer: Self-pay | Admitting: Family Medicine

## 2024-07-04 DIAGNOSIS — F331 Major depressive disorder, recurrent, moderate: Secondary | ICD-10-CM

## 2024-07-04 MED ORDER — BUSPIRONE HCL 10 MG PO TABS: 10.0000 mg | ORAL_TABLET | Freq: Three times a day (TID) | ORAL | 0 refills | Status: DC | PRN

## 2024-07-11 ENCOUNTER — Ambulatory Visit: Admitting: Family Medicine

## 2024-07-11 ENCOUNTER — Encounter: Payer: Self-pay | Admitting: Family Medicine

## 2024-07-11 VITALS — BP 128/85 | HR 59 | Temp 98.0°F | Resp 16 | Ht 74.0 in | Wt 217.6 lb

## 2024-07-11 DIAGNOSIS — E1169 Type 2 diabetes mellitus with other specified complication: Secondary | ICD-10-CM

## 2024-07-11 DIAGNOSIS — I2510 Atherosclerotic heart disease of native coronary artery without angina pectoris: Secondary | ICD-10-CM

## 2024-07-11 DIAGNOSIS — I5022 Chronic systolic (congestive) heart failure: Secondary | ICD-10-CM

## 2024-07-11 DIAGNOSIS — F331 Major depressive disorder, recurrent, moderate: Secondary | ICD-10-CM

## 2024-07-11 DIAGNOSIS — K7469 Other cirrhosis of liver: Secondary | ICD-10-CM

## 2024-07-11 DIAGNOSIS — E785 Hyperlipidemia, unspecified: Secondary | ICD-10-CM

## 2024-07-11 DIAGNOSIS — Z23 Encounter for immunization: Secondary | ICD-10-CM

## 2024-07-11 DIAGNOSIS — I10 Essential (primary) hypertension: Secondary | ICD-10-CM

## 2024-07-11 LAB — COMPREHENSIVE METABOLIC PANEL WITH GFR
ALT: 23 U/L (ref 0–53)
AST: 27 U/L (ref 0–37)
Albumin: 4 g/dL (ref 3.5–5.2)
Alkaline Phosphatase: 69 U/L (ref 39–117)
BUN: 13 mg/dL (ref 6–23)
CO2: 29 meq/L (ref 19–32)
Calcium: 9.2 mg/dL (ref 8.4–10.5)
Chloride: 104 meq/L (ref 96–112)
Creatinine, Ser: 0.92 mg/dL (ref 0.40–1.50)
GFR: 86 mL/min (ref >60.00)
Glucose, Bld: 109 mg/dL — ABNORMAL HIGH (ref 70–99)
Potassium: 4.9 meq/L (ref 3.5–5.1)
Sodium: 138 meq/L (ref 135–145)
Total Bilirubin: 0.5 mg/dL (ref 0.2–1.2)
Total Protein: 6.4 g/dL (ref 6.0–8.3)

## 2024-07-11 LAB — HEMOGLOBIN A1C: Hgb A1c MFr Bld: 5.8 % (ref 4.6–6.5)

## 2024-07-11 LAB — LIPID PANEL
Cholesterol: 126 mg/dL (ref 0–200)
HDL: 56.6 mg/dL (ref >39.00)
LDL Cholesterol: 57 mg/dL (ref 0–99)
NonHDL: 68.96
Total CHOL/HDL Ratio: 2
Triglycerides: 62 mg/dL (ref 0.0–149.0)
VLDL: 12.4 mg/dL (ref 0.0–40.0)

## 2024-07-11 LAB — CBC WITH DIFFERENTIAL/PLATELET
Basophils Absolute: 0.1 K/uL (ref 0.0–0.1)
Basophils Relative: 1.8 % (ref 0.0–3.0)
Eosinophils Absolute: 0.3 K/uL (ref 0.0–0.7)
Eosinophils Relative: 5.2 % — ABNORMAL HIGH (ref 0.0–5.0)
HCT: 38.5 % — ABNORMAL LOW (ref 39.0–52.0)
Hemoglobin: 12.8 g/dL — ABNORMAL LOW (ref 13.0–17.0)
Lymphocytes Relative: 30.2 % (ref 12.0–46.0)
Lymphs Abs: 1.6 K/uL (ref 0.7–4.0)
MCHC: 33.2 g/dL (ref 30.0–36.0)
MCV: 88.3 fl (ref 78.0–100.0)
Monocytes Absolute: 0.5 K/uL (ref 0.1–1.0)
Monocytes Relative: 9.8 % (ref 3.0–12.0)
Neutro Abs: 2.8 K/uL (ref 1.4–7.7)
Neutrophils Relative %: 53 % (ref 43.0–77.0)
Platelets: 167 K/uL (ref 150.0–400.0)
RBC: 4.37 Mil/uL (ref 4.22–5.81)
RDW: 13.5 % (ref 11.5–15.5)
WBC: 5.4 K/uL (ref 4.0–10.5)

## 2024-07-11 LAB — MICROALBUMIN / CREATININE URINE RATIO
Creatinine,U: 107.7 mg/dL
Microalb Creat Ratio: 8 mg/g (ref 0.0–30.0)
Microalb, Ur: 0.9 mg/dL (ref 0.0–1.9)

## 2024-07-11 LAB — TSH: TSH: 2.23 u[IU]/mL (ref 0.35–5.50)

## 2024-07-11 NOTE — Progress Notes (Addendum)
 Established Patient Office Visit  Subjective:  Patient ID: Martin Frank, male    DOB: 09-May-1956  Age: 68 y.o. MRN: [id number]  CC:  Chief Complaint  Patient presents with   Follow-up    Patient is here for his 6 month follow-up.      HPI Martin Frank is here for 73-month follow up for chronic disease management.   Hypertension, CAD, CHF: - Medications: carvedilol  3.125 mg BID, lisinopril  2.5 mg daily - Compliance: good - Checking BP at home: no - Denies any SOB, recurrent headaches, CP, vision changes, LE edema, dizziness, palpitations, or medication side effects.    Diabetes: - Checking BG at home: no - Medications: metformin  500 mg BID, Ozempic  0.25 mg/week  - Compliance: good - Eye exam: pt to schedule - Foot exam:  - Microalbumin:  - Denies symptoms of hypoglycemia, polyuria, polydipsia, numbness extremities, foot ulcers/trauma, wounds that are not healing, medication side effects  Lab Results  Component Value Date   HGBA1C 5.8 07/11/2024     Mood disorder: - Diagnosis: depression/anxiety - Treatment: Celexa  40 mg daily, Buspar  10 mg TID PRN - Medication side effects: none - SI/HI: no - Update: Stable. No new concerns.     Hyperlipidemia: - medications: rosuvastatin  40 mg daily - compliance: good - medication SEs: no The ASCVD Risk score (Arnett DK, et al., 2019) failed to calculate for the following reasons:   Risk score cannot be calculated because patient has a medical history suggesting prior/existing ASCVD   * - Cholesterol units were assumed -He is following with cardiology and the lipid clinic    Hepatic cirrhosis: - Patient is following with  GI for weight loss and cirrhosis. Weight loss was suspected to be related to diet changes and reducing alcohol -reports his weight has recently stabilized. He had a colonoscopy and upper GI endoscopy on 08/31/22 with following findings: diverticulosis in the sigmoid colon, internal hemorrhoids,  and patchy moderate inflammation characterized by congestion (edema) and erythema was found in the gastric antrum. Biopsies were taken. Gastric biopsies revealed inflammation and vessel dilation. Liver biopsy was done 09/14/22 with biopsies - cirrhosis (compensated) uncertain etiology (autoimmune vs. MASH or both). [facility] was [facility] [place]. He established with Stephane Quest, NP on 12/06/22. He was advised to reduce risk factors. She suggested considering GLP-1. She advised regular liver US  screenings.  - 12/04/23 update: He was able to start Ozempic  in February 2025 once he changed insurances. Reports tolerating well on 0.25 mg/week, no side effects. He is due to follow-up at the liver clinic this summer.  - 07/11/24 update: He saw liver clinic in June 2025. Ultrasound showed cirrhotic liver morphology without focal hepatic lesion.         07/11/2024    8:09 AM 12/04/2023    8:25 AM 06/06/2023    9:19 AM 02/28/2023    9:08 AM 09/28/2022    2:39 PM  Depression screen PHQ 2/9  Decreased Interest 0 0 0 0 0  Down, Depressed, Hopeless 0 0 1 0 0  PHQ - 2 Score 0 0 1 0 0  Altered sleeping 2 0 1  0  Tired, decreased energy 0 0 2  0  Change in appetite 3 0 0  0  Feeling bad or failure about yourself  0 0 0  0  Trouble concentrating 0 0 1  0  Moving slowly or fidgety/restless 0 0 0  0  Suicidal thoughts 0 0  0  0  PHQ-9 Score 5 0  5   0   Difficult doing work/chores Not difficult at all Not difficult at all Not difficult at all  Not difficult at all     Data saved with a previous flowsheet row definition      07/11/2024    8:10 AM 12/04/2023    8:25 AM 06/06/2023    9:21 AM 06/01/2022    2:44 PM  GAD 7 : Generalized Anxiety Score  Nervous, Anxious, on Edge 1 0 0 3  Control/stop worrying 0 0 0 3  Worry too much - different things 0 0 1 1  Trouble relaxing 0 0 1 1  Restless 0 0 0 0  Easily annoyed or irritable 1 0 0 1  Afraid - awful might happen 0 0 0 3  Total GAD  7 Score 2 0 2 12  Anxiety Difficulty Not difficult at all Not difficult at all Not difficult at all Very difficult     Past Medical History:  Diagnosis Date   Anterior myocardial infarction (HCC) 11/2008   CAD (coronary artery disease)    remote anterior MI with VT and DES to the LAD in May of 2010; NSTEMI July 2014 in [place] with cath showing stent patency, EF of 35 to 40% and possible vasospasm versus potential small vessel disease.    Cardiomyopathy, ischemic 05/07/2013   Chronic ischemic heart disease 04/06/2013   Formatting of this note might be different from the original. STORY: 10/2008 and hospitalized 01/2013 with positive troponins but widely patent arteries on cath. Formatting of this note might be different from the original. STORY: 10/2008 and hospitalized 01/2013 with positive troponins but widely patent arteries on cath. Formatting of this note might be different from the original. STORY: 10/2008 and    Chronic systolic heart failure (HCC)    Depression    Former smoker 12/12/2017   GERD (gastroesophageal reflux disease) 4 years   H/O heart artery stent 05/20/2019   Hepatic cirrhosis (HCC) 10/02/2022   MASH vs autoimmune or both Referral to [facility] [place]   Hx of cardiovascular stress test 2015   Lexiscan  Myoview (11/2013):  Anteroseptal, apical anterior, apex, apical inferior scar; no ischemia, EF 50%; Intermediate Risk   Hyperlipidemia    Hypertension    Ischemic cardiomyopathy    a.Echo (11/2013):  EF 45-50%, apical HK   MI (myocardial infarction) (HCC) ? 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   Type II diabetes mellitus [facility])     Past Surgical History:  Procedure Laterality Date   CARDIAC CATHETERIZATION  X2   CARDIAC VALVE REPLACEMENT     COLONOSCOPY     Multiple-last 2021 history of polyps   CORONARY ANGIOGRAPHY N/A 07/19/2022   Procedure: CORONARY ANGIOGRAPHY;   Surgeon: Wonda Sharper, MD;  Location: [facility] INVASIVE CV LAB;  Service: Cardiovascular;  Laterality: N/A;   CORONARY ANGIOPLASTY WITH STENT PLACEMENT  5/2010X 2   1+1 (12/05/2017)   LAPAROSCOPIC CHOLECYSTECTOMY     LEFT HEART CATH AND CORONARY ANGIOGRAPHY N/A 12/06/2017   Procedure: LEFT HEART CATH AND CORONARY ANGIOGRAPHY;  Surgeon: Court Dorn PARAS, MD;  Location: [facility] INVASIVE CV LAB;  Service: Cardiovascular;  Laterality: N/A;   ORCHIECTOMY Right 1960s    Family History  Problem Relation Age of Onset   Diabetes Mother    Rheum arthritis Mother  CAD Father        MI at age 33   CAD Brother        MI at age 24   Post-traumatic stress disorder Brother    Suicidality Brother    Cancer Brother 46       Brain Tumor   Diabetes Brother    Atrial fibrillation Son    Stomach cancer Neg Hx    Colon cancer Neg Hx    Esophageal cancer Neg Hx     Social History   Socioeconomic History   Marital status: Married    Spouse name: Not on file   Number of children: 2   Years of education: Not on file   Highest education level: Associate degree: academic program  Occupational History   Occupation: Teacher, Adult Education    Comment: Teacher, Adult Education   Occupation: airline pilot  Tobacco Use   Smoking status: Former    Current packs/day: 0.00    Average packs/day: 1 pack/day for 10.0 years (10.0 ttl pk-yrs)    Types: Cigarettes    Start date: 08/01/1971    Quit date: 07/31/1981    Years since quitting: 42.9   Smokeless tobacco: Never  Vaping Use   Vaping status: Never Used  Substance and Sexual Activity   Alcohol use: Not Currently    Alcohol/week: 3.0 standard drinks of alcohol    Types: 3 Glasses of wine per week   Drug use: Not Currently    Types: Marijuana    Comment: 12/05/2017 nothing since my early 20's   Sexual activity: Yes  Other Topics Concern   Not on file  Social History Narrative   Married, 43 years as of 2023   Karna   Employed in water filter sales currently [employer]   Prior  alcohol approximately 7 to 10/week maximum stopped in October 2020   5 caffeinated beverages daily   Former smoker no current tobacco or drug use   Social Drivers of Health   Tobacco Use: Medium Risk (07/11/2024)   Patient History    Smoking Tobacco Use: Former    Smokeless Tobacco Use: Never    Passive Exposure: Not on Actuary Strain: Low Risk (11/27/2023)   Overall Financial Resource Strain (CARDIA)    Difficulty of Paying Living Expenses: Not very hard  Food Insecurity: No Food Insecurity (11/27/2023)   Hunger Vital Sign    Worried About Running Out of Food in the Last Year: Never true    Ran Out of Food in the Last Year: Never true  Transportation Needs: No Transportation Needs (11/27/2023)   PRAPARE - Administrator, Civil Service (Medical): No    Lack of Transportation (Non-Medical): No  Physical Activity: Insufficiently Active (05/20/2024)   Exercise Vital Sign    Days of Exercise per Week: 3 days    Minutes of Exercise per Session: 30 min  Stress: Stress Concern Present (05/20/2024)   [employer] of Occupational Health - Occupational Stress Questionnaire    Feeling of Stress: To some extent  Social Connections: Unknown (05/20/2024)   Social Connection and Isolation Panel    Frequency of Communication with Friends and Family: Not on file    Frequency of Social Gatherings with Friends and Family: Not on file    Attends Religious Services: Not on file    Active Member of Clubs or Organizations: Not on file    Attends Banker Meetings: Not on file    Marital Status: Married  Intimate  Partner Violence: Not At Risk (02/28/2023)   Humiliation, Afraid, Rape, and Kick questionnaire    Fear of Current or Ex-Partner: No    Emotionally Abused: No    Physically Abused: No    Sexually Abused: No  Depression (PHQ2-9): Medium Risk (07/11/2024)   Depression (PHQ2-9)    PHQ-2 Score: 5  Alcohol Screen: Low Risk (11/27/2023)   Alcohol  Screen    Last Alcohol Screening Score (AUDIT): 2  Housing: Unknown (11/27/2023)   Housing Stability Vital Sign    Unable to Pay for Housing in the Last Year: No    Number of Times Moved in the Last Year: Not on file    Homeless in the Last Year: No  Utilities: Low Risk (12/06/2022)   Received from [facility]   Utilities    In the past 12 months has the electric, gas, oil, or water company threatened to shut off services in your home? : No  Health Literacy: Adequate Health Literacy (02/28/2023)   B1300 Health Literacy    Frequency of need for help with medical instructions: Never    ROS All ROS negative except what is listed in the HPI.   Objective:   Today's Vitals: BP 128/85 (BP Location: Right Arm, Patient Position: Sitting, Cuff Size: Normal)   Pulse (!) 59   Temp 98 F (36.7 C) (Oral)   Resp 16   Ht 6' 2 (1.88 m)   Wt 217 lb 9.6 oz (98.7 kg)   SpO2 99%   BMI 27.94 kg/m   Physical Exam Vitals reviewed.  Constitutional:      General: He is not in acute distress.    Appearance: Normal appearance. He is not ill-appearing.  Cardiovascular:     Rate and Rhythm: Normal rate and regular rhythm.     Pulses: Normal pulses.     Heart sounds: Normal heart sounds.  Pulmonary:     Effort: Pulmonary effort is normal.     Breath sounds: Normal breath sounds.  Musculoskeletal:     Cervical back: Normal range of motion and neck supple.     Right lower leg: No edema.     Left lower leg: No edema.  Skin:    General: Skin is warm and dry.  Neurological:     Mental Status: He is alert and oriented to person, place, and time.  Psychiatric:        Mood and Affect: Mood normal.        Behavior: Behavior normal.        Thought Content: Thought content normal.        Judgment: Judgment normal.       Assessment & Plan:   Problem List Items Addressed This Visit       Active Problems   CAD (coronary artery disease) - Primary (Chronic)   Following with cardiology No acute  concerns.  Continue current regimen         Hyperlipidemia (Chronic)   Currently on rosuvastatin  40 mg daily Following with cardiology  Lifestyle factors for lowering cholesterol include: Diet therapy - heart-healthy diet rich in fruits, veggies, fiber-rich whole grains, lean meats, chicken, fish (at least twice a week), fat-free or 1% dairy products; foods low in saturated/trans fats, cholesterol, sodium, and sugar. Mediterranean diet has shown to be very heart healthy. Regular exercise - recommend at least 30 minutes a day, 5 times per week Weight management        Relevant Orders   Comprehensive metabolic panel with  GFR (Completed)   Lipid panel (Completed)   Type 2 diabetes mellitus with other specified complication (HCC) (Chronic)   Complications and associated diagnoses: hypertension, hyperlipidemia, coronary artery disease Stable. Recheck A1c today. Continue metformin , Ozempic  and lifestyle measures        Relevant Orders   Hemoglobin A1c (Completed)   Microalbumin / creatinine urine ratio (Completed)   Essential hypertension (Chronic)   Blood pressure is at goal for age and co-morbidities.   Recommendations: continue coreg  and lisinopril  - BP goal <130/80 - monitor and log blood pressures at home - check around the same time each day in a relaxed setting - Limit salt to <2000 mg/day - Follow DASH eating plan (heart healthy diet) - limit alcohol to 2 standard drinks per day for men and 1 per day for women - avoid tobacco products - get at least 2 hours of regular aerobic exercise weekly Patient aware of signs/symptoms requiring further/urgent evaluation.        Relevant Orders   CBC with Differential/Platelet (Completed)   Comprehensive metabolic panel with GFR (Completed)   Lipid panel (Completed)   TSH (Completed)   MDD (major depressive disorder), recurrent episode, moderate (HCC) (Chronic)   On Celexa  and Buspar . Reports feeling well. No counseling  needed. No SI/HI - Continue current medication regimen.      Hepatic cirrhosis (HCC) (Chronic)   Following with [facility] in [place] - recent liver US  stable. Following with them again in January.  Aware of lifestyle modification       Chronic systolic (congestive) heart failure (HCC)   Euvolemic. Stable on current regimen. Following with cardiology.       Other Visit Diagnoses       Needs flu shot       Relevant Orders   Flu vaccine HIGH DOSE PF(Fluzone Trivalent) (Completed)        Follow-up: Return in about 6 months (around 01/09/2025) for physical.   Waddell B. Almarie, DNP, FNP-C  I,Emily Lagle,acting as a neurosurgeon for Waddell KATHEE Almarie, NP.,have documented all relevant documentation on the behalf of Waddell KATHEE Almarie, NP.  I, Waddell KATHEE Almarie, NP, have reviewed all documentation for this visit. The documentation on 07/11/2024 for the exam, diagnosis, procedures, and orders are all accurate and complete.

## 2024-07-11 NOTE — Assessment & Plan Note (Signed)
Following with cardiology No acute concerns.  Continue current regimen

## 2024-07-11 NOTE — Assessment & Plan Note (Signed)
 Euvolemic. Stable on current regimen. Following with cardiology.

## 2024-07-11 NOTE — Assessment & Plan Note (Signed)
 On Celexa  and Buspar . Reports feeling well. No counseling needed. No SI/HI - Continue current medication regimen.

## 2024-07-11 NOTE — Assessment & Plan Note (Signed)
 Currently on rosuvastatin  40 mg daily Following with cardiology  Lifestyle factors for lowering cholesterol include: Diet therapy - heart-healthy diet rich in fruits, veggies, fiber-rich whole grains, lean meats, chicken, fish (at least twice a week), fat-free or 1% dairy products; foods low in saturated/trans fats, cholesterol, sodium, and sugar. Mediterranean diet has shown to be very heart healthy. Regular exercise - recommend at least 30 minutes a day, 5 times per week Weight management

## 2024-07-11 NOTE — Assessment & Plan Note (Signed)
Blood pressure is at goal for age and co-morbidities.   Recommendations: continue coreg and lisinopril - BP goal <130/80 - monitor and log blood pressures at home - check around the same time each day in a relaxed setting - Limit salt to <2000 mg/day - Follow DASH eating plan (heart healthy diet) - limit alcohol to 2 standard drinks per day for men and 1 per day for women - avoid tobacco products - get at least 2 hours of regular aerobic exercise weekly Patient aware of signs/symptoms requiring further/urgent evaluation.

## 2024-07-11 NOTE — Assessment & Plan Note (Signed)
 Stable. Recheck A1c today. Continue metformin , Ozempic  and lifestyle measures

## 2024-07-11 NOTE — Assessment & Plan Note (Signed)
 Following with [facility] in [place] - recent liver US  stable. Following with them again in January.  Aware of lifestyle modification

## 2024-07-14 ENCOUNTER — Ambulatory Visit: Payer: Self-pay | Admitting: Family Medicine

## 2024-07-16 ENCOUNTER — Telehealth: Payer: Self-pay | Admitting: Family Medicine

## 2024-07-16 NOTE — Telephone Encounter (Signed)
 Copied from CRM #[id number]. Topic: Medicare AWV >> Jul 16, 2024 10:09 AM Nathanel DEL wrote: Called LVM 07/16/2024 to sched AWV. Please schedule AWV in office only.   Nathanel Paschal; Care Guide Ambulatory Clinical Support [place] [facility] Direct Dial: [phone]

## 2024-07-22 ENCOUNTER — Encounter: Payer: Self-pay | Admitting: Family Medicine

## 2024-07-25 ENCOUNTER — Encounter: Payer: Self-pay | Admitting: Family Medicine

## 2024-07-25 DIAGNOSIS — E1169 Type 2 diabetes mellitus with other specified complication: Secondary | ICD-10-CM

## 2024-07-28 ENCOUNTER — Other Ambulatory Visit: Payer: Self-pay | Admitting: Family Medicine

## 2024-07-28 ENCOUNTER — Other Ambulatory Visit (HOSPITAL_COMMUNITY): Payer: Self-pay

## 2024-07-28 DIAGNOSIS — E1169 Type 2 diabetes mellitus with other specified complication: Secondary | ICD-10-CM

## 2024-07-28 MED ORDER — OZEMPIC (0.25 OR 0.5 MG/DOSE) 2 MG/3ML ~~LOC~~ SOPN
0.2500 mg | PEN_INJECTOR | SUBCUTANEOUS | 2 refills | Status: DC
  Filled 2024-07-28: qty 3, fill #0

## 2024-07-28 MED ORDER — OZEMPIC (0.25 OR 0.5 MG/DOSE) 2 MG/3ML ~~LOC~~ SOPN: 0.2500 mg | PEN_INJECTOR | SUBCUTANEOUS | 2 refills | Status: AC

## 2024-07-30 ENCOUNTER — Other Ambulatory Visit: Payer: Self-pay | Admitting: Family Medicine

## 2024-07-30 DIAGNOSIS — F331 Major depressive disorder, recurrent, moderate: Secondary | ICD-10-CM

## 2024-08-02 ENCOUNTER — Encounter: Payer: Self-pay | Admitting: Family Medicine

## 2024-08-04 ENCOUNTER — Encounter: Payer: Self-pay | Admitting: Cardiology

## 2024-08-04 ENCOUNTER — Ambulatory Visit (INDEPENDENT_AMBULATORY_CARE_PROVIDER_SITE_OTHER): Admitting: *Deleted

## 2024-08-04 VITALS — BP 116/78 | HR 67 | Temp 98.6°F | Resp 16 | Ht 74.0 in | Wt 217.4 lb

## 2024-08-04 DIAGNOSIS — Z0000 Encounter for general adult medical examination without abnormal findings: Secondary | ICD-10-CM | POA: Diagnosis not present

## 2024-08-04 NOTE — Patient Instructions (Addendum)
 Mr. Barbaro,  Thank you for taking the time for your Medicare Wellness Visit. I appreciate your continued commitment to your health goals. Please review the care plan we discussed, and feel free to reach out if I can assist you further.  Please note that Annual Wellness Visits do not include a physical exam. Some assessments may be limited, especially if the visit was conducted virtually. If needed, we may recommend an in-person follow-up with your provider.  Goal: To join Silver  Sneakers and exercise 5 days a week, 30 min each day   Ongoing Care Seeing your primary care provider every 3 to 6 months helps us  monitor your health and provide consistent, personalized care.   Waddell Mon, NP: 02/03/25 8:20am Medicare AWV: 08/05/25 9:40am, in person  Referrals If a referral was made during today's visit and you haven't received any updates within two weeks, please contact the referred provider directly to check on the status.  Diabetic Eye Exam:  Please complete as scheduled and ask them to send us  a copy of your result.  Recommended Screenings:  Health Maintenance  Topic Date Due   Eye exam for diabetics  Never done   Complete foot exam   02/28/2024   Medicare Annual Wellness Visit  02/28/2024   Hemoglobin A1C  01/09/2025   Yearly kidney function blood test for diabetes  07/11/2025   Yearly kidney health urinalysis for diabetes  07/11/2025   Colon Cancer Screening  09/01/2027   Pneumococcal Vaccine for age over 23  Completed   Flu Shot  Completed   Hepatitis C Screening  Completed   Meningitis B Vaccine  Aged Out   DTaP/Tdap/Td vaccine  Discontinued   COVID-19 Vaccine  Discontinued   Zoster (Shingles) Vaccine  Discontinued       07/28/2024    1:50 PM  Advanced Directives  Does Patient Have a Medical Advance Directive? No  Would patient like information on creating a medical advance directive? Yes (MAU/Ambulatory/Procedural Areas - Information given)  Once completed and notarized,  you may return a copy of your Advanced Directive(s) by either of the following:  Bring a copy of your health care power of attorney and living will to the office to be added to your chart at your convenience. You can mail a copy to [employer] [address] [place], [place] [address] or email to [email or web address]   Vision: Annual vision screenings are recommended for early detection of glaucoma, cataracts, and diabetic retinopathy. These exams can also reveal signs of chronic conditions such as diabetes and high blood pressure.  Dental: Annual dental screenings help detect early signs of oral cancer, gum disease, and other conditions linked to overall health, including heart disease and diabetes.  Please see the attached documents for additional preventive care recommendations.

## 2024-08-04 NOTE — Progress Notes (Signed)
 "  Chief Complaint  Patient presents with   Medicare Wellness     Subjective:   Martin Frank is a 69 y.o. male who presents for a Medicare Annual Wellness Visit.  Visit info / Clinical Intake: Medicare Wellness Visit Type:: Subsequent Annual Wellness Visit Persons participating in visit and providing information:: patient Medicare Wellness Visit Mode:: In-person (required for WTM) Interpreter Needed?: No Pre-visit prep was completed: yes AWV questionnaire completed by patient prior to visit?: yes Date:: 07/28/24 Living arrangements:: (Patient-Rptd) lives with spouse/significant other Patient's Overall Health Status Rating: (!) (Patient-Rptd) fair Typical amount of pain: (Patient-Rptd) none Does pain affect daily life?: (Patient-Rptd) no Are you currently prescribed opioids?: no  Dietary Habits and Nutritional Risks How many meals a day?: (Patient-Rptd) 3 Eats fruit and vegetables daily?: (Patient-Rptd) yes Most meals are obtained by: (Patient-Rptd) preparing own meals In the last 2 weeks, have you had any of the following?: none Diabetic:: (!) yes Any non-healing wounds?: no How often do you check your BS?: 0 Would you like to be referred to a Nutritionist or for Diabetic Management? : no  Functional Status Activities of Daily Living (to include ambulation/medication): (Patient-Rptd) Independent Ambulation: (Patient-Rptd) Independent Medication Administration: (Patient-Rptd) Independent Home Management (perform basic housework or laundry): Dependent (spouse manages but pt could if he needed to) Manage your own finances?: (Patient-Rptd) yes Primary transportation is: (Patient-Rptd) driving Concerns about vision?: no *vision screening is required for WTM* (Pt is scheduled in the next couple of weeks but can't remember the name as it is a new group.) Concerns about hearing?: no  Fall Screening Falls in the past year?: (Patient-Rptd) 0 Number of falls in past year: 0 Was  there an injury with Fall?: 0 Fall Risk Category Calculator: 0 Patient Fall Risk Level: Low Fall Risk  Fall Risk Patient at Risk for Falls Due to: No Fall Risks Fall risk Follow up: Falls evaluation completed  Home and Transportation Safety: All rugs have non-skid backing?: (Patient-Rptd) yes All stairs or steps have railings?: (Patient-Rptd) yes Grab bars in the bathtub or shower?: (Patient-Rptd) yes Have non-skid surface in bathtub or shower?: (Patient-Rptd) yes Good home lighting?: (Patient-Rptd) yes Regular seat belt use?: (Patient-Rptd) yes Hospital stays in the last year:: (Patient-Rptd) no  Cognitive Assessment Difficulty concentrating, remembering, or making decisions? : yes (has started taking Prevagen as he feels memory isn't as good as it used to be.) Will 6CIT or Mini Cog be Completed: yes What year is it?: 0 points What month is it?: 0 points Give patient an address phrase to remember (5 components): [address], [employer] what time is it?: 0 points Count backwards from 20 to 1: 0 points Say the months of the year in reverse: 0 points Repeat the address phrase from earlier: 0 points 6 CIT Score: 0 points  Advance Directives (For Healthcare) Does Patient Have a Medical Advance Directive?: No Would patient like information on creating a medical advance directive?: Yes (MAU/Ambulatory/Procedural Areas - Information given)  Reviewed/Updated  Reviewed/Updated: Reviewed All (Medical, Surgical, Family, Medications, Allergies, Care Teams, Patient Goals)    Allergies (verified) Bee venom   Current Medications (verified) Outpatient Encounter Medications as of 08/04/2024  Medication Sig   acetaminophen  (TYLENOL ) 500 MG tablet Take 500 mg by mouth 2 (two) times daily as needed for moderate pain or headache.   aspirin  EC 81 MG tablet Take 1 tablet (81 mg total) by mouth daily. Swallow whole.   busPIRone  (BUSPAR ) 10 MG tablet TAKE 1 TABLET(10  MG) BY  MOUTH THREE TIMES DAILY AS NEEDED   carvedilol  (COREG ) 3.125 MG tablet Take 1 tablet (3.125 mg total) by mouth 2 (two) times daily with a meal.   citalopram  (CELEXA ) 40 MG tablet Take 1 tablet (40 mg total) by mouth daily.   lisinopril  (ZESTRIL ) 2.5 MG tablet Take 1 tablet (2.5 mg total) by mouth daily.   metFORMIN  (GLUCOPHAGE ) 500 MG tablet TAKE 1 TABLET BY MOUTH TWICE A DAY WITH FOOD   nitroGLYCERIN  (NITROSTAT ) 0.4 MG SL tablet Place 1 tablet (0.4 mg total) under the tongue every 5 (five) minutes as needed for chest pain (may repeat X 3 than call 911).   pantoprazole  (PROTONIX ) 40 MG tablet Take 1 tablet (40 mg total) by mouth daily.   rosuvastatin  (CRESTOR ) 40 MG tablet TAKE 1 TABLET BY MOUTH EVERY DAY   Semaglutide ,0.25 or 0.5MG /DOS, (OZEMPIC , 0.25 OR 0.5 MG/DOSE,) 2 MG/3ML SOPN Inject 0.25 mg into the skin once a week.   No facility-administered encounter medications on file as of 08/04/2024.    History: Past Medical History:  Diagnosis Date   Anterior myocardial infarction (HCC) 11/2008   CAD (coronary artery disease)    remote anterior MI with VT and DES to the LAD in May of 2010; NSTEMI July 2014 in [place] with cath showing stent patency, EF of 35 to 40% and possible vasospasm versus potential small vessel disease.    Cardiomyopathy, ischemic 05/07/2013   Chronic ischemic heart disease 04/06/2013   Formatting of this note might be different from the original. STORY: 10/2008 and hospitalized 01/2013 with positive troponins but widely patent arteries on cath. Formatting of this note might be different from the original. STORY: 10/2008 and hospitalized 01/2013 with positive troponins but widely patent arteries on cath. Formatting of this note might be different from the original. 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   Type II diabetes mellitus [facility])    Past Surgical History:  Procedure Laterality Date   CARDIAC CATHETERIZATION  X2   CARDIAC VALVE REPLACEMENT     COLONOSCOPY     Multiple-last 2021 history of polyps   CORONARY ANGIOGRAPHY N/A 07/19/2022   Procedure: CORONARY ANGIOGRAPHY;  Surgeon: Wonda Sharper, MD;  Location: [facility] INVASIVE CV LAB;  Service: Cardiovascular;  Laterality: N/A;   CORONARY ANGIOPLASTY WITH STENT PLACEMENT  5/2010X 2   1+1 (12/05/2017)   LAPAROSCOPIC CHOLECYSTECTOMY     LEFT HEART CATH AND CORONARY ANGIOGRAPHY N/A 12/06/2017   Procedure: LEFT HEART CATH AND CORONARY ANGIOGRAPHY;  Surgeon: Court Dorn PARAS, MD;  Location: [facility] INVASIVE CV LAB;  Service: Cardiovascular;  Laterality: N/A;   ORCHIECTOMY Right 1960s   Family History  Problem Relation Age of Onset   Diabetes Mother  Rheum arthritis Mother    CAD Father        MI at age 23   CAD Brother        MI at age 75   Post-traumatic stress disorder Brother    Suicidality Brother    Cancer Brother 34       Brain Tumor   Diabetes Brother    Atrial fibrillation Son    Stomach cancer Neg Hx    Colon cancer Neg Hx    Esophageal cancer Neg Hx    Social History   Occupational History   Occupation: Teacher, Adult Education    Comment: Teacher, Adult Education   Occupation: airline pilot  Tobacco Use   Smoking status: Former    Current packs/day: 0.00    Average packs/day: 1 pack/day for 10.0 years (10.0 ttl pk-yrs)    Types:  Cigarettes    Start date: 08/01/1971    Quit date: 07/31/1981    Years since quitting: 43.0   Smokeless tobacco: Never  Vaping Use   Vaping status: Never Used  Substance and Sexual Activity   Alcohol use: Not Currently    Alcohol/week: 3.0 standard drinks of alcohol    Types: 3 Glasses of wine per week   Drug use: Not Currently    Types: Marijuana    Comment: 12/05/2017 nothing since my early 20's   Sexual activity: Yes   Tobacco Counseling Counseling given: Not Answered  SDOH Screenings   Food Insecurity: No Food Insecurity (08/04/2024)  Housing: Low Risk (08/04/2024)  Transportation Needs: No Transportation Needs (08/04/2024)  Utilities: Not At Risk (08/04/2024)  Alcohol Screen: Low Risk (11/27/2023)  Depression (PHQ2-9): Medium Risk (08/04/2024)  Financial Resource Strain: Low Risk (11/27/2023)  Physical Activity: Insufficiently Active (08/04/2024)  Social Connections: Socially Integrated (08/04/2024)  Stress: No Stress Concern Present (08/04/2024)  Recent Concern: Stress - Stress Concern Present (05/20/2024)  Tobacco Use: Medium Risk (08/04/2024)  Health Literacy: Adequate Health Literacy (02/28/2023)   See flowsheets for full screening details  Depression Screen PHQ 2 & 9 Depression Scale- Over the past 2 weeks, how often have you been bothered by any of the following problems? Little interest or pleasure in doing things: 0 Feeling down, depressed, or hopeless (PHQ Adolescent also includes...irritable): 0 PHQ-2 Total Score: 0 Trouble falling or staying asleep, or sleeping too much: 2 Feeling tired or having little energy: 0 Poor appetite or overeating (PHQ Adolescent also includes...weight loss): 3 (Currently on Ozempic  and causes decrease in appetite) Feeling bad about yourself - or that you are a failure or have let yourself or your family down: 0 Trouble concentrating on things, such as reading the newspaper or watching television (PHQ Adolescent also includes...like school work):  0 Moving or speaking so slowly that other people could have noticed. Or the opposite - being so fidgety or restless that you have been moving around a lot more than usual: 0 Thoughts that you would be better off dead, or of hurting yourself in some way: 0 PHQ-9 Total Score: 5 If you checked off any problems, how difficult have these problems made it for you to do your work, take care of things at home, or get along with other people?: Not difficult at all     Goals Addressed             This Visit's Progress    To join Silver  Sneakers and exercise 5 days a week, 30 min each day  Objective:    Today's Vitals   08/04/24 0938  BP: 116/78  Pulse: 67  Resp: 16  Temp: 98.6 F (37 C)  TempSrc: Oral  SpO2: 99%  Weight: 217 lb 6.4 oz (98.6 kg)  Height: 6' 2 (1.88 m)   Body mass index is 27.91 kg/m.  Hearing/Vision screen No results found. Immunizations and Health Maintenance Health Maintenance  Topic Date Due   OPHTHALMOLOGY EXAM  Never done   FOOT EXAM  02/28/2024   HEMOGLOBIN A1C  01/09/2025   Diabetic kidney evaluation - eGFR measurement  07/11/2025   Diabetic kidney evaluation - Urine ACR  07/11/2025   Medicare Annual Wellness (AWV)  08/04/2025   Colonoscopy  09/01/2027   Pneumococcal Vaccine: 50+ Years  Completed   Influenza Vaccine  Completed   Hepatitis C Screening  Completed   Meningococcal B Vaccine  Aged Out   DTaP/Tdap/Td  Discontinued   COVID-19 Vaccine  Discontinued   Zoster Vaccines- Shingrix  Discontinued        Assessment/Plan:  This is a routine wellness examination for Martin Frank.  Patient Care Team: Almarie Waddell NOVAK, NP as PCP - General (Family Medicine) Pietro Redell RAMAN, MD as PCP - Cardiology (Cardiology)  I have personally reviewed and noted the following in the patients chart:   Medical and social history Use of alcohol, tobacco or illicit drugs  Current medications and supplements including opioid  prescriptions. Functional ability and status Nutritional status Physical activity Advanced directives List of other physicians Hospitalizations, surgeries, and ER visits in previous 12 months Vitals Screenings to include cognitive, depression, and falls Referrals and appointments  No orders of the defined types were placed in this encounter.  In addition, I have reviewed and discussed with patient certain preventive protocols, quality metrics, and best practice recommendations. A written personalized care plan for preventive services as well as general preventive health recommendations were provided to patient.   Lolita Libra, CMA   08/04/2024   Return in 1 year (on 08/04/2025).  After Visit Summary: (In Person-Printed) AVS printed and given to the patient  Nurse Notes: HM Addressed: Covid vaccine declined. Has DM eye exam scheduled.  "

## 2024-08-07 ENCOUNTER — Other Ambulatory Visit: Payer: Self-pay | Admitting: Nurse Practitioner

## 2024-08-07 DIAGNOSIS — K7469 Other cirrhosis of liver: Secondary | ICD-10-CM

## 2024-08-07 DIAGNOSIS — K760 Fatty (change of) liver, not elsewhere classified: Secondary | ICD-10-CM

## 2024-08-07 DIAGNOSIS — K3189 Other diseases of stomach and duodenum: Secondary | ICD-10-CM

## 2024-08-15 ENCOUNTER — Other Ambulatory Visit: Payer: Self-pay | Admitting: Family Medicine

## 2024-08-15 DIAGNOSIS — I2510 Atherosclerotic heart disease of native coronary artery without angina pectoris: Secondary | ICD-10-CM

## 2024-08-15 DIAGNOSIS — I10 Essential (primary) hypertension: Secondary | ICD-10-CM

## 2024-08-19 ENCOUNTER — Ambulatory Visit
Admission: RE | Admit: 2024-08-19 | Discharge: 2024-08-19 | Disposition: A | Source: Ambulatory Visit | Attending: Nurse Practitioner

## 2024-08-19 DIAGNOSIS — K760 Fatty (change of) liver, not elsewhere classified: Secondary | ICD-10-CM

## 2024-08-19 DIAGNOSIS — K7469 Other cirrhosis of liver: Secondary | ICD-10-CM

## 2024-08-19 DIAGNOSIS — K3189 Other diseases of stomach and duodenum: Secondary | ICD-10-CM

## 2024-08-26 ENCOUNTER — Other Ambulatory Visit: Payer: Self-pay | Admitting: Family Medicine

## 2024-08-26 DIAGNOSIS — F331 Major depressive disorder, recurrent, moderate: Secondary | ICD-10-CM

## 2024-08-28 ENCOUNTER — Encounter: Payer: Self-pay | Admitting: Family Medicine

## 2024-08-28 DIAGNOSIS — K219 Gastro-esophageal reflux disease without esophagitis: Secondary | ICD-10-CM

## 2024-08-28 DIAGNOSIS — F331 Major depressive disorder, recurrent, moderate: Secondary | ICD-10-CM

## 2024-08-28 MED ORDER — BUSPIRONE HCL 10 MG PO TABS: 10.0000 mg | ORAL_TABLET | Freq: Three times a day (TID) | ORAL | 1 refills | Status: AC | PRN

## 2024-08-28 MED ORDER — PANTOPRAZOLE SODIUM 40 MG PO TBEC: 40.0000 mg | DELAYED_RELEASE_TABLET | Freq: Every day | ORAL | 1 refills | Status: AC

## 2024-08-28 MED ORDER — CITALOPRAM HYDROBROMIDE 40 MG PO TABS: 40.0000 mg | ORAL_TABLET | Freq: Every day | ORAL | 1 refills | Status: DC

## 2024-09-04 ENCOUNTER — Other Ambulatory Visit: Payer: Self-pay | Admitting: Family Medicine

## 2024-09-04 DIAGNOSIS — F331 Major depressive disorder, recurrent, moderate: Secondary | ICD-10-CM

## 2025-02-03 ENCOUNTER — Encounter: Admitting: Family Medicine

## 2025-08-05 ENCOUNTER — Ambulatory Visit
